# Patient Record
Sex: Female | Born: 1935 | Race: Black or African American | Hispanic: No | Smoking: Never smoker
Health system: Southern US, Community
[De-identification: ages and names within clinical notes are randomized; demographics above are authoritative.]

## PROBLEM LIST (undated history)

## (undated) DIAGNOSIS — M1990 Unspecified osteoarthritis, unspecified site: Secondary | ICD-10-CM

## (undated) DIAGNOSIS — I10 Essential (primary) hypertension: Secondary | ICD-10-CM

## (undated) HISTORY — PX: ABDOMINAL HYSTERECTOMY: SHX81

---

## 2000-05-24 ENCOUNTER — Other Ambulatory Visit: Admission: RE | Admit: 2000-05-24 | Discharge: 2000-05-24 | Payer: Self-pay | Admitting: Family Medicine

## 2000-06-21 ENCOUNTER — Encounter: Admission: RE | Admit: 2000-06-21 | Discharge: 2000-06-21 | Payer: Self-pay | Admitting: Family Medicine

## 2000-06-21 ENCOUNTER — Encounter: Payer: Self-pay | Admitting: Family Medicine

## 2001-06-23 ENCOUNTER — Encounter: Payer: Self-pay | Admitting: Family Medicine

## 2001-06-23 ENCOUNTER — Encounter: Admission: RE | Admit: 2001-06-23 | Discharge: 2001-06-23 | Payer: Self-pay | Admitting: Family Medicine

## 2002-08-17 ENCOUNTER — Encounter: Payer: Self-pay | Admitting: Family Medicine

## 2002-08-17 ENCOUNTER — Encounter: Admission: RE | Admit: 2002-08-17 | Discharge: 2002-08-17 | Payer: Self-pay | Admitting: Family Medicine

## 2003-09-20 ENCOUNTER — Encounter: Admission: RE | Admit: 2003-09-20 | Discharge: 2003-09-20 | Payer: Self-pay | Admitting: Family Medicine

## 2004-10-03 ENCOUNTER — Encounter: Admission: RE | Admit: 2004-10-03 | Discharge: 2004-10-03 | Payer: Self-pay | Admitting: Family Medicine

## 2005-10-24 ENCOUNTER — Encounter: Admission: RE | Admit: 2005-10-24 | Discharge: 2005-10-24 | Payer: Self-pay | Admitting: Family Medicine

## 2006-03-26 ENCOUNTER — Other Ambulatory Visit: Admission: RE | Admit: 2006-03-26 | Discharge: 2006-03-26 | Payer: Self-pay | Admitting: Family Medicine

## 2006-11-06 ENCOUNTER — Encounter: Admission: RE | Admit: 2006-11-06 | Discharge: 2006-11-06 | Payer: Self-pay | Admitting: Family Medicine

## 2007-11-20 ENCOUNTER — Encounter: Admission: RE | Admit: 2007-11-20 | Discharge: 2007-11-20 | Payer: Self-pay | Admitting: Family Medicine

## 2008-11-24 ENCOUNTER — Encounter: Admission: RE | Admit: 2008-11-24 | Discharge: 2008-11-24 | Payer: Self-pay | Admitting: Family Medicine

## 2009-12-07 ENCOUNTER — Encounter: Admission: RE | Admit: 2009-12-07 | Discharge: 2009-12-07 | Payer: Self-pay | Admitting: Family Medicine

## 2010-12-05 ENCOUNTER — Other Ambulatory Visit: Payer: Self-pay | Admitting: Family Medicine

## 2010-12-05 DIAGNOSIS — Z1231 Encounter for screening mammogram for malignant neoplasm of breast: Secondary | ICD-10-CM

## 2010-12-11 ENCOUNTER — Ambulatory Visit
Admission: RE | Admit: 2010-12-11 | Discharge: 2010-12-11 | Disposition: A | Discharge status: Home / Self Care | Payer: BC Managed Care – PPO | Source: Ambulatory Visit | Attending: Family Medicine | Admitting: Family Medicine

## 2010-12-11 DIAGNOSIS — Z1231 Encounter for screening mammogram for malignant neoplasm of breast: Secondary | ICD-10-CM

## 2011-01-25 ENCOUNTER — Encounter: Payer: Self-pay | Admitting: Podiatry

## 2011-01-25 DIAGNOSIS — E119 Type 2 diabetes mellitus without complications: Secondary | ICD-10-CM | POA: Insufficient documentation

## 2011-08-06 DIAGNOSIS — N181 Chronic kidney disease, stage 1: Secondary | ICD-10-CM | POA: Diagnosis not present

## 2011-08-06 DIAGNOSIS — I10 Essential (primary) hypertension: Secondary | ICD-10-CM | POA: Diagnosis not present

## 2011-08-06 DIAGNOSIS — E785 Hyperlipidemia, unspecified: Secondary | ICD-10-CM | POA: Diagnosis not present

## 2011-08-06 DIAGNOSIS — E1129 Type 2 diabetes mellitus with other diabetic kidney complication: Secondary | ICD-10-CM | POA: Diagnosis not present

## 2011-08-07 ENCOUNTER — Emergency Department (HOSPITAL_COMMUNITY): Payer: No Typology Code available for payment source

## 2011-08-07 ENCOUNTER — Emergency Department (HOSPITAL_COMMUNITY)
Admission: EM | Admit: 2011-08-07 | Discharge: 2011-08-07 | Disposition: A | Discharge status: Home / Self Care | Payer: No Typology Code available for payment source | Attending: Emergency Medicine | Admitting: Emergency Medicine

## 2011-08-07 DIAGNOSIS — R109 Unspecified abdominal pain: Secondary | ICD-10-CM | POA: Insufficient documentation

## 2011-08-07 DIAGNOSIS — I10 Essential (primary) hypertension: Secondary | ICD-10-CM | POA: Insufficient documentation

## 2011-08-07 DIAGNOSIS — M545 Low back pain, unspecified: Secondary | ICD-10-CM | POA: Diagnosis not present

## 2011-08-07 DIAGNOSIS — R079 Chest pain, unspecified: Secondary | ICD-10-CM | POA: Insufficient documentation

## 2011-08-07 DIAGNOSIS — E119 Type 2 diabetes mellitus without complications: Secondary | ICD-10-CM | POA: Insufficient documentation

## 2011-08-07 DIAGNOSIS — S335XXA Sprain of ligaments of lumbar spine, initial encounter: Secondary | ICD-10-CM | POA: Insufficient documentation

## 2011-08-07 DIAGNOSIS — S39012A Strain of muscle, fascia and tendon of lower back, initial encounter: Secondary | ICD-10-CM

## 2011-08-07 HISTORY — DX: Essential (primary) hypertension: I10

## 2011-08-07 LAB — POCT I-STAT, CHEM 8
Calcium, Ion: 1.24 mmol/L (ref 1.12–1.32)
Glucose, Bld: 166 mg/dL — ABNORMAL HIGH (ref 70–99)
HCT: 44 % (ref 36.0–46.0)
TCO2: 29 mmol/L (ref 0–100)

## 2011-08-07 LAB — GLUCOSE, CAPILLARY: Glucose-Capillary: 124 mg/dL — ABNORMAL HIGH (ref 70–99)

## 2011-08-07 LAB — CBC
MCH: 29.1 pg (ref 26.0–34.0)
MCHC: 33.3 g/dL (ref 30.0–36.0)
MCV: 87.6 fL (ref 78.0–100.0)
Platelets: 209 10*3/uL (ref 150–400)
RDW: 13.6 % (ref 11.5–15.5)

## 2011-08-07 LAB — DIFFERENTIAL
Basophils Relative: 1 % (ref 0–1)
Eosinophils Absolute: 0.3 10*3/uL (ref 0.0–0.7)
Eosinophils Relative: 4 % (ref 0–5)
Neutrophils Relative %: 42 % — ABNORMAL LOW (ref 43–77)

## 2011-08-07 LAB — PROTIME-INR
INR: 0.98 (ref 0.00–1.49)
Prothrombin Time: 13.2 seconds (ref 11.6–15.2)

## 2011-08-07 MED ORDER — IOHEXOL 300 MG/ML  SOLN
100.0000 mL | Freq: Once | INTRAMUSCULAR | Status: AC | PRN
  Administered 2011-08-07: 100 mL via INTRAVENOUS
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
## 2011-08-07 NOTE — ED Notes (Signed)
Patient transported to RADIOLOGY

## 2011-08-07 NOTE — ED Notes (Signed)
See trauma narrator 

## 2011-08-07 NOTE — ED Notes (Signed)
Pt returned from radiology.
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
## 2011-08-07 NOTE — Progress Notes (Signed)
Trauma page: chaplain spoke briefly with patient who stated that she is coping okay. Chaplain offered to contact any friends or family on her behalf, but patient said she had already done so. Follow up as needed.

## 2011-08-07 NOTE — ED Notes (Signed)
Unable per pt none local.
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
## 2011-08-07 NOTE — ED Notes (Signed)
Patient ambulated to restroom nad noted, returned from ct/xray. Vs as documented. Sitting on side of bed, nad noted,

## 2011-08-16 DIAGNOSIS — B351 Tinea unguium: Secondary | ICD-10-CM | POA: Diagnosis not present

## 2011-11-07 DIAGNOSIS — A088 Other specified intestinal infections: Secondary | ICD-10-CM | POA: Diagnosis not present

## 2011-11-07 DIAGNOSIS — Z1331 Encounter for screening for depression: Secondary | ICD-10-CM | POA: Diagnosis not present

## 2011-11-09 ENCOUNTER — Other Ambulatory Visit: Payer: Self-pay | Admitting: Family Medicine

## 2011-11-09 DIAGNOSIS — Z1231 Encounter for screening mammogram for malignant neoplasm of breast: Secondary | ICD-10-CM

## 2011-11-15 DIAGNOSIS — B351 Tinea unguium: Secondary | ICD-10-CM | POA: Diagnosis not present

## 2012-01-04 ENCOUNTER — Ambulatory Visit
Admission: RE | Admit: 2012-01-04 | Discharge: 2012-01-04 | Disposition: A | Discharge status: Home / Self Care | Payer: BC Managed Care – PPO | Source: Ambulatory Visit | Attending: Family Medicine | Admitting: Family Medicine

## 2012-01-04 DIAGNOSIS — Z1231 Encounter for screening mammogram for malignant neoplasm of breast: Secondary | ICD-10-CM

## 2012-02-07 DIAGNOSIS — M79609 Pain in unspecified limb: Secondary | ICD-10-CM | POA: Diagnosis not present

## 2012-02-07 DIAGNOSIS — B351 Tinea unguium: Secondary | ICD-10-CM | POA: Diagnosis not present

## 2012-02-08 DIAGNOSIS — I129 Hypertensive chronic kidney disease with stage 1 through stage 4 chronic kidney disease, or unspecified chronic kidney disease: Secondary | ICD-10-CM | POA: Diagnosis not present

## 2012-02-08 DIAGNOSIS — N951 Menopausal and female climacteric states: Secondary | ICD-10-CM | POA: Diagnosis not present

## 2012-02-08 DIAGNOSIS — E1129 Type 2 diabetes mellitus with other diabetic kidney complication: Secondary | ICD-10-CM | POA: Diagnosis not present

## 2012-02-08 DIAGNOSIS — N181 Chronic kidney disease, stage 1: Secondary | ICD-10-CM | POA: Diagnosis not present

## 2012-02-08 DIAGNOSIS — E785 Hyperlipidemia, unspecified: Secondary | ICD-10-CM | POA: Diagnosis not present

## 2012-02-08 DIAGNOSIS — F411 Generalized anxiety disorder: Secondary | ICD-10-CM | POA: Diagnosis not present

## 2012-02-18 DIAGNOSIS — Z780 Asymptomatic menopausal state: Secondary | ICD-10-CM | POA: Diagnosis not present

## 2012-03-21 DIAGNOSIS — I10 Essential (primary) hypertension: Secondary | ICD-10-CM | POA: Diagnosis not present

## 2012-03-21 DIAGNOSIS — F411 Generalized anxiety disorder: Secondary | ICD-10-CM | POA: Diagnosis not present

## 2012-03-21 DIAGNOSIS — E785 Hyperlipidemia, unspecified: Secondary | ICD-10-CM | POA: Diagnosis not present

## 2012-05-08 DIAGNOSIS — B351 Tinea unguium: Secondary | ICD-10-CM | POA: Diagnosis not present

## 2012-06-12 DIAGNOSIS — E119 Type 2 diabetes mellitus without complications: Secondary | ICD-10-CM | POA: Diagnosis not present

## 2012-08-28 DIAGNOSIS — B351 Tinea unguium: Secondary | ICD-10-CM | POA: Diagnosis not present

## 2012-09-26 DIAGNOSIS — E785 Hyperlipidemia, unspecified: Secondary | ICD-10-CM | POA: Diagnosis not present

## 2012-09-26 DIAGNOSIS — N181 Chronic kidney disease, stage 1: Secondary | ICD-10-CM | POA: Diagnosis not present

## 2012-09-26 DIAGNOSIS — Z23 Encounter for immunization: Secondary | ICD-10-CM | POA: Diagnosis not present

## 2012-09-26 DIAGNOSIS — I129 Hypertensive chronic kidney disease with stage 1 through stage 4 chronic kidney disease, or unspecified chronic kidney disease: Secondary | ICD-10-CM | POA: Diagnosis not present

## 2012-09-26 DIAGNOSIS — F411 Generalized anxiety disorder: Secondary | ICD-10-CM | POA: Diagnosis not present

## 2012-09-26 DIAGNOSIS — E1129 Type 2 diabetes mellitus with other diabetic kidney complication: Secondary | ICD-10-CM | POA: Diagnosis not present

## 2012-11-24 DIAGNOSIS — B351 Tinea unguium: Secondary | ICD-10-CM | POA: Diagnosis not present

## 2012-11-24 DIAGNOSIS — M79609 Pain in unspecified limb: Secondary | ICD-10-CM | POA: Diagnosis not present

## 2012-12-24 DIAGNOSIS — E119 Type 2 diabetes mellitus without complications: Secondary | ICD-10-CM | POA: Diagnosis not present

## 2012-12-30 ENCOUNTER — Other Ambulatory Visit: Payer: Self-pay

## 2012-12-30 DIAGNOSIS — Z1231 Encounter for screening mammogram for malignant neoplasm of breast: Secondary | ICD-10-CM

## 2013-02-02 ENCOUNTER — Ambulatory Visit
Admission: RE | Admit: 2013-02-02 | Discharge: 2013-02-02 | Disposition: A | Discharge status: Home / Self Care | Payer: BC Managed Care – PPO | Source: Ambulatory Visit

## 2013-02-02 DIAGNOSIS — Z1231 Encounter for screening mammogram for malignant neoplasm of breast: Secondary | ICD-10-CM

## 2013-02-23 DIAGNOSIS — B351 Tinea unguium: Secondary | ICD-10-CM | POA: Diagnosis not present

## 2013-02-23 DIAGNOSIS — M79609 Pain in unspecified limb: Secondary | ICD-10-CM | POA: Diagnosis not present

## 2013-03-20 DIAGNOSIS — E785 Hyperlipidemia, unspecified: Secondary | ICD-10-CM | POA: Diagnosis not present

## 2013-03-20 DIAGNOSIS — N181 Chronic kidney disease, stage 1: Secondary | ICD-10-CM | POA: Diagnosis not present

## 2013-03-20 DIAGNOSIS — E1129 Type 2 diabetes mellitus with other diabetic kidney complication: Secondary | ICD-10-CM | POA: Diagnosis not present

## 2013-03-20 DIAGNOSIS — F329 Major depressive disorder, single episode, unspecified: Secondary | ICD-10-CM | POA: Diagnosis not present

## 2013-03-20 DIAGNOSIS — I129 Hypertensive chronic kidney disease with stage 1 through stage 4 chronic kidney disease, or unspecified chronic kidney disease: Secondary | ICD-10-CM | POA: Diagnosis not present

## 2013-03-20 DIAGNOSIS — Z23 Encounter for immunization: Secondary | ICD-10-CM | POA: Diagnosis not present

## 2013-05-18 ENCOUNTER — Encounter: Payer: Self-pay | Admitting: Podiatry

## 2013-05-18 ENCOUNTER — Ambulatory Visit (INDEPENDENT_AMBULATORY_CARE_PROVIDER_SITE_OTHER): Payer: Medicare Other | Admitting: Podiatry

## 2013-05-18 VITALS — BP 159/79 | HR 70 | Resp 16

## 2013-05-18 DIAGNOSIS — B351 Tinea unguium: Secondary | ICD-10-CM | POA: Diagnosis not present

## 2013-05-18 DIAGNOSIS — M79609 Pain in unspecified limb: Secondary | ICD-10-CM

## 2013-05-18 NOTE — Progress Notes (Signed)
Subjective:     Patient ID: Kirsten Kelly, female   DOB: 03-21-1928, 79 y.o.   MRN: [id number]  HPI my nails are thick and painful and I cannot cut them   Review of Systems     Objective:   Physical Exam     Assessment:      thick mycotic nail infections with pain 1-5 bilateral Plan:     Debridement nailbeds 1-5 bilateral no iatrogenic bleeding noted

## 2013-07-29 DIAGNOSIS — E119 Type 2 diabetes mellitus without complications: Secondary | ICD-10-CM | POA: Diagnosis not present

## 2013-07-29 DIAGNOSIS — H2510 Age-related nuclear cataract, unspecified eye: Secondary | ICD-10-CM | POA: Diagnosis not present

## 2013-08-12 ENCOUNTER — Ambulatory Visit: Payer: Medicare Other | Admitting: Podiatry

## 2013-08-27 ENCOUNTER — Ambulatory Visit: Payer: Medicare Other | Admitting: Podiatry

## 2013-08-31 ENCOUNTER — Telehealth: Payer: Self-pay | Admitting: *Deleted

## 2013-08-31 NOTE — Telephone Encounter (Signed)
Spoke to pt to confirm appt time

## 2013-08-31 NOTE — Telephone Encounter (Signed)
Pt asked the time of her appt 09/03/2013.

## 2013-09-03 ENCOUNTER — Ambulatory Visit (INDEPENDENT_AMBULATORY_CARE_PROVIDER_SITE_OTHER): Payer: Medicare Other | Admitting: Podiatry

## 2013-09-03 ENCOUNTER — Encounter: Payer: Self-pay | Admitting: Podiatry

## 2013-09-03 VITALS — BP 193/93 | HR 79 | Resp 16

## 2013-09-03 DIAGNOSIS — M79609 Pain in unspecified limb: Secondary | ICD-10-CM | POA: Diagnosis not present

## 2013-09-03 DIAGNOSIS — B351 Tinea unguium: Secondary | ICD-10-CM

## 2013-09-03 NOTE — Patient Instructions (Signed)
Diabetes and Foot Care Diabetes may cause you to have problems because of poor blood supply (circulation) to your feet and legs. This may cause the skin on your feet to become thinner, break easier, and heal more slowly. Your skin may become dry, and the skin may peel and crack. You may also have nerve damage in your legs and feet causing decreased feeling in them. You may not notice minor injuries to your feet that could lead to infections or more serious problems. Taking care of your feet is one of the most important things you can do for yourself.  HOME CARE INSTRUCTIONS  Wear shoes at all times, even in the house. Do not go barefoot. Bare feet are easily injured.  Check your feet daily for blisters, cuts, and redness. If you cannot see the bottom of your feet, use a mirror or ask someone for help.  Wash your feet with warm water (do not use hot water) and mild soap. Then pat your feet and the areas between your toes until they are completely dry. Do not soak your feet as this can dry your skin.  Apply a moisturizing lotion or petroleum jelly (that does not contain alcohol and is unscented) to the skin on your feet and to dry, brittle toenails. Do not apply lotion between your toes.  Trim your toenails straight across. Do not dig under them or around the cuticle. File the edges of your nails with an emery board or nail file.  Do not cut corns or calluses or try to remove them with medicine.  Wear clean socks or stockings every day. Make sure they are not too tight. Do not wear knee-high stockings since they may decrease blood flow to your legs.  Wear shoes that fit properly and have enough cushioning. To break in new shoes, wear them for just a few hours a day. This prevents you from injuring your feet. Always look in your shoes before you put them on to be sure there are no objects inside.  Do not cross your legs. This may decrease the blood flow to your feet.  If you find a minor scrape,  cut, or break in the skin on your feet, keep it and the skin around it clean and dry. These areas may be cleansed with mild soap and water. Do not cleanse the area with peroxide, alcohol, or iodine.  When you remove an adhesive bandage, be sure not to damage the skin around it.  If you have a wound, look at it several times a day to make sure it is healing.  Do not use heating pads or hot water bottles. They may burn your skin. If you have lost feeling in your feet or legs, you may not know it is happening until it is too late.  Make sure your health care provider performs a complete foot exam at least annually or more often if you have foot problems. Report any cuts, sores, or bruises to your health care provider immediately. SEEK MEDICAL CARE IF:   You have an injury that is not healing.  You have cuts or breaks in the skin.  You have an ingrown nail.  You notice redness on your legs or feet.  You feel burning or tingling in your legs or feet.  You have pain or cramps in your legs and feet.  Your legs or feet are numb.  Your feet always feel cold. SEEK IMMEDIATE MEDICAL CARE IF:   There is increasing redness,   swelling, or pain in or around a wound.  There is a red line that goes up your leg.  Pus is coming from a wound.  You develop a fever or as directed by your health care provider.  You notice a bad smell coming from an ulcer or wound. Document Released: 07/13/2000 Document Revised: 03/18/2013 Document Reviewed: 12/23/2012 Exit[employer] Patient Information 2014 [employer].  

## 2013-09-03 NOTE — Progress Notes (Signed)
Subjective:     Patient ID: Kirsten Kelly, female   DOB: 10-26-1927, 80 y.o.   MRN: [id number]  HPI patient presents with thick painful nailbeds 1-5 both feet that she can not cut herself   Review of Systems     Objective:   Physical Exam Neurovascular status unchanged with thick painful nail bed 1-5 both feet    Assessment:     Mycotic nail infection with pain 1-5 both feet    Plan:     Debridement painful nail bed 1-5 both feet with no iatrogenic bleeding noted

## 2013-11-23 DIAGNOSIS — N181 Chronic kidney disease, stage 1: Secondary | ICD-10-CM | POA: Diagnosis not present

## 2013-11-23 DIAGNOSIS — F329 Major depressive disorder, single episode, unspecified: Secondary | ICD-10-CM | POA: Diagnosis not present

## 2013-11-23 DIAGNOSIS — E785 Hyperlipidemia, unspecified: Secondary | ICD-10-CM | POA: Diagnosis not present

## 2013-11-23 DIAGNOSIS — E1129 Type 2 diabetes mellitus with other diabetic kidney complication: Secondary | ICD-10-CM | POA: Diagnosis not present

## 2013-11-23 DIAGNOSIS — I129 Hypertensive chronic kidney disease with stage 1 through stage 4 chronic kidney disease, or unspecified chronic kidney disease: Secondary | ICD-10-CM | POA: Diagnosis not present

## 2013-11-23 DIAGNOSIS — R946 Abnormal results of thyroid function studies: Secondary | ICD-10-CM | POA: Diagnosis not present

## 2013-11-23 DIAGNOSIS — Z23 Encounter for immunization: Secondary | ICD-10-CM | POA: Diagnosis not present

## 2013-12-03 ENCOUNTER — Ambulatory Visit (INDEPENDENT_AMBULATORY_CARE_PROVIDER_SITE_OTHER): Payer: Medicare Other | Admitting: Podiatry

## 2013-12-03 ENCOUNTER — Encounter: Payer: Self-pay | Admitting: Podiatry

## 2013-12-03 VITALS — BP 154/78 | HR 60 | Resp 17 | Ht 66.0 in | Wt 165.0 lb

## 2013-12-03 DIAGNOSIS — M79609 Pain in unspecified limb: Secondary | ICD-10-CM | POA: Diagnosis not present

## 2013-12-03 DIAGNOSIS — B351 Tinea unguium: Secondary | ICD-10-CM | POA: Diagnosis not present

## 2013-12-03 NOTE — Progress Notes (Signed)
Subjective:     Patient ID: Kirsten Kelly, female   DOB: Nov 13, 1927, 80 y.o.   MRN: [id number]  HPI patient is found to have thick nailbeds 1-5 both feet that she cannot cut herself and they are pain full   Review of Systems     Objective:   Physical Exam Neurovascular status is intact and patient is well oriented x3 with thick incurvated nailbeds that are brittle and painful 1-5 both feet    Assessment:     Mycotic nail infection with pain 1-5 both feet    Plan:     Debris painful nailbeds 1-5 both feet with no iatrogenic bleeding noted

## 2014-02-01 ENCOUNTER — Other Ambulatory Visit: Payer: Self-pay

## 2014-02-01 DIAGNOSIS — Z1231 Encounter for screening mammogram for malignant neoplasm of breast: Secondary | ICD-10-CM

## 2014-02-11 DIAGNOSIS — E119 Type 2 diabetes mellitus without complications: Secondary | ICD-10-CM | POA: Diagnosis not present

## 2014-02-11 DIAGNOSIS — H2510 Age-related nuclear cataract, unspecified eye: Secondary | ICD-10-CM | POA: Diagnosis not present

## 2014-02-17 ENCOUNTER — Ambulatory Visit: Payer: BC Managed Care – PPO

## 2014-02-18 ENCOUNTER — Encounter (INDEPENDENT_AMBULATORY_CARE_PROVIDER_SITE_OTHER): Payer: Self-pay

## 2014-02-18 ENCOUNTER — Ambulatory Visit
Admission: RE | Admit: 2014-02-18 | Discharge: 2014-02-18 | Disposition: A | Discharge status: Home / Self Care | Payer: BC Managed Care – PPO | Source: Ambulatory Visit

## 2014-02-18 DIAGNOSIS — Z1231 Encounter for screening mammogram for malignant neoplasm of breast: Secondary | ICD-10-CM

## 2014-02-23 DIAGNOSIS — R946 Abnormal results of thyroid function studies: Secondary | ICD-10-CM | POA: Diagnosis not present

## 2014-03-04 ENCOUNTER — Ambulatory Visit (INDEPENDENT_AMBULATORY_CARE_PROVIDER_SITE_OTHER): Payer: Medicare Other | Admitting: Podiatry

## 2014-03-04 DIAGNOSIS — M79609 Pain in unspecified limb: Secondary | ICD-10-CM | POA: Diagnosis not present

## 2014-03-04 DIAGNOSIS — B351 Tinea unguium: Secondary | ICD-10-CM | POA: Diagnosis not present

## 2014-03-04 DIAGNOSIS — M79673 Pain in unspecified foot: Secondary | ICD-10-CM

## 2014-03-04 NOTE — Progress Notes (Signed)
Subjective:     Patient ID: Kirsten Kelly, female   DOB: Jul 30, 1928, 80 y.o.   MRN: [id number]  HPI patient presents with painful nails 1-5 of both feet that she cannot cut herself   Review of Systems     Objective:   Physical Exam Neurovascular status intact with thick yellow brittle nailbeds 1-5 both feet    Assessment:     Mycotic nail infection is with pain 1-5 both feet    Plan:     Debris painful nailbeds 1-5 both feet with no iatrogenic bleeding noted

## 2014-03-04 NOTE — Progress Notes (Signed)
   Subjective:    Patient ID: Kirsten Kelly, female    DOB: Dec 25, 1927, 80 y.o.   MRN: [id number]  HPI  Pt presents for nail debridement  Review of Systems     Objective:   Physical Exam        Assessment & Plan:

## 2014-05-25 DIAGNOSIS — E784 Other hyperlipidemia: Secondary | ICD-10-CM | POA: Diagnosis not present

## 2014-05-25 DIAGNOSIS — E876 Hypokalemia: Secondary | ICD-10-CM | POA: Diagnosis not present

## 2014-05-25 DIAGNOSIS — E1121 Type 2 diabetes mellitus with diabetic nephropathy: Secondary | ICD-10-CM | POA: Diagnosis not present

## 2014-05-25 DIAGNOSIS — I129 Hypertensive chronic kidney disease with stage 1 through stage 4 chronic kidney disease, or unspecified chronic kidney disease: Secondary | ICD-10-CM | POA: Diagnosis not present

## 2014-05-25 DIAGNOSIS — N181 Chronic kidney disease, stage 1: Secondary | ICD-10-CM | POA: Diagnosis not present

## 2014-05-25 DIAGNOSIS — F325 Major depressive disorder, single episode, in full remission: Secondary | ICD-10-CM | POA: Diagnosis not present

## 2014-05-25 DIAGNOSIS — R296 Repeated falls: Secondary | ICD-10-CM | POA: Diagnosis not present

## 2014-06-07 ENCOUNTER — Ambulatory Visit (INDEPENDENT_AMBULATORY_CARE_PROVIDER_SITE_OTHER): Payer: Medicare Other | Admitting: Podiatry

## 2014-06-07 DIAGNOSIS — M79673 Pain in unspecified foot: Secondary | ICD-10-CM

## 2014-06-07 DIAGNOSIS — B351 Tinea unguium: Secondary | ICD-10-CM

## 2014-06-08 ENCOUNTER — Ambulatory Visit: Payer: Medicare Other | Attending: Family Medicine

## 2014-06-08 NOTE — Progress Notes (Signed)
Subjective:     Patient ID: Kirsten Kelly, female   DOB: 12/09/1927, 80 y.o.   MRN: [id number]  HPIpatient presents with nail disease 1-5 both feet that are painful and she cannot cut   Review of Systems     Objective:   Physical Exam Neurovascular status unchanged with thick yellow brittle nailbeds 1-5 both feet    Assessment:     Mycotic nail infection with pain 1-5 both feet    Plan:     Debride painful nailbeds 1-5 both feet with no iatrogenic bleeding noted

## 2014-07-12 ENCOUNTER — Ambulatory Visit: Payer: Medicare Other | Attending: Family Medicine | Admitting: Physical Therapy

## 2014-07-12 ENCOUNTER — Encounter: Payer: Self-pay | Admitting: Physical Therapy

## 2014-07-12 DIAGNOSIS — R531 Weakness: Secondary | ICD-10-CM | POA: Diagnosis not present

## 2014-07-12 DIAGNOSIS — R6889 Other general symptoms and signs: Secondary | ICD-10-CM | POA: Diagnosis not present

## 2014-07-12 DIAGNOSIS — Z9181 History of falling: Secondary | ICD-10-CM | POA: Diagnosis not present

## 2014-07-12 DIAGNOSIS — R269 Unspecified abnormalities of gait and mobility: Secondary | ICD-10-CM | POA: Insufficient documentation

## 2014-07-12 NOTE — Therapy (Signed)
[facility] [address] [place], [place], [address] Phone: [phone]   Fax:  [phone]  Physical Therapy Evaluation  Patient Details  Name: Kirsten Kelly MRN: [id number] Date of Birth: 06/03/1928  Encounter Date: 07/12/2014      PT End of Session - 07/12/14 1219    Visit Number 1   Number of Visits 17   Date for PT Re-Evaluation 09/11/14   PT Start Time 1120   PT Stop Time 1200   PT Time Calculation (min) 40 min   Equipment Utilized During Treatment Gait belt   Activity Tolerance Patient tolerated treatment well   Behavior During Therapy [facility] for tasks assessed/performed      Past Medical History  Diagnosis Date  . Hypertension   . Diabetes mellitus     Past Surgical History  Procedure Laterality Date  . Abdominal hysterectomy      There were no vitals taken for this visit.  Visit Diagnosis:  Abnormality of gait  Weakness generalized  Activity intolerance      Subjective Assessment - 07/12/14 1137    Symptoms This 80yo female presents to PT for evaluation due to recurrent falls. No major injuries. She went to doctor 06/09/14 & was referred for PT evaluation.   Patient Stated Goals To stop falls & be mobile in community   Currently in Pain? No/denies          [facility] PT Assessment - 07/12/14 1100    Assessment   Medical Diagnosis Falls / Gait abnormality   Onset Date 06/09/14   Precautions   Precautions Fall   Restrictions   Weight Bearing Restrictions No   Balance Screen   Has the patient fallen in the past 6 months Yes   How many times? 5  stumbles, feet can't keep up   Has the patient had a decrease in activity level because of a fear of falling?  No   Is the patient reluctant to leave their home because of a fear of falling?  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   Observation/Other Assessments   Focus on Therapeutic Outcomes (FOTO)  56  Functional Status   Fear Avoidance Belief Questionnaire (FABQ)  26   Posture/Postural Control   Posture/Postural Control Postural limitations   Postural Limitations Rounded Shoulders;Forward head   Strength   Right Hip Flexion 4/5   Right Hip Extension 3-/5   Right Hip ABduction 3+/5   Left Hip Flexion 4/5   Left Hip Extension 3/5   Left Hip ABduction 3+/5   Right Knee Flexion 4/5   Right Knee Extension 4/5   Left Knee Flexion 4/5   Left Knee Extension 4/5   Right Ankle Dorsiflexion 4/5   Right Ankle Plantar Flexion 2/5   Left Ankle Dorsiflexion 4/5   Left Ankle Plantar Flexion 2/5   Transfers   Sit to Stand 4: Min guard  uses back of legs against chair to stabilize   Stand to Sit 6: Modified independent (Device/Increase time);With upper extremity assist  Ambulation/Gait   Ambulation/Gait Yes   Ambulation/Gait Assistance 5: Supervision   Ambulation/Gait Assistance Details weakness in gastroc noted   Ambulation Distance (Feet) 200 Feet   Assistive device None   Gait Pattern Step-through pattern;Decreased stride length;Poor foot clearance - left;Poor foot clearance - right   Gait velocity 3.60 ft/sec   Stairs Yes   Stairs Assistance 5: Supervision   Stair Management Technique Two rails;Alternating pattern   Number of Stairs 4   Berg Balance Test   Sit to Stand Needs minimal aid to stand or to stabilize   Standing Unsupported Able to stand safely 2 minutes   Sitting with Back Unsupported but Feet Supported on Floor or Stool Able to sit safely and securely 2  minutes   Stand to Sit Sits safely with minimal use of hands   Transfers Able to transfer safely, minor use of hands   Standing Unsupported with Eyes Closed Able to stand 10 seconds with supervision   Standing Ubsupported with Feet Together Able to place feet together independently and stand 1 minute safely   From Standing, Reach Forward with Outstretched Arm Can reach confidently >25 cm (10")   From Standing Position, Pick up Object from Floor Able to pick up shoe safely and easily   From Standing Position, Turn to Look Behind Over each Shoulder Looks behind from both sides and weight shifts well   Turn 360 Degrees Able to turn 360 degrees safely in 4 seconds or less   Standing Unsupported, Alternately Place Feet on Step/Stool Able to stand independently and safely and complete 8 steps in 20 seconds   Standing Unsupported, One Foot in Front Able to plae foot ahead of the other independently and hold 30 seconds   Standing on One Leg Tries to lift leg/unable to hold 3 seconds but remains standing independently   Total Score 48   Dynamic Gait Index   Level Surface Mild Impairment   Change in Gait Speed Moderate Impairment   Gait with Horizontal Head Turns Mild Impairment   Gait with Vertical Head Turns Mild Impairment   Gait and Pivot Turn Mild Impairment   Step Over Obstacle Mild Impairment   Step Around Obstacles Mild Impairment   Steps Mild Impairment   Total Score 15   Timed Up and Go Test   Normal TUG (seconds) 9.12            PT Education - 07/12/14 1219    Education provided Yes   Education Details need for exercises   Person(s) Educated Patient   Methods Explanation   Comprehension Verbalized understanding              Plan - 07/12/14 1223    Clinical Impression Statement This 80yo has had recurrent falls with only minor skin issues. She has weakness in hip, knee & ankle muscles espcially gastroc. She tests at high  risk of falls with Dynamic Gait Index 15/24  (<19/24) and mild fall risk with Berg Balance 48/56. She would benefit from skilled PT.   Pt will benefit from skilled therapeutic intervention in order to improve on the following deficits Abnormal gait;Decreased activity tolerance;Decreased balance;Decreased mobility;Decreased strength   Rehab Potential Good   PT Frequency 2x / week   PT Duration 8 weeks   PT Treatment/Interventions ADLs/Self Care Home Management;Gait training;Stair training;Functional mobility training;Therapeutic activities;Therapeutic exercise;Balance training;Neuromuscular re-education;Patient/family education   PT Next Visit Plan HEP   PT Home Exercise Plan Strength & balance   Consulted and Agree with Plan  of Care Patient          G-Codes - 07/12/14 1231    Functional Assessment Tool Used Berg Balance 48/56, Dynamic Gait Index 15/24   Functional Limitation Mobility: Walking and moving around   Mobility: Walking and Moving Around Current Status [phone]([id number]) At least 20 percent but less than 40 percent impaired, limited or restricted   Mobility: Walking and Moving Around Goal Status [phone]([id number]) At least 1 percent but less than 20 percent impaired, limited or restricted         Problem List Patient Active Problem List   Diagnosis Date Noted  . Diabetes mellitus 01/25/2011    Zandria Woldt 07/12/2014, 12:32 PM    Vladimir Fasterobin Corinthian Mizrahi, PT, DPT PT Specializing in Prosthetics & Orthotics 07/12/2014 12:33 PM Phone:  [phone]  Fax:  [phone] [facility] [address] [place], [place] [address]

## 2014-08-03 ENCOUNTER — Encounter: Payer: Self-pay | Admitting: Physical Therapy

## 2014-08-03 ENCOUNTER — Ambulatory Visit: Payer: Medicare Other | Attending: Family Medicine | Admitting: Physical Therapy

## 2014-08-03 DIAGNOSIS — Z9181 History of falling: Secondary | ICD-10-CM | POA: Diagnosis not present

## 2014-08-03 DIAGNOSIS — R6889 Other general symptoms and signs: Secondary | ICD-10-CM | POA: Insufficient documentation

## 2014-08-03 DIAGNOSIS — R531 Weakness: Secondary | ICD-10-CM | POA: Diagnosis not present

## 2014-08-03 DIAGNOSIS — R269 Unspecified abnormalities of gait and mobility: Secondary | ICD-10-CM | POA: Diagnosis not present

## 2014-08-03 NOTE — Patient Instructions (Signed)
HIP: Abduction - Standing   At counter: Raise/kick leg out to side and then back in slowly. Alternate legs. 10 reps per leg, 1-2 times a day.  Copyright  VHI. All rights reserved.  Standing Hip Extension   At counter: Bring/kick leg back with knee straight. Repeat with other leg. Repeat __10__ times. Do _1-2___ sessions per day.  http://gt2.exer.us/396   Copyright  VHI. All rights reserved.  Toe / Heel Raise   At counter: Gently rock back on heels and raise toes. Then rock forward on toes and raise heels. Repeat sequence _10___ times per session. Do ___1-2_ sessions per week.  Copyright  VHI. All rights reserved.  Functional Quadriceps: Sit to Stand   Sit on edge of chair, feet flat on floor. Stand upright, extending knees fully. Repeat __10__ times per set. Do _1___ sets per session. Do _1-2___ sessions per day.  http://orth.exer.us/735   Copyright  VHI. All rights reserved.  "I love a Licensed conveyancerarade" Lift   At counter: High knee marches forward and then backwards. 3 laps each way. 1-2 times a day.  http://gt2.exer.us/345  Copyright  VHI. All rights reserved.  Walking on Toes   At counter: Walk on toes forward and then backwards while continuing on a straight path. 3 laps each way. Do __1-2__ sessions per day.  Copyright  VHI. All rights reserved.  Walking on Heels   At counter: Walk on heels forward and then backwards while continuing on a straight path. 3 laps each way. Do _1-2__ sessions per day.  Copyright  VHI. All rights reserved.  Tandem Walking   At counter: Walk a straight line forward and then backwards. 3 laps each way. 1-2 times a day.   Copyright  VHI. All rights reserved.

## 2014-08-03 NOTE — Therapy (Signed)
[facility] [address] [place], [place], [address] Phone: [phone]   Fax:  [phone]  Physical Therapy Treatment  Patient Details  Name: Kirsten Kelly MRN: [id number] Date of Birth: 10-22-1927  Encounter Date: 08/03/2014      PT End of Session - 08/03/14 1127    Visit Number 2   Number of Visits 17   Date for PT Re-Evaluation 09/11/14   PT Start Time 1100   PT Stop Time 1140   PT Time Calculation (min) 40 min   Equipment Utilized During Treatment Gait belt   Activity Tolerance Patient tolerated treatment well   Behavior During Therapy [facility] for tasks assessed/performed      Past Medical History  Diagnosis Date  . Hypertension   . Diabetes mellitus     Past Surgical History  Procedure Laterality Date  . Abdominal hysterectomy      There were no vitals taken for this visit.  Visit Diagnosis:  Abnormality of gait  Weakness generalized  Activity intolerance      Subjective Assessment - 08/03/14 1103    Symptoms No new complaints. Reports no new falls and no pain today.   Currently in Pain? No/denies           [facility] Adult PT Treatment/Exercise - 08/03/14 1323    Dynamic Standing Balance   Dynamic Standing - Balance Support No upper extremity supported;During functional activity   Dynamic Standing - Level of Assistance 4: Min assist;5: Stand by assistance   Dynamic Standing - Personal assistant board;Head turns;Head nods;Eyes open   Dynamic Standing - Comments both ways on board: with eyes open- static hold, rocks, hold with head turns/nods with cues on posture throughout.   High Level Balance   High Level Balance Activities Tandem walking;Marching forwards;Marching backwards  toe walk, heel walk    High Level Balance Comments at counter, forward/backwards x 3 laps each with occasional UE support needed on counter.                          Knee/Hip Exercises: Aerobic   Stationary  Bike Scifit x 4 extremeties level 2.0 x 10 minutes with goal >/= 50 RPM for strengthening and activity tolerance.                            Knee/Hip Exercises: Standing   Heel Raises 1 set;10 reps  heel/toe raises   Heel Raises Limitations light UE assist on counter top   Hip ADduction AROM;Strengthening;Both;1 set;10 reps  alternating legs   Hip ADduction Limitations light UE counter support   Other Standing Knee Exercises alternating hip extension x 10 reps each leg with light UE support on counter top.   Other Standing Knee Exercises sit/stands x 10 reps without UE support.            PT Education - 08/03/14 1330    Education provided Yes   Education Details HEP   Person(s) Educated Patient   Methods Explanation;Demonstration;Handout   Comprehension Verbalized understanding;Returned demonstration            Plan - 08/03/14 1127    Pt will benefit from skilled therapeutic intervention in order to improve on the following deficits Abnormal gait;Decreased activity tolerance;Decreased balance;Decreased mobility;Decreased strength   Rehab Potential Good   PT Frequency 2x / week   PT Duration 8 weeks   PT Treatment/Interventions ADLs/Self Care  Home Management;Gait training;Stair training;Functional mobility training;Therapeutic activities;Therapeutic exercise;Balance training;Neuromuscular re-education;Patient/family education   PT Next Visit Plan HEP   Consulted and Agree with Plan of Care Patient      Problem List Patient Active Problem List   Diagnosis Date Noted  . Diabetes mellitus 01/25/2011    Sallyanne KusterBury, Kathy 08/03/2014, 2:26 PM  Sallyanne KusterKathy Bury, PTA, [facility] [address], [address] [place], [place] [id number] [phone] 08/03/2014, 2:26 PM

## 2014-08-05 ENCOUNTER — Ambulatory Visit: Payer: Medicare Other | Admitting: Physical Therapy

## 2014-08-05 ENCOUNTER — Encounter: Payer: Self-pay | Admitting: Physical Therapy

## 2014-08-05 DIAGNOSIS — Z9181 History of falling: Secondary | ICD-10-CM | POA: Diagnosis not present

## 2014-08-05 DIAGNOSIS — R269 Unspecified abnormalities of gait and mobility: Secondary | ICD-10-CM

## 2014-08-05 DIAGNOSIS — R531 Weakness: Secondary | ICD-10-CM | POA: Diagnosis not present

## 2014-08-05 DIAGNOSIS — R6889 Other general symptoms and signs: Secondary | ICD-10-CM

## 2014-08-05 NOTE — Therapy (Signed)
[facility] [address] [place], [place], [address] Phone: [phone]   Fax:  [phone]  Physical Therapy Treatment  Patient Details  Name: Kirsten Kelly MRN: [id number] Date of Birth: 12/09/1927 Referring Provider:  Cala BradfordWhite, Cynthia S, MD  Encounter Date: 08/05/2014      PT End of Session - 08/05/14 1234    Visit Number 3   Number of Visits 17   Date for PT Re-Evaluation 09/11/14   PT Start Time 1018   PT Stop Time 1057   PT Time Calculation (min) 39 min   Equipment Utilized During Treatment Gait belt   Activity Tolerance Patient tolerated treatment well   Behavior During Therapy [facility] for tasks assessed/performed      Past Medical History  Diagnosis Date  . Hypertension   . Diabetes mellitus     Past Surgical History  Procedure Laterality Date  . Abdominal hysterectomy      There were no vitals taken for this visit.  Visit Diagnosis:  Abnormality of gait  Weakness generalized  Activity intolerance      Subjective Assessment - 08/05/14 1021    Symptoms Reports some back soreness from twisting it at work the other day, "almost gone" now. No falls. Was able to do HEP at home without issues.   Currently in Pain? No/denies          [facility] Adult [facility] Treatment/Exercise - 08/05/14 1023    Dynamic Standing Balance   Dynamic Standing - Balance Support No upper extremity supported;During functional activity   Dynamic Standing - Level of Assistance 4: Min assist   Dynamic Standing - Balance Activities Alternating  foot traps;Compliant surfaces   Dynamic Standing - Comments with tall cones: alternating forward toe taps, cross toe taps, forward double toe taps, cross double toe taps and flip over/up x 10 each/bil legs. on blue mat: bil toe taps to 5 cones with side stepping x 4 laps. min assist to min guard assist with all these activities.                       High Level Balance   High Level Balance  Activities Tandem walking;Marching forwards;Marching backwards  heel walk, toe walk   High Level Balance Comments at counter on blue/red mats: 3 laps each/each way with occasional UE support on counter and min guard assist to min assist for balance. All performed forward/backwards on mats.                       Knee/Hip Exercises: Aerobic   Stationary Bike Scifit x 4 extremeties level 2.0 x 10 minutes with goal >/= 50 RPM for strengthening and activity tolerance.                                     PT Short Term Goals - 08/05/14 1608    PT SHORT TERM GOAL #1   Title Sharlene MottsBerg Balance >52/56 (Target Date: 08/11/14)   Status New   PT SHORT TERM GOAL #2   Title ambulates 300' without device with cues only on deviations (Target Date: 08/11/14)   Status New   PT SHORT TERM GOAL #3   Title negotiates ramp & curb without device with supervision. (Target Date 08/11/14)   Status New           PT Long Term Goals -  08/05/14 1610    PT LONG TERM GOAL #1   Title Berg Balance >/= 54/56 (Target Date 09/11/14)   Status New   PT LONG TERM GOAL #2   Title demonstrates / verbalizes progressive fitness program / HEP correctly. (Target Date 09/11/14)   Status New   PT LONG TERM GOAL #3   Title Dynamic Gait Index >/= 19/24. (Target Date 09/11/14)   Status New   PT LONG TERM GOAL #4   Title ambulates 300' without device independently (Target Date: 09/11/14)   Status New   PT LONG TERM GOAL #5   Title negotiate ramp, curb & stairs (1 rail) independently. (Target Date 2/131/6)   Status New   Additional Long Term Goals   Additional Long Term Goals Yes   PT LONG TERM GOAL #6   Title lifts & carry items simulating work activities safely. (Target Date 09/11/14)   Status New   PT LONG TERM GOAL #7   Title improve functional status score via FOTO by >10 points (Target Date 09/11/14)   Status New           Plan - 08/05/14 1236    Clinical Impression Statement Pt making steady progress with balance  toward her goals. Reports compliance with HEP and no issues.   Pt will benefit from skilled therapeutic intervention in order to improve on the following deficits Abnormal gait;Decreased activity tolerance;Decreased balance;Decreased mobility;Decreased strength   Rehab Potential Good   PT Frequency 2x / week   PT Duration 8 weeks   PT Treatment/Interventions ADLs/Self Care Home Management;Gait training;Stair training;Functional mobility training;Therapeutic activities;Therapeutic exercise;Balance training;Neuromuscular re-education;Patient/family education   PT Next Visit Plan Continue with strengthening and balance activities   Consulted and Agree with Plan of Care Patient      Problem List Patient Active Problem List   Diagnosis Date Noted  . Diabetes mellitus 01/25/2011    Sallyanne Kuster 08/06/2014, 10:26 AM  Sallyanne Kuster, PTA, [facility] [address] [place], [place] [address] [phone] 08/06/2014, 10:26 AM

## 2014-08-10 ENCOUNTER — Encounter: Payer: Self-pay | Admitting: Physical Therapy

## 2014-08-10 ENCOUNTER — Ambulatory Visit: Payer: Medicare Other | Admitting: Physical Therapy

## 2014-08-10 DIAGNOSIS — R6889 Other general symptoms and signs: Secondary | ICD-10-CM | POA: Diagnosis not present

## 2014-08-10 DIAGNOSIS — Z9181 History of falling: Secondary | ICD-10-CM | POA: Diagnosis not present

## 2014-08-10 DIAGNOSIS — R269 Unspecified abnormalities of gait and mobility: Secondary | ICD-10-CM

## 2014-08-10 DIAGNOSIS — R531 Weakness: Secondary | ICD-10-CM | POA: Diagnosis not present

## 2014-08-10 NOTE — Therapy (Signed)
[facility] [address] [place], [place], [address] Phone: [phone]   Fax:  [phone]  Physical Therapy Treatment  Patient Details  Name: Kirsten Kelly MRN: [id number] Date of Birth: [birth year] Referring Provider:  Vidal Schwalbe, MD  Encounter Date: 08/10/2014      PT End of Session - 08/10/14 1204    Visit Number 4   Number of Visits 17   Date for PT Re-Evaluation 09/11/14   PT Start Time 1104   PT Stop Time 1148   PT Time Calculation (min) 44 min   Equipment Utilized During Treatment Gait belt      Past Medical History  Diagnosis Date  . Hypertension   . Diabetes mellitus     Past Surgical History  Procedure Laterality Date  . Abdominal hysterectomy      There were no vitals taken for this visit.  Visit Diagnosis:  Abnormality of gait      Subjective Assessment - 08/10/14 1158    Symptoms Pt. reports doing well - has not had any falls since starting therapy; cont. to feel that R leg is weaker than L leg   Patient Stated Goals To stop falls & be mobile in community   Currently in Pain? No/denies                    [facility] Adult PT Treatment/Exercise - 08/10/14 1116    Ambulation/Gait   Ambulation/Gait Yes   Ambulation Distance (Feet) 360 Feet   Assistive device None   Gait Pattern Within Functional Limits   Ramp 6: Modified independent (Device)   Curb 6: Modified independent (Device/increase time)   Dynamic Standing Balance   Dynamic Standing - Balance Support No upper extremity supported   Dynamic Standing - Level of Assistance 4: Min assist   Dynamic Standing - Balance Activities Step ups   Dynamic Standing - Comments Pt. performed balance exercises to improve single limb stance including cone taps with RLE/LLE and tipping cones over and up 2-3 reps each with each leg;  3 cones placed in 1/2 circle - tapping all 3 with each foot with CGA trying to not let foot touch floor  before touching all 3 cones;   Berg Balance Test   Sit to Stand Able to stand without using hands and stabilize independently   Standing Unsupported Able to stand safely 2 minutes   Sitting with Back Unsupported but Feet Supported on Floor or Stool Able to sit safely and securely 2 minutes   Stand to Sit Sits safely with minimal use of hands   Transfers Able to transfer safely, minor use of hands   Standing Unsupported with Eyes Closed Able to stand 10 seconds safely   Standing Ubsupported with Feet Together Able to place feet together independently and stand 1 minute safely   From Standing, Reach Forward with Outstretched Arm Can reach confidently >25 cm (10")   From Standing Position, Pick up Object from Floor Able to pick up shoe safely and easily   From Standing Position, Turn to Look Behind Over each Shoulder Looks behind from both sides and weight shifts well   Turn 360 Degrees Able to turn 360 degrees safely in 4 seconds or less   Standing Unsupported, Alternately Place Feet on Step/Stool Able to stand independently and safely and complete 8 steps in 20 seconds   Standing Unsupported, One Foot in Front Able to place foot tandem independently and hold 30 seconds  Standing on One Leg Tries to lift leg/unable to hold 3 seconds but remains standing independently   Total Score 53     Neuro Re-ed; step ups x 10 reps each leg; lifting R leg over/back of 2"x4" board with 3# weight on leg for balance  And strengthening - 10 reps with CGA Marching 50' forward and backward with CGA; tandem gait 30' x 1 and walking backward 30' x 1 with CGA   Gait; curb and ramp negotiation without device with SBA - no LOB         Step negotiation with 2 rails using a step over step sequence with S        PT Education - 08/10/14 1203    Education Details instructed pt. to concentrate on single limb stance exercise at home   Person(s) Educated Patient   Methods Explanation;Demonstration   Comprehension  Verbalized understanding          PT Short Term Goals - 08/10/14 1206    PT SHORT TERM GOAL #1   Title Merrilee Jansky Balance >52/56 (Target Date: 08/11/14)   Baseline met 08-10-14 score 53/56   Status Achieved   PT SHORT TERM GOAL #2   Title ambulates 300' without device with cues only on deviations (Target Date: 08/11/14)   Status Achieved   PT SHORT TERM GOAL #3   Title negotiates ramp & curb without device with supervision. (Target Date 08/11/14)   Status Achieved           PT Long Term Goals - 08/05/14 1610    PT LONG TERM GOAL #1   Title Berg Balance >/= 54/56 (Target Date 09/11/14)   Status New   PT LONG TERM GOAL #2   Title demonstrates / verbalizes progressive fitness program / HEP correctly. (Target Date 09/11/14)   Status New   PT LONG TERM GOAL #3   Title Dynamic Gait Index >/= 19/24. (Target Date 09/11/14)   Status New   PT LONG TERM GOAL #4   Title ambulates 300' without device independently (Target Date: 09/11/14)   Status New   PT LONG TERM GOAL #5   Title negotiate ramp, curb & stairs (1 rail) independently. (Target Date 2/131/6)   Status New   Additional Long Term Goals   Additional Long Term Goals Yes   PT LONG TERM GOAL #6   Title lifts & carry items simulating work activities safely. (Target Date 09/11/14)   Status New   PT LONG TERM GOAL #7   Title improve functional status score via FOTO by >10 points (Target Date 09/11/14)   Status New               Plan - 08/10/14 1204    Clinical Impression Statement ;may be ready for D/C earlier than anticipated date   Pt will benefit from skilled therapeutic intervention in order to improve on the following deficits Abnormal gait;Decreased activity tolerance;Decreased balance;Decreased mobility;Decreased strength   Rehab Potential Good   PT Frequency 2x / week   PT Duration 8 weeks   PT Treatment/Interventions ADLs/Self Care Home Management;Gait training;Stair training;Functional mobility training;Therapeutic  activities;Therapeutic exercise;Balance training;Neuromuscular re-education;Patient/family education   PT Next Visit Plan Continue with strengthening and balance activities   PT Home Exercise Plan Strength & balance   Consulted and Agree with Plan of Care Patient        Problem List Patient Active Problem List   Diagnosis Date Noted  . Diabetes mellitus 01/25/2011    Jhayla Podgorski, [place],  PT 08/10/2014, 12:08 PM  [facility] [address] [place], [place], [address] Phone: [phone]   Fax:  [phone]

## 2014-08-12 ENCOUNTER — Ambulatory Visit: Payer: Medicare Other | Admitting: Physical Therapy

## 2014-08-12 ENCOUNTER — Encounter: Payer: Self-pay | Admitting: Physical Therapy

## 2014-08-12 DIAGNOSIS — Z9181 History of falling: Secondary | ICD-10-CM | POA: Diagnosis not present

## 2014-08-12 DIAGNOSIS — R6889 Other general symptoms and signs: Secondary | ICD-10-CM | POA: Diagnosis not present

## 2014-08-12 DIAGNOSIS — R269 Unspecified abnormalities of gait and mobility: Secondary | ICD-10-CM

## 2014-08-12 DIAGNOSIS — R531 Weakness: Secondary | ICD-10-CM | POA: Diagnosis not present

## 2014-08-13 NOTE — Therapy (Signed)
[facility] [address] [place], [place], [address] Phone: [phone]   Fax:  [phone]  Physical Therapy Treatment  Patient Details  Name: STACEE EARP MRN: [id number] Date of Birth: 05/10/1928 Referring Provider:  Vidal Schwalbe, MD  Encounter Date: 08/12/2014      PT End of Session - 08/12/14 1023    Visit Number 5   Number of Visits 17   Date for PT Re-Evaluation 09/11/14   PT Start Time 1020   PT Stop Time 1100   PT Time Calculation (min) 40 min   Equipment Utilized During Treatment Gait belt   Activity Tolerance Patient tolerated treatment well   Behavior During Therapy [facility] for tasks assessed/performed      Past Medical History  Diagnosis Date  . Hypertension   . Diabetes mellitus     Past Surgical History  Procedure Laterality Date  . Abdominal hysterectomy      There were no vitals taken for this visit.  Visit Diagnosis:  Abnormality of gait  Weakness generalized  Activity intolerance      Subjective Assessment - 08/12/14 1023    Symptoms No new complaints. Reports no falls or pain today. HEP going well.   Currently in Pain? No/denies      Treatment: Neuro Re-ed: - red mats at counter: high knee marches, toe walk, heel walk, tandem walk. All forward/backwards x 3 laps each/each way with min guard to min assist. - cones in a line at edge of red mats: bil toe taps to each cone with side stepping left/right x 2 laps each way, bil double toe taps to each cone with side stepping x 2 laps each way, flip over/up with bil feet to each cone x 2 laps each way. All with up to min assist for balance.  There-ex: Scifit x 4 extremities level 2.5 x 8 minutes with goal rpm >/= 65 for strengthening and activity tolerance.          PT Short Term Goals - 08/10/14 1206    PT SHORT TERM GOAL #1   Title Merrilee Jansky Balance >52/56 (Target Date: 08/11/14)   Baseline met 08-10-14 score 53/56   Status  Achieved   PT SHORT TERM GOAL #2   Title ambulates 300' without device with cues only on deviations (Target Date: 08/11/14)   Status Achieved   PT SHORT TERM GOAL #3   Title negotiates ramp & curb without device with supervision. (Target Date 08/11/14)   Status Achieved           PT Long Term Goals - 08/05/14 1610    PT LONG TERM GOAL #1   Title Berg Balance >/= 54/56 (Target Date 09/11/14)   Status New   PT LONG TERM GOAL #2   Title demonstrates / verbalizes progressive fitness program / HEP correctly. (Target Date 09/11/14)   Status New   PT LONG TERM GOAL #3   Title Dynamic Gait Index >/= 19/24. (Target Date 09/11/14)   Status New   PT LONG TERM GOAL #4   Title ambulates 300' without device independently (Target Date: 09/11/14)   Status New   PT LONG TERM GOAL #5   Title negotiate ramp, curb & stairs (1 rail) independently. (Target Date 2/131/6)   Status New   Additional Long Term Goals   Additional Long Term Goals Yes   PT LONG TERM GOAL #6   Title lifts & carry items simulating work activities safely. (Target Date 09/11/14)  Status New   PT LONG TERM GOAL #7   Title improve functional status score via FOTO by >10 points (Target Date 09/11/14)   Status New           Plan - 08/12/14 1024    Clinical Impression Statement Pt continues to make good progress toward her goals.    Pt will benefit from skilled therapeutic intervention in order to improve on the following deficits Abnormal gait;Decreased activity tolerance;Decreased balance;Decreased mobility;Decreased strength   Rehab Potential Good   PT Frequency 2x / week   PT Duration 8 weeks   PT Treatment/Interventions ADLs/Self Care Home Management;Gait training;Stair training;Functional mobility training;Therapeutic activities;Therapeutic exercise;Balance training;Neuromuscular re-education;Patient/family education   PT Next Visit Plan Continue with strengthening and balance activities   PT Home Exercise Plan Strength &  balance   Consulted and Agree with Plan of Care Patient        Problem List Patient Active Problem List   Diagnosis Date Noted  . Diabetes mellitus 01/25/2011    [name] 08/13/2014, 1:04 PM  [name], PTA, [facility] [address] [place], [place] [address] [phone] 08/13/2014, 1:06 PM

## 2014-08-17 ENCOUNTER — Ambulatory Visit: Payer: Medicare Other | Admitting: Physical Therapy

## 2014-08-17 ENCOUNTER — Encounter: Payer: Self-pay | Admitting: Physical Therapy

## 2014-08-17 DIAGNOSIS — R269 Unspecified abnormalities of gait and mobility: Secondary | ICD-10-CM

## 2014-08-17 DIAGNOSIS — R6889 Other general symptoms and signs: Secondary | ICD-10-CM

## 2014-08-17 DIAGNOSIS — R531 Weakness: Secondary | ICD-10-CM

## 2014-08-17 DIAGNOSIS — Z9181 History of falling: Secondary | ICD-10-CM | POA: Diagnosis not present

## 2014-08-17 NOTE — Therapy (Signed)
[facility] [address] [place], [place], [address] Phone: [phone]   Fax:  [phone]  Physical Therapy Treatment  Patient Details  Name: Kirsten Kelly MRN: [id number] Date of Birth: 1927-08-06 Referring Provider:  Vidal Schwalbe, MD  Encounter Date: 08/17/2014      PT End of Session - 08/17/14 1100    Visit Number 6   Number of Visits 17   Date for PT Re-Evaluation 09/11/14   PT Start Time 1100   PT Stop Time 1145   PT Time Calculation (min) 45 min   Equipment Utilized During Treatment Gait belt   Activity Tolerance Patient tolerated treatment well   Behavior During Therapy [facility] for tasks assessed/performed      Past Medical History  Diagnosis Date  . Hypertension   . Diabetes mellitus     Past Surgical History  Procedure Laterality Date  . Abdominal hysterectomy      There were no vitals taken for this visit.  Visit Diagnosis:  Abnormality of gait  Weakness generalized  Activity intolerance      Subjective Assessment - 08/17/14 1108    Symptoms She worked this weekend and almost fell when carrying ~10# box of veggies out of freezer over threshold.   Currently in Pain? No/denies                    [facility] Adult PT Treatment/Exercise - 08/17/14 1100    Ambulation/Gait   Ambulation/Gait Yes   Dynamic Standing Balance   Dynamic Standing - Balance Support No upper extremity supported  In parallel bars with occassional touch   Dynamic Standing - Level of Assistance 5: Stand by assistance   Dynamic Standing - Balance Activities Compliant surfaces;Foam balance beam;Alternating  foot traps;Eyes open;Eyes closed;Head turns;Head nods   Knee/Hip Exercises: Aerobic   Stationary Bike NuStep Level 5 wtih 4 extremities 5 minutes   Knee/Hip Exercises: Standing   Heel Raises 10 reps  Rocking heel raise to toe raise       Neuromuscular Re-Education: PT demo with instruction in proper  lifting technique. Patient able to lift and carry 15# box with supervision & cues.         PT Education - 08/17/14 1217    Education provided Yes   Education Details Added corner vestibular exercises (see pt instructions),    Person(s) Educated Patient   Methods Explanation;Demonstration;Handout   Comprehension Verbalized understanding;Returned demonstration          PT Short Term Goals - 08/10/14 1206    PT SHORT TERM GOAL #1   Title Merrilee Jansky Balance >52/56 (Target Date: 08/11/14)   Baseline met 08-10-14 score 53/56   Status Achieved   PT SHORT TERM GOAL #2   Title ambulates 300' without device with cues only on deviations (Target Date: 08/11/14)   Status Achieved   PT SHORT TERM GOAL #3   Title negotiates ramp & curb without device with supervision. (Target Date 08/11/14)   Status Achieved           PT Long Term Goals - 08/05/14 1610    PT LONG TERM GOAL #1   Title Berg Balance >/= 54/56 (Target Date 09/11/14)   Status New   PT LONG TERM GOAL #2   Title demonstrates / verbalizes progressive fitness program / HEP correctly. (Target Date 09/11/14)   Status New   PT LONG TERM GOAL #3   Title Dynamic Gait Index >/= 19/24. (Target Date 09/11/14)  Status New   PT LONG TERM GOAL #4   Title ambulates 300' without device independently (Target Date: 09/11/14)   Status New   PT LONG TERM GOAL #5   Title negotiate ramp, curb & stairs (1 rail) independently. (Target Date 2/131/6)   Status New   Additional Long Term Goals   Additional Long Term Goals Yes   PT LONG TERM GOAL #6   Title lifts & carry items simulating work activities safely. (Target Date 09/11/14)   Status New   PT LONG TERM GOAL #7   Title improve functional status score via FOTO by >10 points (Target Date 09/11/14)   Status New               Plan - 08/17/14 1100    Clinical Impression Statement Patient improved ability to lift with better mechanics and balance after instruction & practice. She was  challenged by vestibular exercises added to HEP.   Pt will benefit from skilled therapeutic intervention in order to improve on the following deficits Abnormal gait;Decreased activity tolerance;Decreased balance;Decreased mobility;Decreased strength   Rehab Potential Good   PT Frequency 2x / week   PT Duration 8 weeks   PT Treatment/Interventions ADLs/Self Care Home Management;Gait training;Stair training;Functional mobility training;Therapeutic activities;Therapeutic exercise;Balance training;Neuromuscular re-education;Patient/family education   PT Next Visit Plan Add to HEP to address other components of well-rounded fitness program. Working on lifting /carrying -work simulated tasks. Then begin assessment for discharge.   PT Home Exercise Plan Added flexibility exercises and walking program to HEP   Consulted and Agree with Plan of Care Patient        Problem List Patient Active Problem List   Diagnosis Date Noted  . Diabetes mellitus 01/25/2011    Charlott Calvario PT, DPT 08/17/2014, 12:24 PM  [facility] [address] [place], [place], [address] Phone: [phone]   Fax:  [phone]

## 2014-08-17 NOTE — Patient Instructions (Signed)
Feet Partial Heel-Toe, Head Motion - Eyes Open   Stand in corner with chair in front of you. With eyes open, right foot partially in front of the other, move head slowly: up and down, side to side and diagonals Repeat __10 __ times each way per session. Do __1__ sessions per day.  Copyright  VHI. All rights reserved.  Feet Together, Head Motion - Eyes Closed    Stand in corner with chair in front of you. With eyes closed and feet together, move head slowly, up and down, side to side and diagonals. Repeat __10__ times each direction per session. Do _1___ sessions per day.  Copyright  VHI. All rights reserved.  Feet Together (Compliant Surface) Head Motion - Eyes Open   Stand in corner with chair in front of you. With eyes open, standing on compliant surface: _____pillow or foam___, feet together, move head slowly: up and down, side to side and diagonals.  Repeat __10__ times each direction per session. Do ___1_ sessions per day.  Copyright  VHI. All rights reserved.  Feet Apart (Compliant Surface) Head Motion - Eyes Closed   Stand in corner with chair in front of you. Stand on compliant surface: ___pillow or foam_____ with feet shoulder width apart. Close eyes and move head slowly, up and down, side to side and diagonals. Repeat _10___ times each direction per session. Do __1__ sessions per day.  Copyright  VHI. All rights reserved.  Marching In-Place   Stand near counter or chair backs. Standing straight, alternate bringing knees toward trunk with hands touching your knees. Do _10-15 reps.__ Do __1_ times per day.  Copyright  VHI. All rights reserved.  Toe / Heel Raise   Stand near counter or chair back. Gently rock back on heels and raise toes. Then rock forward on toes and raise heels. Repeat sequence __10-15__ times per session. Do ___1_ sessions per day.  Copyright  VHI. All rights reserved.

## 2014-08-19 ENCOUNTER — Ambulatory Visit: Payer: Medicare Other | Admitting: Physical Therapy

## 2014-08-19 ENCOUNTER — Encounter: Payer: Self-pay | Admitting: Physical Therapy

## 2014-08-19 DIAGNOSIS — Z9181 History of falling: Secondary | ICD-10-CM | POA: Diagnosis not present

## 2014-08-19 DIAGNOSIS — R531 Weakness: Secondary | ICD-10-CM | POA: Diagnosis not present

## 2014-08-19 DIAGNOSIS — R6889 Other general symptoms and signs: Secondary | ICD-10-CM | POA: Diagnosis not present

## 2014-08-19 DIAGNOSIS — R269 Unspecified abnormalities of gait and mobility: Secondary | ICD-10-CM

## 2014-08-19 NOTE — Patient Instructions (Signed)
Calf Stretch   Stand with hands supported on wall, elbows slightly bent, front knee bent, back knee straight, feet parallel and both heels on floor. Lean into wall by pushing hips forward until a stretch is felt in calf muscle. Hold _15___ seconds. Repeat with leg positions switched. 3 times each foot forward. 1-2 times a day.  Copyright  VHI. All rights reserved.  Chair Sitting   Sit at edge of seat, spine straight, one leg extended. Put a hand on each thigh and bend forward from the hip, keeping spine straight. Allow hand on extended leg to reach toward toes. Support upper body with other arm. Hold 20___ seconds. Repeat _3_ times each leg. Do __1-2_ sessions per day.  Copyright  VHI. All rights reserved.  Isolated Spine Extender   Inhale slowly. Exhale while sitting or standing as tall as possible. Hold 10-15 sec's. Repeat _3-5___ times. Do _1-2___ sessions per day.  http://gt2.exer.us/858   Copyright  VHI. All rights reserved.

## 2014-08-19 NOTE — Therapy (Signed)
[facility] [address] [place], [place], [address] Phone: [phone]   Fax:  [phone]  Physical Therapy Treatment  Patient Details  Name: Kirsten Kelly MRN: [id number] Date of Birth: [birth year] Referring Provider:  Vidal Schwalbe, MD  Encounter Date: 08/19/2014      PT End of Session - 08/19/14 1107    Visit Number 7   Number of Visits 17   Date for PT Re-Evaluation 09/11/14   PT Start Time 1103   PT Stop Time 1146   PT Time Calculation (min) 43 min   Equipment Utilized During Treatment Gait belt   Activity Tolerance Patient tolerated treatment well   Behavior During Therapy [facility] for tasks assessed/performed      Past Medical History  Diagnosis Date  . Hypertension   . Diabetes mellitus     Past Surgical History  Procedure Laterality Date  . Abdominal hysterectomy      There were no vitals taken for this visit.  Visit Diagnosis:  Abnormality of gait  Weakness generalized  Activity intolerance      Subjective Assessment - 08/19/14 1106    Symptoms No new complaints. No falls to report. No new pain, has chronic back pain that has not changed. Not hurting currently.   Currently in Pain? No/denies     Treatment:   Neuro Re-ed: - corner on two pillows: narrow base of support with eyes closed- steady hold 15 sec's x 3 reps, head movements up/down, left/right and diagonals both ways x 10 each way with min assist.  - obstacle course consisting of boster, red mat, two bolsters, blue mat- 4 lap with stepping over bolsters and walking across mats while carrying 10# crate with min guard assist. Cues on stance for improved body mechanics with picking up crate from floor.   - obstacle course for balance: cones, red mat with hurdles on it, stepping stones, blue mat with hurdle on it- 4 laps with min assist at times for balance and cues on weight shifting with stepping over hurdles for improved single  leg stance balance.  Exercise: - Scifit x 4 extremities level 4.0 x 10 minutes with goal RPM >/=55 for strengthening and activity tolerance. Pt shown how to set up and adjust to fit her for carry over to community fitness gym.  - Seated hamstring stretch 20 sec x 3 each leg, standing calf stretch at wall 15 sec's x 3 each leg and seated upper trunk posture/trunk extension stretch 10 sec's x 3.          PT Education - 08/19/14 1345    Education provided Yes   Education Details added stretches to [employer]) Educated Patient   Methods Explanation;Demonstration;Handout   Comprehension Verbalized understanding;Returned demonstration          PT Short Term Goals - 08/10/14 1206    PT SHORT TERM GOAL #1   Title Merrilee Jansky Balance >52/56 (Target Date: 08/11/14)   Baseline met 08-10-14 score 53/56   Status Achieved   PT SHORT TERM GOAL #2   Title ambulates 300' without device with cues only on deviations (Target Date: 08/11/14)   Status Achieved   PT SHORT TERM GOAL #3   Title negotiates ramp & curb without device with supervision. (Target Date 08/11/14)   Status Achieved           PT Long Term Goals - 08/05/14 1610    PT LONG TERM GOAL #1   Title Merrilee Jansky  Balance >/= 54/56 (Target Date 09/11/14)   Status New   PT LONG TERM GOAL #2   Title demonstrates / verbalizes progressive fitness program / HEP correctly. (Target Date 09/11/14)   Status New   PT LONG TERM GOAL #3   Title Dynamic Gait Index >/= 19/24. (Target Date 09/11/14)   Status New   PT LONG TERM GOAL #4   Title ambulates 300' without device independently (Target Date: 09/11/14)   Status New   PT LONG TERM GOAL #5   Title negotiate ramp, curb & stairs (1 rail) independently. (Target Date 2/131/6)   Status New   Additional Long Term Goals   Additional Long Term Goals Yes   PT LONG TERM GOAL #6   Title lifts & carry items simulating work activities safely. (Target Date 09/11/14)   Status New   PT LONG TERM GOAL #7    Title improve functional status score via FOTO by >10 points (Target Date 09/11/14)   Status New           Plan - 08/19/14 1107    Clinical Impression Statement Pt demo'd good technique with lifting and carrying objects today with minimal reminder cues on body mechanics with lifting from floor. Issued stretches for HEP. Progressing well and will begin to assess LTG's next week for possible discharge.   Pt will benefit from skilled therapeutic intervention in order to improve on the following deficits Abnormal gait;Decreased activity tolerance;Decreased balance;Decreased mobility;Decreased strength   Rehab Potential Good   PT Frequency 2x / week   PT Duration 8 weeks   PT Treatment/Interventions ADLs/Self Care Home Management;Gait training;Stair training;Functional mobility training;Therapeutic activities;Therapeutic exercise;Balance training;Neuromuscular re-education;Patient/family education   PT Next Visit Plan begin assessment for discharge. discuss community fitness options.   PT Home Exercise Plan Added flexibility exercises and walking program to HEP   Consulted and Agree with Plan of Care Patient        Problem List Patient Active Problem List   Diagnosis Date Noted  . Diabetes mellitus 01/25/2011    [name] 08/19/2014, 1:47 PM  [name], PTA, [facility] [address] [place], [place] [address] [phone] 08/19/2014, 1:47 PM

## 2014-08-24 ENCOUNTER — Ambulatory Visit: Payer: Medicare Other | Admitting: Physical Therapy

## 2014-08-26 ENCOUNTER — Ambulatory Visit: Payer: Medicare Other | Admitting: Physical Therapy

## 2014-08-31 ENCOUNTER — Ambulatory Visit: Payer: Medicare Other | Attending: Family Medicine | Admitting: Physical Therapy

## 2014-08-31 ENCOUNTER — Encounter: Payer: Self-pay | Admitting: Physical Therapy

## 2014-08-31 DIAGNOSIS — Z9181 History of falling: Secondary | ICD-10-CM | POA: Insufficient documentation

## 2014-08-31 DIAGNOSIS — R531 Weakness: Secondary | ICD-10-CM | POA: Diagnosis not present

## 2014-08-31 DIAGNOSIS — R269 Unspecified abnormalities of gait and mobility: Secondary | ICD-10-CM | POA: Diagnosis not present

## 2014-08-31 DIAGNOSIS — R6889 Other general symptoms and signs: Secondary | ICD-10-CM | POA: Diagnosis not present

## 2014-09-02 ENCOUNTER — Encounter: Payer: Self-pay | Admitting: Physical Therapy

## 2014-09-02 ENCOUNTER — Ambulatory Visit: Payer: Medicare Other | Admitting: Physical Therapy

## 2014-09-02 DIAGNOSIS — R6889 Other general symptoms and signs: Secondary | ICD-10-CM | POA: Diagnosis not present

## 2014-09-02 DIAGNOSIS — R269 Unspecified abnormalities of gait and mobility: Secondary | ICD-10-CM

## 2014-09-02 DIAGNOSIS — Z9181 History of falling: Secondary | ICD-10-CM | POA: Diagnosis not present

## 2014-09-02 DIAGNOSIS — R531 Weakness: Secondary | ICD-10-CM

## 2014-09-02 NOTE — Therapy (Signed)
[facility] [address] [place], [place], [address] Phone: [phone]   Fax:  [phone]  Physical Therapy Treatment  Patient Details  Name: Kirsten Kelly MRN: [id number] Date of Birth: [birth year] Referring Provider:  Vidal Schwalbe, MD  Encounter Date: 08/31/2014    Past Medical History  Diagnosis Date  . Hypertension   . Diabetes mellitus     Past Surgical History  Procedure Laterality Date  . Abdominal hysterectomy      There were no vitals taken for this visit.  Visit Diagnosis:  Abnormality of gait  Weakness generalized  Activity intolerance     08/31/14 1106  Ambulation/Gait  Ambulation/Gait Yes  Ambulation/Gait Assistance 7: Independent  Ambulation Distance (Feet) 430 Feet  Assistive device None  Gait Pattern WFL  Ambulation Surface Level;Indoor  Gait velocity 8.46 sec's= 3.89 ft/sec without AD  Ramp 7: Independent  Curb 7: Independent  Berg Balance Test  Sit to Stand 4  Standing Unsupported 4  Sitting with Back Unsupported but Feet Supported on Floor or Stool 4  Stand to Sit 4  Transfers 4  Standing Unsupported with Eyes Closed 4  Standing Ubsupported with Feet Together 4  From Standing, Reach Forward with Outstretched Arm 3 (8 inches)  From Standing Position, Pick up Object from Floor 4  From Standing Position, Turn to Look Behind Over each Shoulder 4  Turn 360 Degrees 4  Standing Unsupported, Alternately Place Feet on Step/Stool 4  Standing Unsupported, One Foot in Front 4  Standing on One Leg 3 (left stance only)  Total Score 54  Dynamic Gait Index  Level Surface 3  Change in Gait Speed 3  Gait with Horizontal Head Turns 3  Gait with Vertical Head Turns 3  Gait and Pivot Turn 3  Step Over Obstacle 2  Step Around Obstacles 3  Steps 2  Total Score 22   Self Care: Discussed community fitness options with patient. She has friends who go regularly to the [facility] and has  [employer] secondary. Pt given information on the silver sneakers program to check into for continued fitness in the community.         PT Short Term Goals - 08/10/14 1206    PT SHORT TERM GOAL #1   Title Merrilee Jansky Balance >52/56 (Target Date: 08/11/14)   Baseline met 08-10-14 score 53/56   Status Achieved   PT SHORT TERM GOAL #2   Title ambulates 300' without device with cues only on deviations (Target Date: 08/11/14)   Status Achieved   PT SHORT TERM GOAL #3   Title negotiates ramp & curb without device with supervision. (Target Date 08/11/14)   Status Achieved           PT Long Term Goals - 09/02/14 0831    PT LONG TERM GOAL #1   Title Berg Balance >/= 54/56 (Target Date 09/11/14)   Baseline met 54/56   Status Achieved   PT LONG TERM GOAL #2   Title demonstrates / verbalizes progressive fitness program / HEP correctly. (Target Date 09/11/14)   Status Achieved   PT LONG TERM GOAL #3   Title Dynamic Gait Index >/= 19/24. (Target Date 09/11/14)   Baseline met 22/24   Status Achieved   PT LONG TERM GOAL #4   Title ambulates 300' without device independently (Target Date: 09/11/14)   Status Achieved   PT LONG TERM GOAL #5   Title negotiate ramp, curb & stairs (1 rail) independently. (Target Date  2/131/6)   Status Achieved   PT LONG TERM GOAL #6   Title lifts & carry items simulating work activities safely. (Target Date 09/11/14)   Status On-going   PT LONG TERM GOAL #7   Title improve functional status score via FOTO by >10 points (Target Date 09/11/14)   Baseline intial score 56, discharge score 92   Status Achieved       Problem List Patient Active Problem List   Diagnosis Date Noted  . Diabetes mellitus 01/25/2011    [name] 09/02/2014, 8:32 AM [name], PTA, [facility] [address], [place], [place] [address] [phone] 09/02/2014, 8:32 AM

## 2014-09-02 NOTE — Therapy (Signed)
[facility] [address] [place], [place], [address] Phone: [phone]   Fax:  [phone]  Physical Therapy Treatment  Patient Details  Name: Kirsten Kelly MRN: [id number] Date of Birth: 17-Apr-1928 Referring Provider:  Vidal Schwalbe, MD  Encounter Date: 09/02/2014      PT End of Session - 09/02/14 1102    Visit Number 9   Number of Visits 17   Date for PT Re-Evaluation 09/11/14   PT Start Time 1101   PT Stop Time 1112   PT Time Calculation (min) 11 min   Equipment Utilized During Treatment Gait belt   Activity Tolerance Patient tolerated treatment well   Behavior During Therapy [facility] for tasks assessed/performed      Past Medical History  Diagnosis Date  . Hypertension   . Diabetes mellitus     Past Surgical History  Procedure Laterality Date  . Abdominal hysterectomy      There were no vitals taken for this visit.  Visit Diagnosis:  Abnormality of gait  Weakness generalized  Activity intolerance      Subjective Assessment - 09/02/14 1102    Symptoms No new complaints. No falls or pain to report.   Currently in Pain? No/denies     Treatment: Ther. Activity - lifting 10 pound crate from floor, carried 115 feet and set back down to ground with good body mechanics through out. - waist level stand pivot transfer of 10 pound crated with good body mechanics. - lifting 10 pound crated from floor, carring 50 feet while stepping over 3 obstacles of varied height without issues with supervision.  Self Care: (less than 1 minute spent on this topic) - pt has not checked into [employer] as yet, plans to do so this week. - still challenged by current HEP and performing 4-5 x a week.        PT Short Term Goals - 08/10/14 1206    PT SHORT TERM GOAL #1   Title Merrilee Jansky Balance >52/56 (Target Date: 08/11/14)   Baseline met 08-10-14 score 53/56   Status Achieved   PT SHORT TERM GOAL #2   Title ambulates 300'  without device with cues only on deviations (Target Date: 08/11/14)   Status Achieved   PT SHORT TERM GOAL #3   Title negotiates ramp & curb without device with supervision. (Target Date 08/11/14)   Status Achieved           PT Long Term Goals - 09/02/14 1148    PT LONG TERM GOAL #1   Title Berg Balance >/= 54/56 (Target Date 09/11/14)   Baseline met 54/56   Status Achieved   PT LONG TERM GOAL #2   Title demonstrates / verbalizes progressive fitness program / HEP correctly. (Target Date 09/11/14)   Status Achieved   PT LONG TERM GOAL #3   Title Dynamic Gait Index >/= 19/24. (Target Date 09/11/14)   Baseline met 22/24   Status Achieved   PT LONG TERM GOAL #4   Title ambulates 300' without device independently (Target Date: 09/11/14)   Status Achieved   PT LONG TERM GOAL #5   Title negotiate ramp, curb & stairs (1 rail) independently. (Target Date 2/131/6)   Status Achieved   PT LONG TERM GOAL #6   Title lifts & carry items simulating work activities safely. (Target Date 09/11/14)   Status Achieved   PT LONG TERM GOAL #7   Title improve functional status score via FOTO by >10 points (  Target Date 09/11/14)   Baseline intial score 56, discharge score 92   Status Achieved           Plan - 09/02/14 1103    Clinical Impression Statement Last LTG met today.    Pt will benefit from skilled therapeutic intervention in order to improve on the following deficits Abnormal gait;Decreased activity tolerance;Decreased balance;Decreased mobility;Decreased strength   Rehab Potential Good   PT Frequency 2x / week   PT Duration 8 weeks   PT Treatment/Interventions ADLs/Self Care Home Management;Gait training;Stair training;Functional mobility training;Therapeutic activities;Therapeutic exercise;Balance training;Neuromuscular re-education;Patient/family education   PT Next Visit Plan discharge today with goals met   PT Home Exercise Plan Added flexibility exercises and walking program to HEP    Consulted and Agree with Plan of Care Patient        Problem List Patient Active Problem List   Diagnosis Date Noted  . Diabetes mellitus 01/25/2011    Kirsten Kelly 09/02/2014, 11:49 AM  Kirsten Kelly, PTA, [facility] [address] [place], [place] [address] [phone] 09/02/2014, 11:49 AM

## 2014-09-07 ENCOUNTER — Encounter: Payer: Medicare Other | Admitting: Physical Therapy

## 2014-09-09 ENCOUNTER — Encounter: Payer: Medicare Other | Admitting: Physical Therapy

## 2014-09-13 ENCOUNTER — Other Ambulatory Visit: Payer: Medicare Other

## 2014-09-14 ENCOUNTER — Encounter: Payer: Self-pay | Admitting: Physical Therapy

## 2014-09-14 ENCOUNTER — Encounter: Payer: Medicare Other | Admitting: Physical Therapy

## 2014-09-14 NOTE — Therapy (Signed)
[facility] [address] [place], [place], [address] Phone: [phone]   Fax:  [phone]  Patient Details  Name: Kirsten Kelly MRN: [id number] Date of Birth: 12/09/1927 Referring Provider: Cynthia White, MD Encounter Date: 09/14/2014 PHYSICAL THERAPY DISCHARGE SUMMARY  Visits from Start of Care: 9  Current functional level related to goals / functional outcomes:     PT Long Term Goals - 09/02/14 1148    PT LONG TERM GOAL #1   Title Berg Balance >/= 54/56 (Target Date 09/11/14)   Baseline met 54/56   Status Achieved   PT LONG TERM GOAL #2   Title demonstrates / verbalizes progressive fitness program / HEP correctly. (Target Date 09/11/14)   Status Achieved   PT LONG TERM GOAL #3   Title Dynamic Gait Index >/= 19/24. (Target Date 09/11/14)   Baseline met 22/24   Status Achieved   PT LONG TERM GOAL #4   Title ambulates 300' without device independently (Target Date: 09/11/14)   Status Achieved   PT LONG TERM GOAL #5   Title negotiate ramp, curb & stairs (1 rail) independently. (Target Date 2/131/6)   Status Achieved   PT LONG TERM GOAL #6   Title lifts & carry items simulating work activities safely. (Target Date 09/11/14)   Status Achieved   PT LONG TERM GOAL #7   Title improve functional status score via FOTO by >10 points (Target Date 09/11/14)   Baseline intial score 56, discharge score 92   Status Achieved       Remaining deficits: none   Education / Equipment: Fitness plan Plan: Patient agrees to discharge.  Patient goals were met. Patient is being discharged due to meeting the stated rehab goals.  ?????       , PT, DPT 09/14/2014, 10:54 AM  [facility] [address] [place], [place], [address] Phone: [phone]   Fax:  [phone] 

## 2014-09-16 ENCOUNTER — Encounter: Payer: Medicare Other | Admitting: Physical Therapy

## 2014-09-21 ENCOUNTER — Encounter: Payer: Medicare Other | Admitting: Physical Therapy

## 2014-09-23 ENCOUNTER — Encounter: Payer: Medicare Other | Admitting: Physical Therapy

## 2014-09-24 ENCOUNTER — Ambulatory Visit (INDEPENDENT_AMBULATORY_CARE_PROVIDER_SITE_OTHER): Payer: Medicare Other | Admitting: Podiatrist

## 2014-09-24 ENCOUNTER — Encounter: Payer: Self-pay | Admitting: Podiatrist

## 2014-09-24 DIAGNOSIS — M79609 Pain in unspecified limb: Principal | ICD-10-CM

## 2014-09-24 DIAGNOSIS — B351 Tinea unguium: Secondary | ICD-10-CM | POA: Diagnosis not present

## 2014-09-24 DIAGNOSIS — R52 Pain, unspecified: Secondary | ICD-10-CM | POA: Diagnosis not present

## 2014-09-24 NOTE — Progress Notes (Signed)
HPI:  Patient presents today for follow up of foot and nail care. Denies any new complaints today.  Objective:  Patients chart is reviewed.  Vascular status reveals pedal pulses noted at 1 out of 4 dp and pt bilateral .  Neurological sensation is intact to Semmes Weinstein monofilament bilateral.  Patients nails are thickened, discolored, distrophic, friable and brittle with yellow-brown discoloration. Patient subjectively relates they are painful with shoes and with ambulation of bilateral feet.  Assessment:  Symptomatic onychomycosis  Plan:  Discussed treatment options and alternatives.  The symptomatic toenails were debrided through manual an mechanical means without complication.  Return appointment recommended at routine intervals of 3 months      

## 2014-09-28 ENCOUNTER — Encounter: Payer: Medicare Other | Admitting: Physical Therapy

## 2014-09-30 ENCOUNTER — Encounter: Payer: Medicare Other | Admitting: Physical Therapy

## 2014-10-22 ENCOUNTER — Encounter: Payer: Medicare Other | Admitting: Physical Therapy

## 2014-11-24 DIAGNOSIS — E784 Other hyperlipidemia: Secondary | ICD-10-CM | POA: Diagnosis not present

## 2014-11-24 DIAGNOSIS — F325 Major depressive disorder, single episode, in full remission: Secondary | ICD-10-CM | POA: Diagnosis not present

## 2014-11-24 DIAGNOSIS — Z0000 Encounter for general adult medical examination without abnormal findings: Secondary | ICD-10-CM | POA: Diagnosis not present

## 2014-11-24 DIAGNOSIS — N181 Chronic kidney disease, stage 1: Secondary | ICD-10-CM | POA: Diagnosis not present

## 2014-11-24 DIAGNOSIS — I129 Hypertensive chronic kidney disease with stage 1 through stage 4 chronic kidney disease, or unspecified chronic kidney disease: Secondary | ICD-10-CM | POA: Diagnosis not present

## 2014-11-24 DIAGNOSIS — E1121 Type 2 diabetes mellitus with diabetic nephropathy: Secondary | ICD-10-CM | POA: Diagnosis not present

## 2015-01-06 DIAGNOSIS — H524 Presbyopia: Secondary | ICD-10-CM | POA: Diagnosis not present

## 2015-01-06 DIAGNOSIS — H2513 Age-related nuclear cataract, bilateral: Secondary | ICD-10-CM | POA: Diagnosis not present

## 2015-01-06 DIAGNOSIS — E119 Type 2 diabetes mellitus without complications: Secondary | ICD-10-CM | POA: Diagnosis not present

## 2015-01-10 ENCOUNTER — Ambulatory Visit: Payer: Medicare Other

## 2015-01-13 ENCOUNTER — Ambulatory Visit: Payer: Medicare Other

## 2015-01-20 ENCOUNTER — Ambulatory Visit (INDEPENDENT_AMBULATORY_CARE_PROVIDER_SITE_OTHER): Payer: Medicare Other | Admitting: Podiatry

## 2015-01-20 VITALS — BP 147/70 | HR 62 | Resp 12

## 2015-01-20 DIAGNOSIS — M79606 Pain in leg, unspecified: Secondary | ICD-10-CM | POA: Diagnosis not present

## 2015-01-20 DIAGNOSIS — B351 Tinea unguium: Secondary | ICD-10-CM

## 2015-01-20 NOTE — Progress Notes (Signed)
   Subjective:    Patient ID: Kirsten Kelly, female    DOB: 15-Sep-1927, 81 y.o.   MRN: [id number]  HPI Patient needs bilateral debridement.   Review of Systems     Objective:   Physical Exam        Assessment & Plan:

## 2015-01-21 NOTE — Progress Notes (Signed)
Subjective:     Patient ID: Kirsten Kelly, female   DOB: 05/31/1928, 81 y.o.   MRN: [id number]  HPI patient presents with thick yellow brittle nailbeds 1-5 both feet that are painful when pressed   Review of Systems     Objective:   Physical Exam Neurovascular status unchanged with thick yellow brittle nailbeds 1-5 both feet that are painful when palpated 1-5    Assessment:     Chronic mycotic nail infection 1-5 both feet    Plan:     Debride painful nailbeds 1-5 both feet with no iatrogenic bleeding noted

## 2015-02-22 ENCOUNTER — Other Ambulatory Visit: Payer: Self-pay

## 2015-02-22 DIAGNOSIS — Z1231 Encounter for screening mammogram for malignant neoplasm of breast: Secondary | ICD-10-CM

## 2015-03-31 ENCOUNTER — Ambulatory Visit
Admission: RE | Admit: 2015-03-31 | Discharge: 2015-03-31 | Disposition: A | Discharge status: Home / Self Care | Payer: Medicare Other | Source: Ambulatory Visit

## 2015-03-31 DIAGNOSIS — Z1231 Encounter for screening mammogram for malignant neoplasm of breast: Secondary | ICD-10-CM

## 2015-04-25 ENCOUNTER — Encounter: Payer: Self-pay | Admitting: Podiatry

## 2015-04-25 ENCOUNTER — Ambulatory Visit (INDEPENDENT_AMBULATORY_CARE_PROVIDER_SITE_OTHER): Payer: Medicare Other | Admitting: Podiatry

## 2015-04-25 VITALS — BP 149/73 | HR 61 | Resp 16

## 2015-04-25 DIAGNOSIS — L84 Corns and callosities: Secondary | ICD-10-CM

## 2015-04-25 DIAGNOSIS — B351 Tinea unguium: Secondary | ICD-10-CM

## 2015-04-25 DIAGNOSIS — M79606 Pain in leg, unspecified: Secondary | ICD-10-CM

## 2015-04-25 DIAGNOSIS — E119 Type 2 diabetes mellitus without complications: Secondary | ICD-10-CM

## 2015-04-25 NOTE — Progress Notes (Signed)
Subjective:     Patient ID: Kirsten Kelly, female   DOB: August 30, 1927, 81 y.o.   MRN: [id number]  HPI patient presents with lesions under the fifth metatarsals of both feet and nail disease 1-5 both feet that are painful. Long-term diabetes is noted   Review of Systems     Objective:   Physical Exam Neurovascular status is diminished but unchanged from previous visit with diminished sharp Dole vibratory and diminished DTR reflexes. Patient's noted to have thick yellow brittle nailbeds 1-5 both feet that are painful and lesions underneath the fifth metatarsals of both feet that are painful when pressed with lucent-type core worse    Assessment:     At risk diabetes with mycotic nail infection 1-5 both feet and lesions underneath the fifth metatarsals    Plan:     Debrided nailbeds 1-5 both feet and lesions under the fifth metatarsal both feet with no iatrogenic bleeding noted

## 2015-04-28 ENCOUNTER — Ambulatory Visit: Payer: Medicare Other | Admitting: Podiatry

## 2015-05-26 DIAGNOSIS — E1121 Type 2 diabetes mellitus with diabetic nephropathy: Secondary | ICD-10-CM | POA: Diagnosis not present

## 2015-05-26 DIAGNOSIS — E784 Other hyperlipidemia: Secondary | ICD-10-CM | POA: Diagnosis not present

## 2015-05-26 DIAGNOSIS — N181 Chronic kidney disease, stage 1: Secondary | ICD-10-CM | POA: Diagnosis not present

## 2015-05-26 DIAGNOSIS — F325 Major depressive disorder, single episode, in full remission: Secondary | ICD-10-CM | POA: Diagnosis not present

## 2015-08-02 ENCOUNTER — Ambulatory Visit (INDEPENDENT_AMBULATORY_CARE_PROVIDER_SITE_OTHER): Payer: Medicare Other | Admitting: Podiatry

## 2015-08-02 ENCOUNTER — Encounter: Payer: Self-pay | Admitting: Podiatry

## 2015-08-02 DIAGNOSIS — M79673 Pain in unspecified foot: Secondary | ICD-10-CM

## 2015-08-02 DIAGNOSIS — B351 Tinea unguium: Secondary | ICD-10-CM

## 2015-08-02 DIAGNOSIS — E119 Type 2 diabetes mellitus without complications: Secondary | ICD-10-CM | POA: Diagnosis not present

## 2015-08-02 DIAGNOSIS — M79609 Pain in unspecified limb: Secondary | ICD-10-CM

## 2015-08-02 NOTE — Progress Notes (Signed)
Patient ID: Kirsten Kelly, female   DOB: 12/09/1927, 82 y.o.   MRN: [id number] Complaint:  Visit Type: Patient returns to my office for continued preventative foot care services. Complaint: Patient states" my nails have grown long and thick and become painful to walk and wear shoes" Patient has been diagnosed with DM with no foot complications. The patient presents for preventative foot care services. No changes to ROS  Podiatric Exam: Vascular: dorsalis pedis and posterior tibial pulses are palpable bilateral. Capillary return is immediate. Temperature gradient is WNL. Skin turgor WNL  Sensorium: Normal Semmes Weinstein monofilament test. Normal tactile sensation bilaterally. Nail Exam: Pt has thick disfigured discolored nails with subungual debris noted bilateral entire nail hallux through fifth toenails Ulcer Exam: There is no evidence of ulcer or pre-ulcerative changes or infection. Orthopedic Exam: Muscle tone and strength are WNL. No limitations in general ROM. No crepitus or effusions noted. Foot type and digits show no abnormalities. Bony prominences are unremarkable. Skin: No Porokeratosis. No infection or ulcers  Diagnosis:  Onychomycosis, , Pain in right toe, pain in left toes  Treatment & Plan Procedures and Treatment: Consent by patient was obtained for treatment procedures. The patient understood the discussion of treatment and procedures well. All questions were answered thoroughly reviewed. Debridement of mycotic and hypertrophic toenails, 1 through 5 bilateral and clearing of subungual debris. No ulceration, no infection noted.  Return Visit-Office Procedure: Patient instructed to return to the office for a follow up visit 3 months for continued evaluation and treatment.    Helane GuntherGregory Geno Sydnor DPM

## 2015-08-12 DIAGNOSIS — H25813 Combined forms of age-related cataract, bilateral: Secondary | ICD-10-CM | POA: Diagnosis not present

## 2015-08-12 DIAGNOSIS — H524 Presbyopia: Secondary | ICD-10-CM | POA: Diagnosis not present

## 2015-09-05 DIAGNOSIS — H2512 Age-related nuclear cataract, left eye: Secondary | ICD-10-CM | POA: Diagnosis not present

## 2015-09-12 DIAGNOSIS — H25812 Combined forms of age-related cataract, left eye: Secondary | ICD-10-CM | POA: Diagnosis not present

## 2015-09-12 DIAGNOSIS — H2512 Age-related nuclear cataract, left eye: Secondary | ICD-10-CM | POA: Diagnosis not present

## 2015-09-20 DIAGNOSIS — H2511 Age-related nuclear cataract, right eye: Secondary | ICD-10-CM | POA: Diagnosis not present

## 2015-10-03 DIAGNOSIS — H25811 Combined forms of age-related cataract, right eye: Secondary | ICD-10-CM | POA: Diagnosis not present

## 2015-10-03 DIAGNOSIS — H2511 Age-related nuclear cataract, right eye: Secondary | ICD-10-CM | POA: Diagnosis not present

## 2015-11-01 ENCOUNTER — Encounter: Payer: Self-pay | Admitting: Podiatry

## 2015-11-01 ENCOUNTER — Ambulatory Visit (INDEPENDENT_AMBULATORY_CARE_PROVIDER_SITE_OTHER): Payer: Medicare Other | Admitting: Podiatry

## 2015-11-01 DIAGNOSIS — B351 Tinea unguium: Secondary | ICD-10-CM | POA: Diagnosis not present

## 2015-11-01 DIAGNOSIS — E119 Type 2 diabetes mellitus without complications: Secondary | ICD-10-CM | POA: Diagnosis not present

## 2015-11-01 DIAGNOSIS — M79609 Pain in unspecified limb: Principal | ICD-10-CM

## 2015-11-01 DIAGNOSIS — M79673 Pain in unspecified foot: Secondary | ICD-10-CM | POA: Diagnosis not present

## 2015-11-01 NOTE — Progress Notes (Signed)
Patient ID: Kirsten Kelly, female   DOB: 12/09/1927, 82 y.o.   MRN: [id number] Complaint:  Visit Type: Patient returns to my office for continued preventative foot care services. Complaint: Patient states" my nails have grown long and thick and become painful to walk and wear shoes" Patient has been diagnosed with DM with no foot complications. The patient presents for preventative foot care services. No changes to ROS  Podiatric Exam: Vascular: dorsalis pedis and posterior tibial pulses are palpable bilateral. Capillary return is immediate. Temperature gradient is WNL. Skin turgor WNL  Sensorium: Normal Semmes Weinstein monofilament test. Normal tactile sensation bilaterally. Nail Exam: Pt has thick disfigured discolored nails with subungual debris noted bilateral entire nail hallux through fifth toenails Ulcer Exam: There is no evidence of ulcer or pre-ulcerative changes or infection. Orthopedic Exam: Muscle tone and strength are WNL. No limitations in general ROM. No crepitus or effusions noted. Foot type and digits show no abnormalities. Bony prominences are unremarkable. Skin: No Porokeratosis. No infection or ulcers  Diagnosis:  Onychomycosis, , Pain in right toe, pain in left toes  Treatment & Plan Procedures and Treatment: Consent by patient was obtained for treatment procedures. The patient understood the discussion of treatment and procedures well. All questions were answered thoroughly reviewed. Debridement of mycotic and hypertrophic toenails, 1 through 5 bilateral and clearing of subungual debris. No ulceration, no infection noted.  Return Visit-Office Procedure: Patient instructed to return to the office for a follow up visit 3 months for continued evaluation and treatment.    Kirsten Kelly DPM

## 2015-11-25 DIAGNOSIS — F321 Major depressive disorder, single episode, moderate: Secondary | ICD-10-CM | POA: Diagnosis not present

## 2015-11-25 DIAGNOSIS — Z7984 Long term (current) use of oral hypoglycemic drugs: Secondary | ICD-10-CM | POA: Diagnosis not present

## 2015-11-25 DIAGNOSIS — E784 Other hyperlipidemia: Secondary | ICD-10-CM | POA: Diagnosis not present

## 2015-11-25 DIAGNOSIS — L209 Atopic dermatitis, unspecified: Secondary | ICD-10-CM | POA: Diagnosis not present

## 2015-11-25 DIAGNOSIS — E119 Type 2 diabetes mellitus without complications: Secondary | ICD-10-CM | POA: Diagnosis not present

## 2015-11-25 DIAGNOSIS — Z0000 Encounter for general adult medical examination without abnormal findings: Secondary | ICD-10-CM | POA: Diagnosis not present

## 2015-11-25 DIAGNOSIS — I10 Essential (primary) hypertension: Secondary | ICD-10-CM | POA: Diagnosis not present

## 2015-12-29 DIAGNOSIS — F325 Major depressive disorder, single episode, in full remission: Secondary | ICD-10-CM | POA: Diagnosis not present

## 2015-12-29 DIAGNOSIS — R600 Localized edema: Secondary | ICD-10-CM | POA: Diagnosis not present

## 2015-12-29 DIAGNOSIS — R609 Edema, unspecified: Secondary | ICD-10-CM | POA: Diagnosis not present

## 2015-12-29 DIAGNOSIS — I10 Essential (primary) hypertension: Secondary | ICD-10-CM | POA: Diagnosis not present

## 2016-02-02 DIAGNOSIS — R609 Edema, unspecified: Secondary | ICD-10-CM | POA: Diagnosis not present

## 2016-02-07 ENCOUNTER — Encounter: Payer: Self-pay | Admitting: Podiatry

## 2016-02-07 ENCOUNTER — Ambulatory Visit (INDEPENDENT_AMBULATORY_CARE_PROVIDER_SITE_OTHER): Payer: Medicare Other | Admitting: Podiatry

## 2016-02-07 DIAGNOSIS — B351 Tinea unguium: Secondary | ICD-10-CM

## 2016-02-07 DIAGNOSIS — M79609 Pain in unspecified limb: Principal | ICD-10-CM

## 2016-02-07 DIAGNOSIS — E119 Type 2 diabetes mellitus without complications: Secondary | ICD-10-CM

## 2016-02-07 DIAGNOSIS — M79676 Pain in unspecified toe(s): Secondary | ICD-10-CM | POA: Diagnosis not present

## 2016-02-07 NOTE — Progress Notes (Signed)
Patient ID: Kirsten Kelly, female   DOB: 12/09/1927, 82 y.o.   MRN: [id number] Complaint:  Visit Type: Patient returns to my office for continued preventative foot care services. Complaint: Patient states" my nails have grown long and thick and become painful to walk and wear shoes" Patient has been diagnosed with DM with no foot complications. The patient presents for preventative foot care services. No changes to ROS  Podiatric Exam: Vascular: dorsalis pedis and posterior tibial pulses are palpable bilateral. Capillary return is immediate. Temperature gradient is WNL. Skin turgor WNL  Sensorium: Normal Semmes Weinstein monofilament test. Normal tactile sensation bilaterally. Nail Exam: Pt has thick disfigured discolored nails with subungual debris noted bilateral entire nail hallux through fifth toenails Ulcer Exam: There is no evidence of ulcer or pre-ulcerative changes or infection. Orthopedic Exam: Muscle tone and strength are WNL. No limitations in general ROM. No crepitus or effusions noted. Foot type and digits show no abnormalities. HAV  B/L Skin: No Porokeratosis. No infection or ulcers  Diagnosis:  Onychomycosis, , Pain in right toe, pain in left toes  Treatment & Plan Procedures and Treatment: Consent by patient was obtained for treatment procedures. The patient understood the discussion of treatment and procedures well. All questions were answered thoroughly reviewed. Debridement of mycotic and hypertrophic toenails, 1 through 5 bilateral and clearing of subungual debris. No ulceration, no infection noted.  Return Visit-Office Procedure: Patient instructed to return to the office for a follow up visit 3 months for continued evaluation and treatment.    Helane GuntherGregory Kristoph Sattler DPM

## 2016-03-08 ENCOUNTER — Other Ambulatory Visit: Payer: Self-pay | Admitting: Family Medicine

## 2016-03-08 DIAGNOSIS — Z1231 Encounter for screening mammogram for malignant neoplasm of breast: Secondary | ICD-10-CM

## 2016-03-22 DIAGNOSIS — H52223 Regular astigmatism, bilateral: Secondary | ICD-10-CM | POA: Diagnosis not present

## 2016-03-22 DIAGNOSIS — E119 Type 2 diabetes mellitus without complications: Secondary | ICD-10-CM | POA: Diagnosis not present

## 2016-03-22 DIAGNOSIS — H5213 Myopia, bilateral: Secondary | ICD-10-CM | POA: Diagnosis not present

## 2016-03-22 DIAGNOSIS — H26493 Other secondary cataract, bilateral: Secondary | ICD-10-CM | POA: Diagnosis not present

## 2016-04-12 ENCOUNTER — Ambulatory Visit: Payer: Medicare Other

## 2016-04-27 ENCOUNTER — Ambulatory Visit
Admission: RE | Admit: 2016-04-27 | Discharge: 2016-04-27 | Disposition: A | Discharge status: Home / Self Care | Payer: Medicare Other | Source: Ambulatory Visit | Attending: Family Medicine | Admitting: Family Medicine

## 2016-04-27 DIAGNOSIS — Z1231 Encounter for screening mammogram for malignant neoplasm of breast: Secondary | ICD-10-CM

## 2016-05-15 ENCOUNTER — Encounter: Payer: Self-pay | Admitting: Podiatry

## 2016-05-15 ENCOUNTER — Ambulatory Visit (INDEPENDENT_AMBULATORY_CARE_PROVIDER_SITE_OTHER): Payer: Medicare Other | Admitting: Podiatry

## 2016-05-15 VITALS — BP 149/71 | HR 63 | Resp 14

## 2016-05-15 DIAGNOSIS — E119 Type 2 diabetes mellitus without complications: Secondary | ICD-10-CM | POA: Diagnosis not present

## 2016-05-15 DIAGNOSIS — M79676 Pain in unspecified toe(s): Secondary | ICD-10-CM

## 2016-05-15 DIAGNOSIS — B351 Tinea unguium: Secondary | ICD-10-CM

## 2016-05-15 DIAGNOSIS — M79609 Pain in unspecified limb: Principal | ICD-10-CM

## 2016-05-15 NOTE — Progress Notes (Signed)
Patient ID: Kirsten Kelly, female   DOB: 12/09/1927, 82 y.o.   MRN: [id number] Complaint:  Visit Type: Patient returns to my office for continued preventative foot care services. Complaint: Patient states" my nails have grown long and thick and become painful to walk and wear shoes" Patient has been diagnosed with DM with no foot complications. The patient presents for preventative foot care services. No changes to ROS  Podiatric Exam: Vascular: dorsalis pedis and posterior tibial pulses are palpable bilateral. Capillary return is immediate. Temperature gradient is WNL. Skin turgor WNL  Sensorium: Normal Semmes Weinstein monofilament test. Normal tactile sensation bilaterally. Nail Exam: Pt has thick disfigured discolored nails with subungual debris noted bilateral entire nail hallux through fifth toenails Ulcer Exam: There is no evidence of ulcer or pre-ulcerative changes or infection. Orthopedic Exam: Muscle tone and strength are WNL. No limitations in general ROM. No crepitus or effusions noted. Foot type and digits show no abnormalities. HAV  B/L Skin: No Porokeratosis. No infection or ulcers  Diagnosis:  Onychomycosis, , Pain in right toe, pain in left toes  Treatment & Plan Procedures and Treatment: Consent by patient was obtained for treatment procedures. The patient understood the discussion of treatment and procedures well. All questions were answered thoroughly reviewed. Debridement of mycotic and hypertrophic toenails, 1 through 5 bilateral and clearing of subungual debris. No ulceration, no infection noted.  Return Visit-Office Procedure: Patient instructed to return to the office for a follow up visit 3 months for continued evaluation and treatment.    Severus Brodzinski DPM 

## 2016-06-12 DIAGNOSIS — E119 Type 2 diabetes mellitus without complications: Secondary | ICD-10-CM | POA: Diagnosis not present

## 2016-06-12 DIAGNOSIS — F325 Major depressive disorder, single episode, in full remission: Secondary | ICD-10-CM | POA: Diagnosis not present

## 2016-06-12 DIAGNOSIS — E784 Other hyperlipidemia: Secondary | ICD-10-CM | POA: Diagnosis not present

## 2016-06-12 DIAGNOSIS — I10 Essential (primary) hypertension: Secondary | ICD-10-CM | POA: Diagnosis not present

## 2016-08-14 ENCOUNTER — Encounter: Payer: Self-pay | Admitting: Podiatry

## 2016-08-14 ENCOUNTER — Ambulatory Visit (INDEPENDENT_AMBULATORY_CARE_PROVIDER_SITE_OTHER): Payer: Medicare Other | Admitting: Podiatry

## 2016-08-14 DIAGNOSIS — M2010 Hallux valgus (acquired), unspecified foot: Secondary | ICD-10-CM

## 2016-08-14 DIAGNOSIS — M79609 Pain in unspecified limb: Secondary | ICD-10-CM | POA: Diagnosis not present

## 2016-08-14 DIAGNOSIS — B351 Tinea unguium: Secondary | ICD-10-CM

## 2016-08-14 DIAGNOSIS — E119 Type 2 diabetes mellitus without complications: Secondary | ICD-10-CM

## 2016-08-14 NOTE — Progress Notes (Signed)
Patient ID: Kirsten Kelly, female   DOB: 12/09/1927, 83 y.o.   MRN: [id number] Complaint:  Visit Type: Patient returns to my office for continued preventative foot care services. Complaint: Patient states" my nails have grown long and thick and become painful to walk and wear shoes" Patient has been diagnosed with DM with no foot complications. The patient presents for preventative foot care services. No changes to ROS  Podiatric Exam: Vascular: dorsalis pedis and posterior tibial pulses are palpable bilateral. Capillary return is immediate. Temperature gradient is WNL. Skin turgor WNL  Sensorium: Normal Semmes Weinstein monofilament test. Normal tactile sensation bilaterally. Nail Exam: Pt has thick disfigured discolored nails with subungual debris noted bilateral entire nail hallux through fifth toenails Ulcer Exam: There is no evidence of ulcer or pre-ulcerative changes or infection. Orthopedic Exam: Muscle tone and strength are WNL. No limitations in general ROM. No crepitus or effusions noted. Foot type and digits show no abnormalities. HAV  B/L Skin: No Porokeratosis. No infection or ulcers  Diagnosis:  Onychomycosis, , Pain in right toe, pain in left toes  Treatment & Plan Procedures and Treatment: Consent by patient was obtained for treatment procedures. The patient understood the discussion of treatment and procedures well. All questions were answered thoroughly reviewed. Debridement of mycotic and hypertrophic toenails, 1 through 5 bilateral and clearing of subungual debris. No ulceration, no infection noted. Initiate diabetic shoe paperwork for DPN and HAV Return Visit-Office Procedure: Patient instructed to return to the office for a follow up visit 3 months for continued evaluation and treatment.    Helane GuntherGregory Felecia Stanfill DPM

## 2016-09-19 ENCOUNTER — Encounter (HOSPITAL_COMMUNITY): Payer: Self-pay | Admitting: *Deleted

## 2016-09-19 ENCOUNTER — Emergency Department (HOSPITAL_COMMUNITY): Payer: Medicare Other

## 2016-09-19 ENCOUNTER — Emergency Department (HOSPITAL_COMMUNITY)
Admission: EM | Admit: 2016-09-19 | Discharge: 2016-09-19 | Disposition: A | Discharge status: Home / Self Care | Payer: Medicare Other | Attending: Emergency Medicine | Admitting: Emergency Medicine

## 2016-09-19 DIAGNOSIS — E119 Type 2 diabetes mellitus without complications: Secondary | ICD-10-CM | POA: Insufficient documentation

## 2016-09-19 DIAGNOSIS — S42211A Unspecified displaced fracture of surgical neck of right humerus, initial encounter for closed fracture: Secondary | ICD-10-CM | POA: Diagnosis not present

## 2016-09-19 DIAGNOSIS — Z7982 Long term (current) use of aspirin: Secondary | ICD-10-CM | POA: Diagnosis not present

## 2016-09-19 DIAGNOSIS — M79601 Pain in right arm: Secondary | ICD-10-CM | POA: Diagnosis not present

## 2016-09-19 DIAGNOSIS — W06XXXA Fall from bed, initial encounter: Secondary | ICD-10-CM | POA: Diagnosis not present

## 2016-09-19 DIAGNOSIS — Y999 Unspecified external cause status: Secondary | ICD-10-CM | POA: Diagnosis not present

## 2016-09-19 DIAGNOSIS — I10 Essential (primary) hypertension: Secondary | ICD-10-CM | POA: Insufficient documentation

## 2016-09-19 DIAGNOSIS — S42291A Other displaced fracture of upper end of right humerus, initial encounter for closed fracture: Secondary | ICD-10-CM | POA: Diagnosis not present

## 2016-09-19 DIAGNOSIS — Y929 Unspecified place or not applicable: Secondary | ICD-10-CM | POA: Diagnosis not present

## 2016-09-19 DIAGNOSIS — Y939 Activity, unspecified: Secondary | ICD-10-CM | POA: Diagnosis not present

## 2016-09-19 DIAGNOSIS — T148XXA Other injury of unspecified body region, initial encounter: Secondary | ICD-10-CM | POA: Diagnosis not present

## 2016-09-19 DIAGNOSIS — S4991XA Unspecified injury of right shoulder and upper arm, initial encounter: Secondary | ICD-10-CM | POA: Diagnosis present

## 2016-09-19 MED ORDER — HYDROCODONE-ACETAMINOPHEN 5-325 MG PO TABS
1.0000 | ORAL_TABLET | Freq: Once | ORAL | Status: AC
Start: 2016-09-19 — End: 2016-09-19
  Administered 2016-09-19: 1 via ORAL
  Filled 2016-09-19: qty 1

## 2016-09-19 MED ORDER — HYDROCODONE-ACETAMINOPHEN 5-325 MG PO TABS: 1.0000 | ORAL_TABLET | Freq: Four times a day (QID) | ORAL | 0 refills | Status: DC | PRN

## 2016-09-19 NOTE — ED Triage Notes (Signed)
Per EMS, pt rolled out of bed this morning and fell on the floor. Pt was unable to get off floor. Pt complains of right upper arm pain, above elbow to shoulder. Pain is worse with movement.

## 2016-09-19 NOTE — Discharge Instructions (Signed)
Follow up with the orthopedist, Dr. Ophelia CharterYates, on Tuesday, 09/25/16. Call today for the appointment. Keep the shoulder sling/immobilizer on at all times. Sleep in a recliner. If you develop numbness or weakness in the right arm or new/worsening pain, return to the ER.

## 2016-09-19 NOTE — ED Notes (Signed)
Patient initialed signature pad but did not register.

## 2016-09-19 NOTE — ED Provider Notes (Signed)
[facility]-EMERGENCY DEPT Provider Note   CSN: [id number] Arrival date & time: 09/19/16  1418     History   Chief Complaint Chief Complaint  Patient presents with  . Fall  . Arm Pain    HPI Kirsten Kelly is a 83 y.o. female.  HPI  83 year old female presents with proximal right arm pain after a fall this morning. She was rolling over in bed and did not rouse how close she was today to the bed and fell to the ground, landing on her right shoulder. Has 9/10 pain. Did not hit her head, no headache. No other injuries. Placed in a sling/swath by EMS and brought here. Never had chest pain or dizziness. No prior orthopedic problems.  Past Medical History:  Diagnosis Date  . Diabetes mellitus   . Hypertension     Patient Active Problem List   Diagnosis Date Noted  . Diabetes mellitus 01/25/2011    Past Surgical History:  Procedure Laterality Date  . ABDOMINAL HYSTERECTOMY      OB History    No data available       Home Medications    Prior to Admission medications   Medication Sig Start Date End Date Taking? Authorizing Provider  aspirin 81 MG tablet Take 81 mg by mouth daily.    Historical Provider, MD  carvedilol (COREG) 6.25 MG tablet Take 6.25 mg by mouth 2 (two) times daily with a meal.    Historical Provider, MD  enalapril (VASOTEC) 20 MG tablet Take 20 mg by mouth daily.      Historical Provider, MD  HYDROcodone-acetaminophen (NORCO) 5-325 MG tablet Take 1 tablet by mouth every 6 (six) hours as needed for severe pain. 09/19/16   Pricilla LovelessScott Octavis Sheeler, MD  Linagliptin-Metformin HCl (JENTADUETO) 2.11-998 MG TABS Take by mouth.    Historical Provider, MD  olmesartan-hydrochlorothiazide (BENICAR HCT) 40-25 MG per tablet Take 1 tablet by mouth daily.      Historical Provider, MD  sertraline (ZOLOFT) 50 MG tablet Take 50 mg by mouth daily.    Historical Provider, MD    Family History No family history on file.  Social History Social History  Substance Use Topics  . Smoking  status: Never Smoker  . Smokeless tobacco: Never Used  . Alcohol use Yes     Comment: socially     Allergies   Patient has no known allergies.   Review of Systems Review of Systems  Musculoskeletal: Positive for arthralgias.  Neurological: Negative for syncope, weakness, numbness and headaches.  All other systems reviewed and are negative.    Physical Exam Updated Vital Signs BP 183/70 (BP Location: Left Arm)   Pulse 70   Temp 98.4 F (36.9 C) (Oral)   Resp 17   SpO2 96%   Physical Exam  Constitutional: She is oriented to person, place, and time. She appears well-developed and well-nourished.  HENT:  Head: Normocephalic and atraumatic.  Right Ear: External ear normal.  Left Ear: External ear normal.  Nose: Nose normal.  Eyes: Right eye exhibits no discharge. Left eye exhibits no discharge.  Cardiovascular: Normal rate and regular rhythm.   Pulses:      Radial pulses are 2+ on the right side.  Pulmonary/Chest: Effort normal.  Abdominal: She exhibits no distension.  Musculoskeletal:       Right shoulder: She exhibits decreased range of motion and tenderness. She exhibits no deformity, no laceration, normal pulse and normal strength.  Normal radial/ulnar/median nerve testing in distal RUE. Right shoulder/proximal humerus  tender but no open wounds or deformity.   Neurological: She is alert and oriented to person, place, and time.  Skin: Skin is warm and dry.  Nursing note and vitals reviewed.    ED Treatments / Results  Labs (all labs ordered are listed, but only abnormal results are displayed) Labs Reviewed - No data to display  EKG  EKG Interpretation None       Radiology Dg Shoulder Right  Result Date: 09/19/2016 CLINICAL DATA:  Pt states she feel out of her bed this morning onto right shoulder/upper arm - pain concentrated proximal right humerus EXAM: RIGHT SHOULDER - 2+ VIEW COMPARISON:  None. FINDINGS: Two views of the right shoulder submitted. There  is displaced subcapital fracture of the right proximal humerus. At least 1.6 cm separation of bony fragment. No shoulder dislocation. IMPRESSION: Displaced subcapital fracture of proximal right humerus. Electronically Signed   By: Natasha Mead M.D.   On: 09/19/2016 14:56   Dg Humerus Right  Result Date: 09/19/2016 CLINICAL DATA:  83 year old female with a history of fall and shoulder pain EXAM: RIGHT HUMERUS - 2+ VIEW COMPARISON:  None. FINDINGS: Acute comminuted fracture at the surgical neck of the right humerus with full shaft fracture displacement medially. Degenerative changes of the acromioclavicular joint. IMPRESSION: Acute comminuted fracture at the surgical neck of the right humerus with full shaft with displacement medially. Electronically Signed   By: Gilmer Mor D.O.   On: 09/19/2016 14:56    Procedures Procedures (including critical care time)  Medications Ordered in ED Medications  HYDROcodone-acetaminophen (NORCO/VICODIN) 5-325 MG per tablet 1 tablet (1 tablet Oral Given 09/19/16 1529)     Initial Impression / Assessment and Plan / ED Course  I have reviewed the triage vital signs and the nursing notes.  Pertinent labs & imaging results that were available during my care of the patient were reviewed by me and considered in my medical decision making (see chart for details).     Patient is NV intact. Pain controlled. No other injuries, is a closed fx. D/w Dr. Ophelia Charter, who offered patient admission and surgery tomorrow/2 days or see in clinic next week for surgery next week. Patient wants to go home and do outpatient f/u and surgery. This seems reasonable and no other injuries. D/c with sling immobilizer, pain control (discussed potential adverse effects), and return precautions.  Final Clinical Impressions(s) / ED Diagnoses   Final diagnoses:  Other closed displaced fracture of proximal end of right humerus, initial encounter    New Prescriptions New Prescriptions    HYDROCODONE-ACETAMINOPHEN (NORCO) 5-325 MG TABLET    Take 1 tablet by mouth every 6 (six) hours as needed for severe pain.     Pricilla Loveless, MD 09/19/16 [phone]

## 2016-09-21 ENCOUNTER — Emergency Department (HOSPITAL_COMMUNITY): Payer: Medicare Other

## 2016-09-21 ENCOUNTER — Emergency Department (HOSPITAL_COMMUNITY)
Admission: EM | Admit: 2016-09-21 | Discharge: 2016-09-21 | Disposition: A | Discharge status: Home / Self Care | Payer: Medicare Other | Attending: Emergency Medicine | Admitting: Emergency Medicine

## 2016-09-21 DIAGNOSIS — Y999 Unspecified external cause status: Secondary | ICD-10-CM | POA: Insufficient documentation

## 2016-09-21 DIAGNOSIS — R930 Abnormal findings on diagnostic imaging of skull and head, not elsewhere classified: Secondary | ICD-10-CM | POA: Insufficient documentation

## 2016-09-21 DIAGNOSIS — M545 Low back pain: Secondary | ICD-10-CM | POA: Diagnosis not present

## 2016-09-21 DIAGNOSIS — Y939 Activity, unspecified: Secondary | ICD-10-CM | POA: Insufficient documentation

## 2016-09-21 DIAGNOSIS — E119 Type 2 diabetes mellitus without complications: Secondary | ICD-10-CM | POA: Insufficient documentation

## 2016-09-21 DIAGNOSIS — W19XXXA Unspecified fall, initial encounter: Secondary | ICD-10-CM

## 2016-09-21 DIAGNOSIS — S299XXA Unspecified injury of thorax, initial encounter: Secondary | ICD-10-CM | POA: Diagnosis not present

## 2016-09-21 DIAGNOSIS — Z7982 Long term (current) use of aspirin: Secondary | ICD-10-CM | POA: Diagnosis not present

## 2016-09-21 DIAGNOSIS — Y929 Unspecified place or not applicable: Secondary | ICD-10-CM | POA: Diagnosis not present

## 2016-09-21 DIAGNOSIS — R51 Headache: Secondary | ICD-10-CM | POA: Diagnosis not present

## 2016-09-21 DIAGNOSIS — E860 Dehydration: Secondary | ICD-10-CM | POA: Diagnosis not present

## 2016-09-21 DIAGNOSIS — W06XXXA Fall from bed, initial encounter: Secondary | ICD-10-CM | POA: Diagnosis not present

## 2016-09-21 DIAGNOSIS — M5489 Other dorsalgia: Secondary | ICD-10-CM | POA: Diagnosis not present

## 2016-09-21 DIAGNOSIS — M549 Dorsalgia, unspecified: Secondary | ICD-10-CM | POA: Insufficient documentation

## 2016-09-21 DIAGNOSIS — Z79899 Other long term (current) drug therapy: Secondary | ICD-10-CM | POA: Diagnosis not present

## 2016-09-21 DIAGNOSIS — I10 Essential (primary) hypertension: Secondary | ICD-10-CM | POA: Diagnosis not present

## 2016-09-21 DIAGNOSIS — M546 Pain in thoracic spine: Secondary | ICD-10-CM | POA: Diagnosis not present

## 2016-09-21 DIAGNOSIS — T148XXA Other injury of unspecified body region, initial encounter: Secondary | ICD-10-CM | POA: Diagnosis not present

## 2016-09-21 DIAGNOSIS — S42211A Unspecified displaced fracture of surgical neck of right humerus, initial encounter for closed fracture: Secondary | ICD-10-CM | POA: Diagnosis not present

## 2016-09-21 DIAGNOSIS — M542 Cervicalgia: Secondary | ICD-10-CM | POA: Diagnosis not present

## 2016-09-21 LAB — URINALYSIS, ROUTINE W REFLEX MICROSCOPIC
BILIRUBIN URINE: NEGATIVE
Bacteria, UA: NONE SEEN
Glucose, UA: NEGATIVE mg/dL
HGB URINE DIPSTICK: NEGATIVE
Ketones, ur: 5 mg/dL — AB
LEUKOCYTES UA: NEGATIVE
NITRITE: NEGATIVE
PH: 6 (ref 5.0–8.0)
Protein, ur: 100 mg/dL — AB
Specific Gravity, Urine: 1.02 (ref 1.005–1.030)
Squamous Epithelial / LPF: NONE SEEN

## 2016-09-21 LAB — HEPATIC FUNCTION PANEL
ALBUMIN: 4.3 g/dL (ref 3.5–5.0)
ALT: 17 U/L (ref 14–54)
AST: 29 U/L (ref 15–41)
Alkaline Phosphatase: 61 U/L (ref 38–126)
BILIRUBIN DIRECT: 0.3 mg/dL (ref 0.1–0.5)
Indirect Bilirubin: 1.1 mg/dL — ABNORMAL HIGH (ref 0.3–0.9)
TOTAL PROTEIN: 8.9 g/dL — AB (ref 6.5–8.1)
Total Bilirubin: 1.4 mg/dL — ABNORMAL HIGH (ref 0.3–1.2)

## 2016-09-21 LAB — BASIC METABOLIC PANEL
ANION GAP: 12 (ref 5–15)
BUN: 15 mg/dL (ref 6–20)
CALCIUM: 9.8 mg/dL (ref 8.9–10.3)
CO2: 26 mmol/L (ref 22–32)
CREATININE: 0.49 mg/dL (ref 0.44–1.00)
Chloride: 97 mmol/L — ABNORMAL LOW (ref 101–111)
GFR calc Af Amer: >60 mL/min (ref >60)
GLUCOSE: 143 mg/dL — AB (ref 65–99)
Potassium: 3.5 mmol/L (ref 3.5–5.1)
Sodium: 135 mmol/L (ref 135–145)

## 2016-09-21 LAB — CK: Total CK: 358 U/L — ABNORMAL HIGH (ref 38–234)

## 2016-09-21 LAB — CBC WITH DIFFERENTIAL/PLATELET
BASOS ABS: 0 10*3/uL (ref 0.0–0.1)
BASOS PCT: 0 %
EOS ABS: 0.1 10*3/uL (ref 0.0–0.7)
EOS PCT: 1 %
HCT: 46.4 % — ABNORMAL HIGH (ref 36.0–46.0)
Hemoglobin: 15.8 g/dL — ABNORMAL HIGH (ref 12.0–15.0)
Lymphocytes Relative: 14 %
Lymphs Abs: 1.5 10*3/uL (ref 0.7–4.0)
MCH: 28.8 pg (ref 26.0–34.0)
MCHC: 34.1 g/dL (ref 30.0–36.0)
MCV: 84.7 fL (ref 78.0–100.0)
MONO ABS: 0.8 10*3/uL (ref 0.1–1.0)
MONOS PCT: 8 %
Neutro Abs: 8.4 10*3/uL — ABNORMAL HIGH (ref 1.7–7.7)
Neutrophils Relative %: 77 %
PLATELETS: 160 10*3/uL (ref 150–400)
RBC: 5.48 MIL/uL — ABNORMAL HIGH (ref 3.87–5.11)
RDW: 13.9 % (ref 11.5–15.5)
WBC: 10.9 10*3/uL — ABNORMAL HIGH (ref 4.0–10.5)

## 2016-09-21 LAB — I-STAT TROPONIN, ED: Troponin i, poc: 0.01 ng/mL (ref 0.00–0.08)

## 2016-09-21 MED ORDER — SODIUM CHLORIDE 0.9 % IV BOLUS (SEPSIS)
1000.0000 mL | Freq: Once | INTRAVENOUS | Status: AC
  Administered 2016-09-21: 1000 mL via INTRAVENOUS

## 2016-09-21 MED ORDER — SODIUM CHLORIDE 0.9 % IV BOLUS (SEPSIS)
500.0000 mL | Freq: Once | INTRAVENOUS | Status: AC
  Administered 2016-09-21: 500 mL via INTRAVENOUS

## 2016-09-21 MED ORDER — CARVEDILOL 6.25 MG PO TABS
6.2500 mg | ORAL_TABLET | Freq: Two times a day (BID) | ORAL | Status: DC
  Administered 2016-09-21: 6.25 mg via ORAL
  Filled 2016-09-21 (×2): qty 1

## 2016-09-21 MED ORDER — HYDROCHLOROTHIAZIDE 12.5 MG PO CAPS
25.0000 mg | ORAL_CAPSULE | Freq: Once | ORAL | Status: AC
  Administered 2016-09-21: 25 mg via ORAL
  Filled 2016-09-21: qty 2

## 2016-09-21 NOTE — ED Notes (Signed)
Received bedside report from Courtney RN 

## 2016-09-21 NOTE — Discharge Instructions (Signed)
Make sure you are drinking plenty of fluid. See Dr Ophelia CharterYates in the office on Tuesday for appoinment. Follow up with pcp. Return to the Ed if your symptoms worsen.

## 2016-09-21 NOTE — Progress Notes (Signed)
Faxed day shift ED provider notes to [facility], evening ED provider notes not available prior to the end of my shift.  Raymond GurneyL. J. Fenna Semel RN, BSN, CM

## 2016-09-21 NOTE — ED Provider Notes (Signed)
[facility] Please see prior note for complete history and physical. Awaiting lab results and CAT scan.Labs show mild dehydration.CK was mildly elevated. Urine without signs of infection. Hepatic function was normal. No leukocytosis.Troponin was negative.EKG without any acute changes from prior tracing. CT head and cervical spine showed no acute changes. Notes disc protrusion with spinal stenosis. No acute changes.Patient does have proximal humerus fracture due to fall 2 days ago. Seen in the ED at that time. Was offered admission with surgery patient declined wanting to follow-up in outpatient setting. Has appointment with Dr. Ophelia CharterYates next week for surgery. Patient with 2+ radial pulses. Sensation intact. Patient able to abduct and adduct the right shoulder. Axillary nerve is intact. Patient has minimal pain. Currently in a sling.Vital signs have been stable in the ED. She was given fluid hydration. Spoke with Dr. Gwenlyn PerkingMadera for possible hospital admission given patient's age and lives by herself at home. He felt that patient did not meet any inpatient criteria. Felt that she would not benefit from observation. Discussed patient with case managerWho discussed options with patient. Patient like to be discharged home. Feel that patient is safe for discharge home. She is able to ambulate in ED with normal gaitBy herself. She is overall tolerate by mouth fluids and food. Caregiver is at bedside will take patient home and stay with her tonight. Did [facility] Roda ShuttersXU [facility] concerning humeral fracture. States that this is something that can be seen in the outpatient setting. Has appointment with Dr. Ophelia CharterYates next week.Patient was given hydration in the ED. Encouraged by mouth hydration at home. Caregiver is requesting prescription for bedside commode. Patient does have a cane at home to help ambulate. Discussed with case Production designer, theatre/television/filmmanager. Will have patient evaluated in the a.m. For  home health and nursing aide. Face-to-face orders placed. Discussed disposition with Dr. Effie ShyWentz who is agreeable with the above plan. Pt is hemodynamically stable, in NAD, & able to ambulate in the ED. Pain has been managed & has no complaints prior to dc. Pt is comfortable with above plan and is stable for discharge at this time. All questions were answered prior to disposition. Strict return precautions for f/u to the ED were discussed.    Rise MuKenneth T Jackee Glasner, PA-C 09/22/16 [phone]    Mancel BaleElliott Wentz, MD 09/23/16 1340

## 2016-09-21 NOTE — Care Management (Signed)
[facility]

## 2016-09-21 NOTE — ED Triage Notes (Signed)
Per EMS, pt is complaining of 10/10 pain in lower back that started after falling out of the bed around 0800 this morning. Per EMS after moving around pt is reporting pain in the right arm which she recently broke. Pt denies LOC or syncope. Pt reports that she just slipped out of the bed.

## 2016-09-21 NOTE — ED Notes (Signed)
Bed: [id number] Expected date:  Expected time:  Means of arrival:  Comments: 83 yo f fall, low back pain

## 2016-09-21 NOTE — Care Management (Signed)
ED [facility] further evaluations. CM will follow up.

## 2016-09-21 NOTE — ED Provider Notes (Signed)
[facility]-EMERGENCY DEPT Provider Note   CSN: [id number] Arrival date & time: 09/21/16  1319     History   Chief Complaint No chief complaint on file.   HPI Kirsten Kelly is a 83 y.o. female.  HPI  Pt with hx DM, HTN, with recent fracture of the proximal right humerus p/w back pain that began earlier today.  States she did not fall but she laid down on the bed and had too much pain to get up.  Has chronic back pain in the same place. Has chronic urinary incontinence.  Patient's friend states pt wasn't answering her phone so she went to the house and found her on the ground unable to get up.  Denies fevers, abdominal pain, weakness or numbness of the lower extremities.   Pt was seen earlier this week for proximal humerus fracture, pt reports no change in this.    Past Medical History:  Diagnosis Date  . Diabetes mellitus   . Hypertension     Patient Active Problem List   Diagnosis Date Noted  . Diabetes mellitus 01/25/2011    Past Surgical History:  Procedure Laterality Date  . ABDOMINAL HYSTERECTOMY      OB History    No data available       Home Medications    Prior to Admission medications   Medication Sig Start Date End Date Taking? Authorizing Provider  aspirin 81 MG tablet Take 81 mg by mouth daily.   Yes Historical Provider, MD  carvedilol (COREG) 6.25 MG tablet Take 6.25 mg by mouth 2 (two) times daily with a meal.   Yes Historical Provider, MD  cetirizine (ZYRTEC) 10 MG tablet Take 10 mg by mouth daily.   Yes Historical Provider, MD  enalapril (VASOTEC) 20 MG tablet Take 20 mg by mouth daily.     Yes Historical Provider, MD  HYDROcodone-acetaminophen (NORCO) 5-325 MG tablet Take 1 tablet by mouth every 6 (six) hours as needed for severe pain. 09/19/16  Yes Pricilla Loveless, MD  Linagliptin-Metformin HCl (JENTADUETO) 2.11-998 MG TABS Take 1 tablet by mouth daily.    Yes Historical Provider, MD  olmesartan-hydrochlorothiazide (BENICAR HCT) 40-25 MG per tablet Take  1 tablet by mouth daily.     Yes Historical Provider, MD  sertraline (ZOLOFT) 50 MG tablet Take 50 mg by mouth daily.   Yes Historical Provider, MD    Family History No family history on file.  Social History Social History  Substance Use Topics  . Smoking status: Never Smoker  . Smokeless tobacco: Never Used  . Alcohol use Yes     Comment: socially     Allergies   Patient has no known allergies.   Review of Systems Review of Systems  All other systems reviewed and are negative.    Physical Exam Updated Vital Signs BP 194/90 (BP Location: Left Arm) Comment: pt states that she hasn't taken her BP medication today.  Pulse 64   Temp 98.3 F (36.8 C) (Oral)   Resp 18   SpO2 97%   Physical Exam  Constitutional: She appears well-developed and well-nourished. No distress.  HENT:  Head: Normocephalic and atraumatic.  Mouth/Throat: Mucous membranes are dry.  Neck: Neck supple.  Cardiovascular: Normal rate and regular rhythm.   Pulmonary/Chest: Effort normal and breath sounds normal. No respiratory distress. She has no wheezes. She has no rales.  Abdominal: Soft. She exhibits no distension. There is no tenderness. There is no rebound and no guarding.  Genitourinary:  Genitourinary Comments:  Pt smells strongly of malodorous urine.    Neurological: She is alert.  Skin: She is not diaphoretic.  Erythema of the lower back and buttocks without break in skin, nontender to palpation.   Nursing note and vitals reviewed.    ED Treatments / Results  Labs (all labs ordered are listed, but only abnormal results are displayed) Labs Reviewed  BASIC METABOLIC PANEL - Abnormal; Notable for the following:       Result Value   Chloride 97 (*)    Glucose, Bld 143 (*)    All other components within normal limits  CBC WITH DIFFERENTIAL/PLATELET - Abnormal; Notable for the following:    WBC 10.9 (*)    RBC 5.48 (*)    Hemoglobin 15.8 (*)    HCT 46.4 (*)    Neutro Abs 8.4 (*)     All other components within normal limits  URINALYSIS, ROUTINE W REFLEX MICROSCOPIC - Abnormal; Notable for the following:    Ketones, ur 5 (*)    Protein, ur 100 (*)    All other components within normal limits  CK - Abnormal; Notable for the following:    Total CK 358 (*)    All other components within normal limits  HEPATIC FUNCTION PANEL - Abnormal; Notable for the following:    Total Protein 8.9 (*)    Total Bilirubin 1.4 (*)    Indirect Bilirubin 1.1 (*)    All other components within normal limits  URINE CULTURE  I-STAT TROPOININ, ED    EKG  EKG Interpretation  Date/Time:  Friday September 21 2016 14:59:21 EST Ventricular Rate:  55 PR Interval:    QRS Duration: 95 QT Interval:  475 QTC Calculation: 455 R Axis:   -10 Text Interpretation:  Sinus rhythm Short PR interval Abnormal R-wave progression, early transition LVH with secondary repolarization abnormality No significant change since last tracing Confirmed by YAO  MD, DAVID ([id number]) on 09/21/2016 3:56:58 PM       Radiology No results found.  Procedures Procedures (including critical care time)  Medications Ordered in ED Medications  carvedilol (COREG) tablet 6.25 mg (6.25 mg Oral Given 09/21/16 1517)  sodium chloride 0.9 % bolus 500 mL (not administered)  sodium chloride 0.9 % bolus 1,000 mL (1,000 mLs Intravenous New Bag/Given 09/21/16 1543)  hydrochlorothiazide (MICROZIDE) capsule 25 mg (25 mg Oral Given 09/21/16 1516)     Initial Impression / Assessment and Plan / ED Course  I have reviewed the triage vital signs and the nursing notes.  Pertinent labs & imaging results that were available during my care of the patient were reviewed by me and considered in my medical decision making (see chart for details).  Clinical Course as of Sep 22 1631  Fri Sep 21, 2016  1442 Friend notes patient was not answering phone, she went to her house and found her on the ground.  Patient told her she had only been there since  the morning but she suspects it was longer.    [EW]    Clinical Course User Index [EW] Trixie DredgeEmily Djeneba Barsch, PA-C   Afebrile nontoxic patient who was found on the ground unable to get up by her friend.  By the smell and the look of her skin, I suspect patient has been stuck on the ground for some time, lying in her own urine.  UA does not appear infected.   IVF given.  Pt also seen and examined by Dr Silverio LayYao.  Labs and CT/xrays pending at change of  shift.  Pt signed out to Azucena Kuba, PA-C, at change of shift pending the rest of workup and disposition.    Final Clinical Impressions(s) / ED Diagnoses   Final diagnoses:  Fall    New Prescriptions New Prescriptions   No medications on file     Trixie Dredge, PA-C 09/21/16 1634    Charlynne Pander, MD 09/22/16 [phone]

## 2016-09-21 NOTE — ED Notes (Signed)
Pt ambulated in room, tolerated well, independent and steady gait.  Pt also tolerated fluids well.  MD aware.

## 2016-09-21 NOTE — Progress Notes (Signed)
Notified by ED secretary that patient needed follow up on services, into room pt is alert and oriented right arm in sling, 2 caregivers in room.  Explained to patient and caregivers that patient did not meet medical necessity for an admission as Observation, Hospitalist nor Orthopedics had any other medical services to provide patient.  Patient receiving IV fluids in the ED for mild dehydration per ED provider.  Caregiver stated she was willing to take patient home tonight to her home ( caregiver's home)  if they could just get discharged, patient also in agreement to going home to caregiver's house, however patient would be returning her own home on tomorrow.  Outside of room caregiver states patient would not stay with either of them for more than a night  because she wanted to be at home with her dog.  Spoke to AllstateProvider Leaphart, informing him of patient and caregiver's current thoughts and the plan to go home with home health.  Provided caregiver's with [facility] contact information and explained they would need to call tomorrow when triage nurses are in the office to find out if patient meets criteria for home health.  Face to Face done, HH orders for RN for safety evaluation, Nurses Assistant, Physical Therapy.  Also requested a script for Bedside commode.  No other  CM needs at this time.  Raymond GurneyL. J. Brittnay Pigman RN, BSN, CM

## 2016-09-22 LAB — URINE CULTURE: Culture: NO GROWTH

## 2016-09-25 ENCOUNTER — Encounter (INDEPENDENT_AMBULATORY_CARE_PROVIDER_SITE_OTHER): Payer: Self-pay | Admitting: Orthopaedic Surgery

## 2016-09-25 ENCOUNTER — Ambulatory Visit (INDEPENDENT_AMBULATORY_CARE_PROVIDER_SITE_OTHER): Payer: Self-pay

## 2016-09-25 ENCOUNTER — Ambulatory Visit (INDEPENDENT_AMBULATORY_CARE_PROVIDER_SITE_OTHER): Payer: Medicare Other | Admitting: Orthopaedic Surgery

## 2016-09-25 VITALS — BP 128/71 | HR 59 | Ht 66.0 in | Wt 176.0 lb

## 2016-09-25 DIAGNOSIS — S42291A Other displaced fracture of upper end of right humerus, initial encounter for closed fracture: Secondary | ICD-10-CM

## 2016-09-25 MED ORDER — TRAMADOL HCL 50 MG PO TABS: 50.0000 mg | ORAL_TABLET | Freq: Four times a day (QID) | ORAL | 0 refills | Status: DC | PRN

## 2016-09-25 MED ORDER — HYDROCODONE-ACETAMINOPHEN 5-325 MG PO TABS: 1.0000 | ORAL_TABLET | Freq: Four times a day (QID) | ORAL | 0 refills | Status: DC | PRN

## 2016-09-25 NOTE — Progress Notes (Signed)
   Office Visit Note   Patient: Kirsten Kelly           Date of Birth: 1927-12-26           MRN: [id number] Visit Date: 09/25/2016              Requested by: Laurann Montanaynthia White, MD [id number] [place], [place] [address] PCP: Cala BradfordWHITE,CYNTHIA S, MD   Assessment & Plan: Visit Diagnoses:  1. Other closed displaced fracture of proximal end of right humerus, initial encounter     Plan: Shoulder x-ray show that the fracture has further displacement. Patient also lives alone. Recommend ORIF procedure and then discharge to a skilled facility.  Surgical procedure discussed. All questions answered. Patient agrees with treatment plan.  Follow-Up Instructions: No Follow-up on file.   Orders:  Orders Placed This Encounter  Procedures  . XR Humerus Right   No orders of the defined types were placed in this encounter.     Procedures: No procedures performed   Clinical Data: No additional findings.   Subjective: Chief Complaint  Patient presents with  . Right Shoulder - Pain, Fracture    Patient fell out of bed 09/19/2016 and sustained right proximal humerus fracture. [facility] and has x-rays on [place]. She is not in a sling today, but states that she has been in one. She removed it today to get dressed and it was too painful to put it back on. She was given hydrocodone but it "makes her crazy". She is taking tylenol with not a lot of relief.     Review of Systems   Objective: Vital Signs: BP 128/71   Pulse (!) 59   Ht 5\' 6"  (1.676 m)   Wt 176 lb (79.8 kg)   BMI 28.41 kg/m   Physical Exam  Ortho Exam  Specialty Comments:  No specialty comments available.  Imaging: No results found.   PMFS History: Patient Active Problem List   Diagnosis Date Noted  . Diabetes mellitus 01/25/2011   Past Medical History:  Diagnosis Date  . Diabetes mellitus   . Hypertension     No family history on file.  Past Surgical History:  Procedure  Laterality Date  . ABDOMINAL HYSTERECTOMY     Social History   Occupational History  . Not on file.   Social History Main Topics  . Smoking status: Never Smoker  . Smokeless tobacco: Never Used  . Alcohol use Yes     Comment: socially  . Drug use: No  . Sexual activity: Not on file

## 2016-09-27 ENCOUNTER — Other Ambulatory Visit (INDEPENDENT_AMBULATORY_CARE_PROVIDER_SITE_OTHER): Payer: Self-pay | Admitting: Orthopaedic Surgery

## 2016-09-27 ENCOUNTER — Encounter (HOSPITAL_COMMUNITY): Payer: Self-pay | Admitting: *Deleted

## 2016-09-27 DIAGNOSIS — S42201A Unspecified fracture of upper end of right humerus, initial encounter for closed fracture: Secondary | ICD-10-CM

## 2016-09-27 NOTE — Progress Notes (Signed)
Spoke with pt's caregiver, Geanie LoganWilla Jones (after getting permission from pt) for pre-op call. Pt has no known cardiac history. Pt is a type 2 diabetic. Last A1C was 6.1 on 06/12/16. Pt states she checks her fasting blood sugar but when asked what they usually run she said in the 30's. Ms. Yetta BarreJones said that she's not sure if that is correct. Instructed Ms Yetta BarreJones to have pt check her blood sugar in the AM when she gets up and every 2 hours until she leaves for the hospital. If blood sugar is 70 or below, treat with 1/2 cup of clear juice (apple or cranberry) and recheck blood sugar 15 minutes after drinking juice. If blood sugar continues to be 70 or below, call the Short Stay department and ask to speak to a nurse. Ms. Yetta BarreJones voiced understanding.

## 2016-09-28 ENCOUNTER — Inpatient Hospital Stay (HOSPITAL_COMMUNITY): Payer: Medicare Other | Admitting: Critical Care Medicine

## 2016-09-28 ENCOUNTER — Inpatient Hospital Stay (HOSPITAL_COMMUNITY): Payer: Medicare Other

## 2016-09-28 ENCOUNTER — Inpatient Hospital Stay (HOSPITAL_COMMUNITY)
Admission: RE | Admit: 2016-09-28 | Discharge: 2016-10-02 | DRG: 494 | Disposition: A | Discharge status: Skilled Nursing Facility | Payer: Medicare Other | Source: Ambulatory Visit | Attending: Orthopaedic Surgery | Admitting: Orthopaedic Surgery

## 2016-09-28 ENCOUNTER — Encounter (HOSPITAL_COMMUNITY)
Admission: RE | Disposition: A | Discharge status: Skilled Nursing Facility | Payer: Self-pay | Source: Ambulatory Visit | Attending: Orthopaedic Surgery

## 2016-09-28 ENCOUNTER — Encounter (HOSPITAL_COMMUNITY): Payer: Self-pay | Admitting: *Deleted

## 2016-09-28 DIAGNOSIS — Z79899 Other long term (current) drug therapy: Secondary | ICD-10-CM | POA: Diagnosis not present

## 2016-09-28 DIAGNOSIS — S42291D Other displaced fracture of upper end of right humerus, subsequent encounter for fracture with routine healing: Secondary | ICD-10-CM | POA: Diagnosis not present

## 2016-09-28 DIAGNOSIS — W06XXXA Fall from bed, initial encounter: Secondary | ICD-10-CM | POA: Diagnosis present

## 2016-09-28 DIAGNOSIS — S42309A Unspecified fracture of shaft of humerus, unspecified arm, initial encounter for closed fracture: Secondary | ICD-10-CM | POA: Diagnosis not present

## 2016-09-28 DIAGNOSIS — F32A Depression, unspecified: Secondary | ICD-10-CM

## 2016-09-28 DIAGNOSIS — I10 Essential (primary) hypertension: Secondary | ICD-10-CM | POA: Diagnosis present

## 2016-09-28 DIAGNOSIS — Z7982 Long term (current) use of aspirin: Secondary | ICD-10-CM

## 2016-09-28 DIAGNOSIS — R488 Other symbolic dysfunctions: Secondary | ICD-10-CM | POA: Diagnosis not present

## 2016-09-28 DIAGNOSIS — S42211A Unspecified displaced fracture of surgical neck of right humerus, initial encounter for closed fracture: Principal | ICD-10-CM | POA: Diagnosis present

## 2016-09-28 DIAGNOSIS — Z8659 Personal history of other mental and behavioral disorders: Secondary | ICD-10-CM

## 2016-09-28 DIAGNOSIS — G8918 Other acute postprocedural pain: Secondary | ICD-10-CM | POA: Diagnosis not present

## 2016-09-28 DIAGNOSIS — Z419 Encounter for procedure for purposes other than remedying health state, unspecified: Secondary | ICD-10-CM

## 2016-09-28 DIAGNOSIS — S42201A Unspecified fracture of upper end of right humerus, initial encounter for closed fracture: Secondary | ICD-10-CM | POA: Diagnosis not present

## 2016-09-28 DIAGNOSIS — E119 Type 2 diabetes mellitus without complications: Secondary | ICD-10-CM | POA: Diagnosis not present

## 2016-09-28 DIAGNOSIS — F329 Major depressive disorder, single episode, unspecified: Secondary | ICD-10-CM

## 2016-09-28 DIAGNOSIS — M25511 Pain in right shoulder: Secondary | ICD-10-CM | POA: Diagnosis not present

## 2016-09-28 DIAGNOSIS — R531 Weakness: Secondary | ICD-10-CM | POA: Diagnosis not present

## 2016-09-28 DIAGNOSIS — S42209A Unspecified fracture of upper end of unspecified humerus, initial encounter for closed fracture: Secondary | ICD-10-CM | POA: Insufficient documentation

## 2016-09-28 DIAGNOSIS — M1990 Unspecified osteoarthritis, unspecified site: Secondary | ICD-10-CM | POA: Diagnosis not present

## 2016-09-28 DIAGNOSIS — M6281 Muscle weakness (generalized): Secondary | ICD-10-CM | POA: Diagnosis not present

## 2016-09-28 DIAGNOSIS — Z9181 History of falling: Secondary | ICD-10-CM | POA: Diagnosis not present

## 2016-09-28 HISTORY — PX: ORIF HUMERUS FRACTURE: SHX2126

## 2016-09-28 HISTORY — DX: Unspecified osteoarthritis, unspecified site: M19.90

## 2016-09-28 LAB — COMPREHENSIVE METABOLIC PANEL
ALT: 21 U/L (ref 14–54)
ANION GAP: 9 (ref 5–15)
AST: 35 U/L (ref 15–41)
Albumin: 3.3 g/dL — ABNORMAL LOW (ref 3.5–5.0)
Alkaline Phosphatase: 56 U/L (ref 38–126)
BUN: 26 mg/dL — ABNORMAL HIGH (ref 6–20)
CHLORIDE: 101 mmol/L (ref 101–111)
CO2: 30 mmol/L (ref 22–32)
CREATININE: 0.73 mg/dL (ref 0.44–1.00)
Calcium: 9.7 mg/dL (ref 8.9–10.3)
Glucose, Bld: 158 mg/dL — ABNORMAL HIGH (ref 65–99)
Potassium: 4.7 mmol/L (ref 3.5–5.1)
Sodium: 140 mmol/L (ref 135–145)
Total Bilirubin: 2.1 mg/dL — ABNORMAL HIGH (ref 0.3–1.2)
Total Protein: 7.4 g/dL (ref 6.5–8.1)

## 2016-09-28 LAB — CBC
HCT: 39.6 % (ref 36.0–46.0)
Hemoglobin: 13 g/dL (ref 12.0–15.0)
MCH: 28.7 pg (ref 26.0–34.0)
MCHC: 32.8 g/dL (ref 30.0–36.0)
MCV: 87.4 fL (ref 78.0–100.0)
PLATELETS: 214 10*3/uL (ref 150–400)
RBC: 4.53 MIL/uL (ref 3.87–5.11)
RDW: 14.7 % (ref 11.5–15.5)
WBC: 9.6 10*3/uL (ref 4.0–10.5)

## 2016-09-28 LAB — GLUCOSE, CAPILLARY
GLUCOSE-CAPILLARY: 143 mg/dL — AB (ref 65–99)
GLUCOSE-CAPILLARY: 115 mg/dL — AB (ref 65–99)
GLUCOSE-CAPILLARY: 112 mg/dL — AB (ref 65–99)
Glucose-Capillary: 175 mg/dL — ABNORMAL HIGH (ref 65–99)

## 2016-09-28 SURGERY — OPEN REDUCTION INTERNAL FIXATION (ORIF) PROXIMAL HUMERUS FRACTURE
Anesthesia: General | Site: Shoulder | Laterality: Right

## 2016-09-28 MED ORDER — SUGAMMADEX SODIUM 200 MG/2ML IV SOLN
INTRAVENOUS | Status: DC | PRN
  Administered 2016-09-28: 200 mg via INTRAVENOUS

## 2016-09-28 MED ORDER — MIDAZOLAM HCL 2 MG/2ML IJ SOLN
INTRAMUSCULAR | Status: AC
  Filled 2016-09-28: qty 2

## 2016-09-28 MED ORDER — LIDOCAINE HCL (CARDIAC) 20 MG/ML IV SOLN
INTRAVENOUS | Status: DC | PRN
  Administered 2016-09-28: 60 mg via INTRAVENOUS

## 2016-09-28 MED ORDER — DOCUSATE SODIUM 100 MG PO CAPS
100.0000 mg | ORAL_CAPSULE | Freq: Two times a day (BID) | ORAL | Status: DC
  Administered 2016-09-29 – 2016-10-02 (×8): 100 mg via ORAL
  Filled 2016-09-28 (×8): qty 1

## 2016-09-28 MED ORDER — MIDAZOLAM HCL 2 MG/2ML IJ SOLN: 1.0000 mg | INTRAMUSCULAR | Status: DC | PRN

## 2016-09-28 MED ORDER — OLMESARTAN MEDOXOMIL-HCTZ 40-25 MG PO TABS: 1.0000 | ORAL_TABLET | Freq: Every day | ORAL | Status: DC

## 2016-09-28 MED ORDER — METOCLOPRAMIDE HCL 5 MG/ML IJ SOLN: 5.0000 mg | Freq: Three times a day (TID) | INTRAMUSCULAR | Status: DC | PRN

## 2016-09-28 MED ORDER — ENALAPRIL MALEATE 5 MG PO TABS: 20.0000 mg | ORAL_TABLET | Freq: Every day | ORAL | Status: DC

## 2016-09-28 MED ORDER — METFORMIN HCL 500 MG PO TABS
1000.0000 mg | ORAL_TABLET | Freq: Every day | ORAL | Status: DC
  Administered 2016-09-29 – 2016-10-02 (×4): 1000 mg via ORAL
  Filled 2016-09-28 (×4): qty 2

## 2016-09-28 MED ORDER — ASPIRIN EC 81 MG PO TBEC
81.0000 mg | DELAYED_RELEASE_TABLET | Freq: Every day | ORAL | Status: DC
  Administered 2016-09-28 – 2016-10-02 (×5): 81 mg via ORAL
  Filled 2016-09-28 (×5): qty 1

## 2016-09-28 MED ORDER — CARVEDILOL 6.25 MG PO TABS
6.2500 mg | ORAL_TABLET | Freq: Two times a day (BID) | ORAL | Status: DC
  Administered 2016-09-28 – 2016-10-02 (×7): 6.25 mg via ORAL
  Filled 2016-09-28 (×7): qty 1

## 2016-09-28 MED ORDER — METOCLOPRAMIDE HCL 5 MG PO TABS: 5.0000 mg | ORAL_TABLET | Freq: Three times a day (TID) | ORAL | Status: DC | PRN

## 2016-09-28 MED ORDER — CEFAZOLIN SODIUM-DEXTROSE 2-4 GM/100ML-% IV SOLN
INTRAVENOUS | Status: AC
Start: 2016-09-28 — End: 2016-09-28
  Filled 2016-09-28: qty 100

## 2016-09-28 MED ORDER — ROCURONIUM BROMIDE 100 MG/10ML IV SOLN
INTRAVENOUS | Status: DC | PRN
  Administered 2016-09-28: 50 mg via INTRAVENOUS

## 2016-09-28 MED ORDER — SERTRALINE HCL 50 MG PO TABS
50.0000 mg | ORAL_TABLET | Freq: Every day | ORAL | Status: DC
  Administered 2016-09-28 – 2016-10-02 (×5): 50 mg via ORAL
  Filled 2016-09-28 (×5): qty 1

## 2016-09-28 MED ORDER — GLYCOPYRROLATE 0.2 MG/ML IJ SOLN
INTRAMUSCULAR | Status: DC | PRN
  Administered 2016-09-28 (×2): 0.2 mg via INTRAVENOUS

## 2016-09-28 MED ORDER — ONDANSETRON HCL 4 MG/2ML IJ SOLN
INTRAMUSCULAR | Status: DC | PRN
  Administered 2016-09-28: 4 mg via INTRAVENOUS

## 2016-09-28 MED ORDER — SENNOSIDES-DOCUSATE SODIUM 8.6-50 MG PO TABS: 1.0000 | ORAL_TABLET | Freq: Every evening | ORAL | Status: DC | PRN

## 2016-09-28 MED ORDER — LORATADINE 10 MG PO TABS
10.0000 mg | ORAL_TABLET | Freq: Every day | ORAL | Status: DC
  Administered 2016-09-28 – 2016-10-02 (×5): 10 mg via ORAL
  Filled 2016-09-28 (×5): qty 1

## 2016-09-28 MED ORDER — ACETAMINOPHEN 325 MG PO TABS
650.0000 mg | ORAL_TABLET | Freq: Four times a day (QID) | ORAL | Status: DC | PRN
  Administered 2016-09-30: 650 mg via ORAL
  Filled 2016-09-28: qty 2

## 2016-09-28 MED ORDER — ACETAMINOPHEN 650 MG RE SUPP: 650.0000 mg | Freq: Four times a day (QID) | RECTAL | Status: DC | PRN

## 2016-09-28 MED ORDER — LACTATED RINGERS IV SOLN
INTRAVENOUS | Status: DC
  Administered 2016-09-28: 08:00:00 via INTRAVENOUS

## 2016-09-28 MED ORDER — TRAMADOL HCL 50 MG PO TABS
50.0000 mg | ORAL_TABLET | Freq: Four times a day (QID) | ORAL | Status: DC | PRN
  Administered 2016-09-29 – 2016-10-02 (×6): 50 mg via ORAL
  Filled 2016-09-28 (×6): qty 1

## 2016-09-28 MED ORDER — ONDANSETRON HCL 4 MG/2ML IJ SOLN: 4.0000 mg | Freq: Four times a day (QID) | INTRAMUSCULAR | Status: DC | PRN

## 2016-09-28 MED ORDER — FENTANYL CITRATE (PF) 100 MCG/2ML IJ SOLN
50.0000 ug | INTRAMUSCULAR | Status: DC | PRN
  Administered 2016-09-28: 50 ug via INTRAVENOUS

## 2016-09-28 MED ORDER — HYDROCHLOROTHIAZIDE 25 MG PO TABS
25.0000 mg | ORAL_TABLET | Freq: Every day | ORAL | Status: DC
  Administered 2016-09-29 – 2016-10-02 (×4): 25 mg via ORAL
  Filled 2016-09-28 (×4): qty 1

## 2016-09-28 MED ORDER — FENTANYL CITRATE (PF) 100 MCG/2ML IJ SOLN
INTRAMUSCULAR | Status: AC
  Administered 2016-09-28: 50 ug via INTRAVENOUS
  Filled 2016-09-28: qty 2

## 2016-09-28 MED ORDER — FENTANYL CITRATE (PF) 100 MCG/2ML IJ SOLN: 25.0000 ug | INTRAMUSCULAR | Status: DC | PRN

## 2016-09-28 MED ORDER — SODIUM CHLORIDE 0.9 % IV SOLN
INTRAVENOUS | Status: DC
  Administered 2016-09-29: 02:00:00 via INTRAVENOUS

## 2016-09-28 MED ORDER — CHLORHEXIDINE GLUCONATE 4 % EX LIQD: 60.0000 mL | Freq: Once | CUTANEOUS | Status: DC

## 2016-09-28 MED ORDER — PHENYLEPHRINE HCL 10 MG/ML IJ SOLN
INTRAVENOUS | Status: DC | PRN
  Administered 2016-09-28: 25 ug/min via INTRAVENOUS

## 2016-09-28 MED ORDER — FENTANYL CITRATE (PF) 100 MCG/2ML IJ SOLN
INTRAMUSCULAR | Status: AC
  Filled 2016-09-28: qty 2

## 2016-09-28 MED ORDER — LINAGLIPTIN-METFORMIN HCL 2.5-1000 MG PO TABS: 1.0000 | ORAL_TABLET | Freq: Every day | ORAL | Status: DC

## 2016-09-28 MED ORDER — LINAGLIPTIN 5 MG PO TABS
5.0000 mg | ORAL_TABLET | Freq: Every day | ORAL | Status: DC
  Administered 2016-09-29 – 2016-10-02 (×4): 5 mg via ORAL
  Filled 2016-09-28 (×5): qty 1

## 2016-09-28 MED ORDER — MENTHOL 3 MG MT LOZG: 1.0000 | LOZENGE | OROMUCOSAL | Status: DC | PRN

## 2016-09-28 MED ORDER — 0.9 % SODIUM CHLORIDE (POUR BTL) OPTIME
TOPICAL | Status: DC | PRN
  Administered 2016-09-28: 1000 mL

## 2016-09-28 MED ORDER — CEFAZOLIN SODIUM-DEXTROSE 2-4 GM/100ML-% IV SOLN
2.0000 g | INTRAVENOUS | Status: AC
  Administered 2016-09-28: 2 g via INTRAVENOUS

## 2016-09-28 MED ORDER — PROPOFOL 10 MG/ML IV BOLUS
INTRAVENOUS | Status: DC | PRN
  Administered 2016-09-28: 100 mg via INTRAVENOUS

## 2016-09-28 MED ORDER — BUPIVACAINE-EPINEPHRINE (PF) 0.5% -1:200000 IJ SOLN
INTRAMUSCULAR | Status: DC | PRN
  Administered 2016-09-28: 30 mL via PERINEURAL

## 2016-09-28 MED ORDER — IRBESARTAN 150 MG PO TABS
150.0000 mg | ORAL_TABLET | Freq: Every day | ORAL | Status: DC
  Administered 2016-09-29 – 2016-10-02 (×4): 150 mg via ORAL
  Filled 2016-09-28 (×4): qty 1

## 2016-09-28 MED ORDER — PHENOL 1.4 % MT LIQD: 1.0000 | OROMUCOSAL | Status: DC | PRN

## 2016-09-28 MED ORDER — ONDANSETRON HCL 4 MG PO TABS: 4.0000 mg | ORAL_TABLET | Freq: Four times a day (QID) | ORAL | Status: DC | PRN

## 2016-09-28 MED ORDER — PROPOFOL 10 MG/ML IV BOLUS
INTRAVENOUS | Status: AC
  Filled 2016-09-28: qty 20

## 2016-09-28 SURGICAL SUPPLY — 57 items
BENZOIN TINCTURE PRP APPL 2/3 (GAUZE/BANDAGES/DRESSINGS) ×3 IMPLANT
BIT DRILL 3.2 (BIT) ×4
BIT DRILL 3.2XCALB NS DISP (BIT) ×2 IMPLANT
BIT DRILL CALIBRATED 2.7 (BIT) ×2 IMPLANT
BIT DRILL CALIBRATED 2.7MM (BIT) ×1
BIT DRL 3.2XCALB NS DISP (BIT) ×2
CLOSURE WOUND 1/2 X4 (GAUZE/BANDAGES/DRESSINGS) ×1
COVER SURGICAL LIGHT HANDLE (MISCELLANEOUS) ×3 IMPLANT
DRAPE C-ARM 42X72 X-RAY (DRAPES) ×3 IMPLANT
DRAPE IMP U-DRAPE 54X76 (DRAPES) ×3 IMPLANT
DRAPE INCISE IOBAN 66X45 STRL (DRAPES) IMPLANT
DRAPE SURG 17X23 STRL (DRAPES) ×3 IMPLANT
DRAPE U-SHAPE 47X51 STRL (DRAPES) ×3 IMPLANT
DRSG EMULSION OIL 3X3 NADH (GAUZE/BANDAGES/DRESSINGS) ×3 IMPLANT
DRSG MEPILEX BORDER 4X12 (GAUZE/BANDAGES/DRESSINGS) ×3 IMPLANT
ELECT REM PT RETURN 9FT ADLT (ELECTROSURGICAL) ×3
ELECTRODE REM PT RTRN 9FT ADLT (ELECTROSURGICAL) ×1 IMPLANT
GAUZE SPONGE 4X4 12PLY STRL (GAUZE/BANDAGES/DRESSINGS) ×3 IMPLANT
GLOVE BIOGEL PI IND STRL 8 (GLOVE) ×2 IMPLANT
GLOVE BIOGEL PI INDICATOR 8 (GLOVE) ×4
GLOVE ORTHO TXT STRL SZ7.5 (GLOVE) ×6 IMPLANT
GOWN STRL REUS W/ TWL LRG LVL3 (GOWN DISPOSABLE) ×1 IMPLANT
GOWN STRL REUS W/ TWL XL LVL3 (GOWN DISPOSABLE) ×1 IMPLANT
GOWN STRL REUS W/TWL 2XL LVL3 (GOWN DISPOSABLE) ×3 IMPLANT
GOWN STRL REUS W/TWL LRG LVL3 (GOWN DISPOSABLE) ×2
GOWN STRL REUS W/TWL XL LVL3 (GOWN DISPOSABLE) ×2
K-WIRE 2X5 SS THRDED S3 (WIRE) ×6
KIT BASIN OR (CUSTOM PROCEDURE TRAY) ×3 IMPLANT
KIT ROOM TURNOVER OR (KITS) ×3 IMPLANT
KWIRE 2X5 SS THRDED S3 (WIRE) ×2 IMPLANT
MANIFOLD NEPTUNE II (INSTRUMENTS) ×3 IMPLANT
NEEDLE HYPO 25GX1X1/2 BEV (NEEDLE) IMPLANT
NS IRRIG 1000ML POUR BTL (IV SOLUTION) ×3 IMPLANT
PACK SHOULDER (CUSTOM PROCEDURE TRAY) ×3 IMPLANT
PACK UNIVERSAL I (CUSTOM PROCEDURE TRAY) ×3 IMPLANT
PAD ARMBOARD 7.5X6 YLW CONV (MISCELLANEOUS) ×6 IMPLANT
PEG LOCKING 3.2MMX46 (Peg) ×3 IMPLANT
PEG LOCKING 3.2MMX54 (Peg) ×6 IMPLANT
PEG LOCKING 3.2X34 (Screw) ×3 IMPLANT
PEG LOCKING 3.2X38 (Screw) ×6 IMPLANT
PEG LOCKING 3.2X40 (Peg) ×6 IMPLANT
PEG LOCKING 3.2X42 (Screw) ×3 IMPLANT
PLATE 3HOLE HUMERUS PROX RT (Plate) ×3 IMPLANT
SCREW CORTICAL LOW PROF 3.5X32 (Screw) ×6 IMPLANT
SCREW CORTICAL LOW PROF 3.5X34 (Screw) ×3 IMPLANT
SLEEVE MEASURING 3.2 (BIT) ×3 IMPLANT
SPONGE LAP 18X18 X RAY DECT (DISPOSABLE) ×6 IMPLANT
STRIP CLOSURE SKIN 1/2X4 (GAUZE/BANDAGES/DRESSINGS) ×2 IMPLANT
SUCTION FRAZIER HANDLE 10FR (MISCELLANEOUS) ×2
SUCTION TUBE FRAZIER 10FR DISP (MISCELLANEOUS) ×1 IMPLANT
SUT FIBERWIRE #2 38 T-5 BLUE (SUTURE)
SUT VIC AB 2-0 CT1 27 (SUTURE) ×2
SUT VIC AB 2-0 CT1 TAPERPNT 27 (SUTURE) ×1 IMPLANT
SUT VIC AB 3-0 FS2 27 (SUTURE) ×3 IMPLANT
SUTURE FIBERWR #2 38 T-5 BLUE (SUTURE) IMPLANT
SYR CONTROL 10ML LL (SYRINGE) IMPLANT
WATER STERILE IRR 1000ML POUR (IV SOLUTION) ×3 IMPLANT

## 2016-09-28 NOTE — Anesthesia Procedure Notes (Signed)
Anesthesia Regional Block: Interscalene brachial plexus block   Pre-Anesthetic Checklist: ,, timeout performed, Correct Patient, Correct Site, Correct Laterality, Correct Procedure, Correct Position, site marked, Risks and benefits discussed, pre-op evaluation,  At surgeon's request and post-op pain management  Laterality: Right  Prep: Maximum Sterile Barrier Precautions used, chloraprep       Needles:  Injection technique: Single-shot  Needle Type: Echogenic Stimulator Needle     Needle Length: 5cm  Needle Gauge: 22     Additional Needles:   Procedures: ultrasound guided, nerve stimulator,,,,,,   Nerve Stimulator or Paresthesia:  Response: Biceps response,   Additional Responses:   Narrative:  Start time: 09/28/2016 9:26 AM End time: 09/28/2016 9:36 AM Injection made incrementally with aspirations every 5 mL. Anesthesiologist: Gaynelle AduFITZGERALD, Ricarda Atayde  Additional Notes: 2% Lidocaine skin wheel.

## 2016-09-28 NOTE — Anesthesia Preprocedure Evaluation (Addendum)
Anesthesia Evaluation  Patient identified by MRN, date of birth, ID band Patient awake    Reviewed: Allergy & Precautions, H&P , NPO status , Patient's Chart, lab work & pertinent test results, reviewed documented beta blocker date and time   Airway Mallampati: II  TM Distance: >3 FB Neck ROM: Full    Dental no notable dental hx. (+) Partial Lower, Partial Upper, Dental Advisory Given   Pulmonary neg pulmonary ROS,    Pulmonary exam normal breath sounds clear to auscultation       Cardiovascular hypertension, Pt. on medications and Pt. on home beta blockers  Rhythm:Regular Rate:Normal     Neuro/Psych negative neurological ROS  negative psych ROS   GI/Hepatic negative GI ROS, Neg liver ROS,   Endo/Other  diabetes, Type 2, Oral Hypoglycemic Agents  Renal/GU negative Renal ROS  negative genitourinary   Musculoskeletal  (+) Arthritis , Osteoarthritis,    Abdominal   Peds  Hematology negative hematology ROS (+)   Anesthesia Other Findings   Reproductive/Obstetrics negative OB ROS                            Anesthesia Physical Anesthesia Plan  ASA: II  Anesthesia Plan: General   Post-op Pain Management:  Regional for Post-op pain   Induction: Intravenous  Airway Management Planned: Oral ETT  Additional Equipment:   Intra-op Plan:   Post-operative Plan: Extubation in OR  Informed Consent: I have reviewed the patients History and Physical, chart, labs and discussed the procedure including the risks, benefits and alternatives for the proposed anesthesia with the patient or authorized representative who has indicated his/her understanding and acceptance.   Dental advisory given  Plan Discussed with: CRNA  Anesthesia Plan Comments:         Anesthesia Quick Evaluation

## 2016-09-28 NOTE — H&P (Signed)
Chief Complaint:  Right shoulder pain  History:  Patient with hx of right proximal humerus fracture presents to the hospital for surgical intervention.    Past Medical History:  Diagnosis Date  . Arthritis   . Diabetes mellitus   . Hypertension     No Known Allergies  No current facility-administered medications on file prior to encounter.    Current Outpatient Prescriptions on File Prior to Encounter  Medication Sig Dispense Refill  . aspirin 81 MG tablet Take 81 mg by mouth daily.    . carvedilol (COREG) 6.25 MG tablet Take 6.25 mg by mouth 2 (two) times daily with a meal.    . cetirizine (ZYRTEC) 10 MG tablet Take 10 mg by mouth daily.    . enalapril (VASOTEC) 20 MG tablet Take 20 mg by mouth daily.      Marland Kitchen HYDROcodone-acetaminophen (NORCO) 5-325 MG tablet Take 1 tablet by mouth every 6 (six) hours as needed for severe pain. 15 tablet 0  . HYDROcodone-acetaminophen (NORCO/VICODIN) 5-325 MG tablet Take 1 tablet by mouth every 6 (six) hours as needed for moderate pain. 30 tablet 0  . Linagliptin-Metformin HCl (JENTADUETO) 2.11-998 MG TABS Take 1 tablet by mouth daily.     Marland Kitchen olmesartan-hydrochlorothiazide (BENICAR HCT) 40-25 MG per tablet Take 1 tablet by mouth daily.      . sertraline (ZOLOFT) 50 MG tablet Take 50 mg by mouth daily.    . traMADol (ULTRAM) 50 MG tablet Take 1 tablet (50 mg total) by mouth every 6 (six) hours as needed. 50 tablet 0    ROS No current complaints of fever chills cardiopulmonary GI GU or neurologic issues  Physical Exam: There were no vitals filed for this visit. Pleasant elderly female in no acute distress. Head is normocephalic atraumatic. Extraocular movements intact. No respiratory distress. Cervical spine unremarkable. No abdomen distention. Right shoulder she has obvious limited range of motion due to fracture. Right shoulder is tender. She is neurovascularly intact. Skin warm and dry. Image: Dg Chest 2 View  Result Date: 09/21/2016 CLINICAL  DATA:  Status post fall out of bed, with concern for chest injury. Initial encounter. EXAM: CHEST  2 VIEW COMPARISON:  Chest radiograph from 08/07/2011 FINDINGS: The lungs are well-aerated and clear. There is no evidence of focal opacification, pleural effusion or pneumothorax. The heart is mildly enlarged. A displaced fracture of the right humeral neck is again noted, with 1/2 shaft width medial displacement of the distal humerus. Calcified granulomata are noted at the left lung apex. IMPRESSION: 1. Displaced fracture of the right humeral neck, with approximately 1/2 shaft width medial displacement of the distal humerus. 2. Mild cardiomegaly. Lungs remain grossly clear. No displaced rib fracture seen. Electronically Signed   By: Roanna Raider M.D.   On: 09/21/2016 16:44   Dg Thoracic Spine 2 View  Result Date: 09/21/2016 CLINICAL DATA:  Thoracic and lumbar pain. Larey Seat out of bed yesterday. EXAM: THORACIC SPINE 2 VIEWS COMPARISON:  Chest radiograph 08/07/2011 and lumbar spine 09/21/2016. Cervical spine CT 09/21/2016 FINDINGS: Multilevel degenerative endplate changes with bridging osteophytes throughout the thoracic spine. No evidence for vertebral body height loss or compression fracture. Aortic arch is heavily calcified. Bone detail at the cervicothoracic junction is limited. Endplate changes in the visualized cervical spine. Cervical spine better visualized on the recent CT. Again noted is a calcified granuloma near the left lung apex. IMPRESSION: Multilevel degenerative changes in thoracic spine. No evidence for an acute fracture. Aortic atherosclerosis. Electronically Signed   By:  Richarda OverlieAdam  Henn M.D.   On: 09/21/2016 16:36   Dg Lumbar Spine Complete  Result Date: 09/21/2016 CLINICAL DATA:  Status post fall out of bed, with lower back pain. Initial encounter. EXAM: LUMBAR SPINE - COMPLETE 4+ VIEW COMPARISON:  CT of the abdomen and pelvis performed 08/07/2011 FINDINGS: There is no evidence of fracture or  subluxation. There is slight chronic loss of height at the superior endplate of L2, stable from 2013. Vertebral bodies demonstrate normal alignment. Intervertebral disc spaces are preserved. The visualized neural foramina are grossly unremarkable in appearance. Scattered anterior and lateral osteophytes are noted along the lumbar spine. The visualized bowel gas pattern is unremarkable in appearance; air and stool are noted within the colon. The sacroiliac joints are within normal limits. IMPRESSION: No evidence of acute fracture or subluxation along the lumbar spine. Electronically Signed   By: Roanna RaiderJeffery  Chang M.D.   On: 09/21/2016 16:38   Dg Shoulder Right  Result Date: 09/19/2016 CLINICAL DATA:  Pt states she feel out of her bed this morning onto right shoulder/upper arm - pain concentrated proximal right humerus EXAM: RIGHT SHOULDER - 2+ VIEW COMPARISON:  None. FINDINGS: Two views of the right shoulder submitted. There is displaced subcapital fracture of the right proximal humerus. At least 1.6 cm separation of bony fragment. No shoulder dislocation. IMPRESSION: Displaced subcapital fracture of proximal right humerus. Electronically Signed   By: Natasha MeadLiviu  Pop M.D.   On: 09/19/2016 14:56   Ct Head Wo Contrast  Result Date: 09/21/2016 CLINICAL DATA:  Pain after falling out of bed.  Initial encounter. EXAM: CT HEAD WITHOUT CONTRAST CT CERVICAL SPINE WITHOUT CONTRAST TECHNIQUE: Multidetector CT imaging of the head and cervical spine was performed following the standard protocol without intravenous contrast. Multiplanar CT image reconstructions of the cervical spine were also generated. COMPARISON:  None. FINDINGS: CT HEAD FINDINGS Brain: There is no evidence of acute cortical infarct, intracranial hemorrhage, mass, midline shift, or extra-axial fluid collection. The ventricles and sulci are normal. Patchy cerebral white matter hypodensities are nonspecific but compatible with moderate chronic small vessel  ischemic disease. Vascular: Calcified atherosclerosis at the skullbase. No hyperdense vessel. Skull: No fracture or focal osseous lesion. Sinuses/Orbits: Visualized paranasal sinuses are clear. There is complete right mastoid air cell opacification. The right middle ear cavity is also nearly completely opacified. Bilateral cataract extraction. Other: None. CT CERVICAL SPINE FINDINGS Alignment: Cervical spine straightening.  No subluxation. Skull base and vertebrae: No evidence of acute fracture or destructive osseous process. Soft tissues and spinal canal: No prevertebral fluid or swelling. No visible canal hematoma. Disc levels: Diffuse anterior vertebral spurring from C3-T2. Mild multilevel disc space narrowing. Moderately large partially calcified central disc extrusion at C4-5 resulting in severe spinal stenosis. Smaller disc protrusion at C3-4. Posterior longitudinal ligament thickening in the upper cervical spine. Upper chest: Mild centrilobular emphysema in the lung apices. Left greater than right pleuroparenchymal scarring with calcification. Other: None. IMPRESSION: 1. No evidence of acute intracranial abnormality. 2. Moderate chronic small vessel ischemic disease. 3. Right otomastoiditis. 4. No evidence of acute fracture or subluxation in the cervical spine. 5. C4-5 disc extrusion with severe spinal stenosis. Electronically Signed   By: Sebastian AcheAllen  Grady M.D.   On: 09/21/2016 16:47   Ct Cervical Spine Wo Contrast  Result Date: 09/21/2016 CLINICAL DATA:  Pain after falling out of bed.  Initial encounter. EXAM: CT HEAD WITHOUT CONTRAST CT CERVICAL SPINE WITHOUT CONTRAST TECHNIQUE: Multidetector CT imaging of the head and cervical spine was performed following  the standard protocol without intravenous contrast. Multiplanar CT image reconstructions of the cervical spine were also generated. COMPARISON:  None. FINDINGS: CT HEAD FINDINGS Brain: There is no evidence of acute cortical infarct, intracranial  hemorrhage, mass, midline shift, or extra-axial fluid collection. The ventricles and sulci are normal. Patchy cerebral white matter hypodensities are nonspecific but compatible with moderate chronic small vessel ischemic disease. Vascular: Calcified atherosclerosis at the skullbase. No hyperdense vessel. Skull: No fracture or focal osseous lesion. Sinuses/Orbits: Visualized paranasal sinuses are clear. There is complete right mastoid air cell opacification. The right middle ear cavity is also nearly completely opacified. Bilateral cataract extraction. Other: None. CT CERVICAL SPINE FINDINGS Alignment: Cervical spine straightening.  No subluxation. Skull base and vertebrae: No evidence of acute fracture or destructive osseous process. Soft tissues and spinal canal: No prevertebral fluid or swelling. No visible canal hematoma. Disc levels: Diffuse anterior vertebral spurring from C3-T2. Mild multilevel disc space narrowing. Moderately large partially calcified central disc extrusion at C4-5 resulting in severe spinal stenosis. Smaller disc protrusion at C3-4. Posterior longitudinal ligament thickening in the upper cervical spine. Upper chest: Mild centrilobular emphysema in the lung apices. Left greater than right pleuroparenchymal scarring with calcification. Other: None. IMPRESSION: 1. No evidence of acute intracranial abnormality. 2. Moderate chronic small vessel ischemic disease. 3. Right otomastoiditis. 4. No evidence of acute fracture or subluxation in the cervical spine. 5. C4-5 disc extrusion with severe spinal stenosis. Electronically Signed   By: Sebastian Ache M.D.   On: 09/21/2016 16:47   Dg Humerus Right  Result Date: 09/21/2016 CLINICAL DATA:  Follow-up right humeral fracture. Subsequent encounter. EXAM: RIGHT HUMERUS - 2+ VIEW COMPARISON:  None. FINDINGS: The patient's right humeral neck fracture is again noted, with approximately 1/2 shaft width medial displacement. Mild degenerative change is noted  at the right acromioclavicular joint. No new fractures are seen. The elbow joint is incompletely assessed, but appears grossly unremarkable. IMPRESSION: Right humeral neck fracture again noted, with approximately 1/2 shaft width medial displacement. Electronically Signed   By: Roanna Raider M.D.   On: 09/21/2016 16:48   Dg Humerus Right  Result Date: 09/19/2016 CLINICAL DATA:  83 year old female with a history of fall and shoulder pain EXAM: RIGHT HUMERUS - 2+ VIEW COMPARISON:  None. FINDINGS: Acute comminuted fracture at the surgical neck of the right humerus with full shaft fracture displacement medially. Degenerative changes of the acromioclavicular joint. IMPRESSION: Acute comminuted fracture at the surgical neck of the right humerus with full shaft with displacement medially. Electronically Signed   By: Gilmer Mor D.O.   On: 09/19/2016 14:56    A/P: Right displaced proximal humerus fracture  Will proceed with ORIF as scheduled.   Surgical procedure discussed and all questions answered.

## 2016-09-28 NOTE — Interval H&P Note (Signed)
History and Physical Interval Note:  09/28/2016 9:47 AM  Kirsten GrippeEmma G Linker  has presented today for surgery, with the diagnosis of Right Proximal Humerus Fracture  The various methods of treatment have been discussed with the patient and family. After consideration of risks, benefits and other options for treatment, the patient has consented to  Procedure(s): OPEN REDUCTION INTERNAL FIXATION (ORIF) PROXIMAL HUMERUS FRACTURE (Right) as a surgical intervention .  The patient's history has been reviewed, patient examined, no change in status, stable for surgery.  I have reviewed the patient's chart and labs.  Questions were answered to the patient's satisfaction.     Eldred MangesMark C Vang Kraeger

## 2016-09-28 NOTE — Transfer of Care (Signed)
Immediate Anesthesia Transfer of Care Note  Patient: Kirsten Kelly  Procedure(s) Performed: Procedure(s): OPEN REDUCTION INTERNAL FIXATION (ORIF) PROXIMAL HUMERUS FRACTURE (Right)  Patient Location: PACU  Anesthesia Type:GA combined with regional for post-op pain  Level of Consciousness: awake and alert   Airway & Oxygen Therapy: Patient Spontanous Breathing and Patient connected to nasal cannula oxygen  Post-op Assessment: Report given to RN and Post -op Vital signs reviewed and stable  Post vital signs: Reviewed and stable  Last Vitals:  Vitals:   09/28/16 0845 09/28/16 0850  BP: (!) 198/63 (!) 172/65  Pulse: (!) 51 (!) 48  Resp: 16 13  Temp:      Last Pain:  Vitals:   09/28/16 0804  TempSrc: Oral      Patients Stated Pain Goal: 4 (09/28/16 0759)  Complications: No apparent anesthesia complications

## 2016-09-28 NOTE — Anesthesia Procedure Notes (Signed)
Procedure Name: Intubation Date/Time: 09/28/2016 10:07 AM Performed by: Valda Favia Pre-anesthesia Checklist: Patient identified, Emergency Drugs available, Suction available, Patient being monitored and Timeout performed Patient Re-evaluated:Patient Re-evaluated prior to inductionOxygen Delivery Method: Circle system utilized Preoxygenation: Pre-oxygenation with 100% oxygen Intubation Type: IV induction Ventilation: Mask ventilation without difficulty Laryngoscope Size: Mac and 4 Grade View: Grade II Tube size: 7.0 mm Number of attempts: 1 Airway Equipment and Method: Stylet Placement Confirmation: ETT inserted through vocal cords under direct vision,  positive ETCO2 and breath sounds checked- equal and bilateral Secured at: 21 cm Tube secured with: Tape Dental Injury: Teeth and Oropharynx as per pre-operative assessment

## 2016-09-28 NOTE — Progress Notes (Signed)
Orthopedic Tech Progress Note Patient Details:  Kirsten Kelly May 25, 1928 [id number]  Ortho Devices Type of Ortho Device: Arm sling   Kirsten FordyceJennifer C Senai Kelly 09/28/2016, 6:47 PM

## 2016-09-28 NOTE — Anesthesia Postprocedure Evaluation (Signed)
Anesthesia Post Note  Patient: [name]  Procedure(s) Performed: Procedure(s) (LRB): OPEN REDUCTION INTERNAL FIXATION (ORIF) PROXIMAL HUMERUS FRACTURE (Right)  Patient location during evaluation: PACU Anesthesia Type: General and Regional Level of consciousness: awake and alert Pain management: pain level controlled Vital Signs Assessment: post-procedure vital signs reviewed and stable Respiratory status: spontaneous breathing, nonlabored ventilation, respiratory function stable and patient connected to nasal cannula oxygen Cardiovascular status: blood pressure returned to baseline and stable Postop Assessment: no signs of nausea or vomiting Anesthetic complications: no       Last Vitals:  Vitals:   09/28/16 1215 09/28/16 1230  BP: (!) 144/56 128/61  Pulse: (!) 58 (!) 56  Resp: 13 11  Temp:      Last Pain:  Vitals:   09/28/16 1149  TempSrc:   PainSc: 0-No pain                 Gracielynn Birkel,W. EDMOND

## 2016-09-29 DIAGNOSIS — S42201A Unspecified fracture of upper end of right humerus, initial encounter for closed fracture: Secondary | ICD-10-CM

## 2016-09-29 LAB — GLUCOSE, CAPILLARY
GLUCOSE-CAPILLARY: 116 mg/dL — AB (ref 65–99)
GLUCOSE-CAPILLARY: 166 mg/dL — AB (ref 65–99)
GLUCOSE-CAPILLARY: 113 mg/dL — AB (ref 65–99)
Glucose-Capillary: 118 mg/dL — ABNORMAL HIGH (ref 65–99)

## 2016-09-29 NOTE — NC FL2 (Signed)
[place] MEDICAID FL2 LEVEL OF CARE SCREENING TOOL     IDENTIFICATION  Patient Name: Kirsten Kelly Birthdate: Mar 14, 1928 Sex: female Admission Date (Current Location): 09/28/2016  [facility] and [place] Number:  Producer, television/film/video and Address:  [facility], [address] [place], [place] [address]      Provider Number: [id number]  Attending Physician Name and Address:  Eldred Manges, MD  Relative Name and Phone Number:       Current Level of Care: Hospital Recommended Level of Care: Skilled Nursing Facility Prior Approval Number:    Date Approved/Denied:   PASRR Number: [id number]  Discharge Plan: SNF    Current Diagnoses: Patient Active Problem List   Diagnosis Date Noted  . Proximal humerus fracture 09/28/2016  . Diabetes mellitus 01/25/2011    Orientation RESPIRATION BLADDER Height & Weight     Self, Time, Situation, Place  O2 (Nasal Canula 2 L) Continent Weight: 176 lb (79.8 kg) Height:  5\' 6"  (167.6 cm)  BEHAVIORAL SYMPTOMS/MOOD NEUROLOGICAL BOWEL NUTRITION STATUS   (None)  (None) Continent Diet (Carb modified)  AMBULATORY STATUS COMMUNICATION OF NEEDS Skin   Extensive Assist Verbally Surgical wounds                       Personal Care Assistance Level of Assistance              Functional Limitations Info  Sight, Hearing, Speech Sight Info: Adequate Hearing Info: Adequate Speech Info: Adequate    SPECIAL CARE FACTORS FREQUENCY  PT (By licensed PT)     PT Frequency: 5 x week              Contractures Contractures Info: Not present    Additional Factors Info  Code Status, Allergies Code Status Info: Full Allergies Info: NKDA           Current Medications (09/29/2016):  This is the current hospital active medication list Current Facility-Administered Medications  Medication Dose Route Frequency Provider Last Rate Last Dose  . 0.9 %  sodium chloride infusion   Intravenous Continuous Naida Sleight,  PA-C 85 mL/hr at 09/29/16 7846    . acetaminophen (TYLENOL) tablet 650 mg  650 mg Oral Q6H PRN Naida Sleight, PA-C       Or  . acetaminophen (TYLENOL) suppository 650 mg  650 mg Rectal Q6H PRN Naida Sleight, PA-C      . aspirin EC tablet 81 mg  81 mg Oral Daily Naida Sleight, PA-C   81 mg at 09/29/16 0920  . carvedilol (COREG) tablet 6.25 mg  6.25 mg Oral BID WC Naida Sleight, PA-C   6.25 mg at 09/29/16 0856  . docusate sodium (COLACE) capsule 100 mg  100 mg Oral BID Naida Sleight, PA-C   100 mg at 09/29/16 0920  . hydrochlorothiazide (HYDRODIURIL) tablet 25 mg  25 mg Oral Daily Eldred Manges, MD   25 mg at 09/29/16 0920  . irbesartan (AVAPRO) tablet 150 mg  150 mg Oral Daily Eldred Manges, MD   150 mg at 09/29/16 0920  . lactated ringers infusion   Intravenous Continuous Gaynelle Adu, MD 10 mL/hr at 09/28/16 [phone]    . linagliptin (TRADJENTA) tablet 5 mg  5 mg Oral Daily Eldred Manges, MD   5 mg at 09/29/16 0920  . loratadine (CLARITIN) tablet 10 mg  10 mg Oral Daily Naida Sleight, PA-C   10  mg at 09/29/16 0920  . menthol-cetylpyridinium (CEPACOL) lozenge 3 mg  1 lozenge Oral PRN Naida SleightJames M Owens, PA-C       Or  . phenol (CHLORASEPTIC) mouth spray 1 spray  1 spray Mouth/Throat PRN Naida SleightJames M Owens, PA-C      . metFORMIN (GLUCOPHAGE) tablet 1,000 mg  1,000 mg Oral Q breakfast Eldred MangesMark C Yates, MD   1,000 mg at 09/29/16 0856  . metoCLOPramide (REGLAN) tablet 5-10 mg  5-10 mg Oral Q8H PRN Naida SleightJames M Owens, PA-C       Or  . metoCLOPramide (REGLAN) injection 5-10 mg  5-10 mg Intravenous Q8H PRN Naida SleightJames M Owens, PA-C      . ondansetron [facility](ZOFRAN) tablet 4 mg  4 mg Oral Q6H PRN Naida SleightJames M Owens, PA-C       Or  . ondansetron [facility](ZOFRAN) injection 4 mg  4 mg Intravenous Q6H PRN Naida SleightJames M Owens, PA-C      . senna-docusate (Senokot-S) tablet 1 tablet  1 tablet Oral QHS PRN Naida SleightJames M Owens, PA-C      . sertraline (ZOLOFT) tablet 50 mg  50 mg Oral Daily Naida SleightJames M Owens, PA-C   50 mg at 09/29/16 0920  . traMADol (ULTRAM) tablet 50  mg  50 mg Oral Q6H PRN Naida SleightJames M Owens, PA-C   50 mg at 09/29/16 0025     Discharge Medications: Please see discharge summary for a list of discharge medications.  Relevant Imaging Results:  Relevant Lab Results:   Additional Information SS#: [id number]  Margarito LinerSarah C Jamina Macbeth, LCSW

## 2016-09-29 NOTE — Progress Notes (Signed)
   Subjective: 1 Day Post-Op Procedure(s) (LRB): OPEN REDUCTION INTERNAL FIXATION (ORIF) PROXIMAL HUMERUS FRACTURE (Right) Patient reports pain as mild and moderate.    Objective: Vital signs in last 24 hours: Temp:  [97.8 F (36.6 C)-100.2 F (37.9 C)] 98.5 F (36.9 C) (03/03 0550) Pulse Rate:  [50-105] 57 (03/03 0550) Resp:  [11-16] 16 (03/03 0550) BP: (124-180)/(56-86) 156/86 (03/03 0550) SpO2:  [93 %-100 %] 100 % (03/03 0550)  Intake/Output from previous day: 03/02 0701 - 03/03 0700 In: 2640 [P.O.:480; I.V.:2160] Out: 50 [Blood:50] Intake/Output this shift: No intake/output data recorded.   Recent Labs  09/28/16 0754  HGB 13.0    Recent Labs  09/28/16 0754  WBC 9.6  RBC 4.53  HCT 39.6  PLT 214    Recent Labs  09/28/16 0754  NA 140  K 4.7  CL 101  CO2 30  BUN 26*  CREATININE 0.73  GLUCOSE 158*  CALCIUM 9.7   No results for input(s): LABPT, INR in the last 72 hours.  Neurologically intact No results found.  Assessment/Plan: 1 Day Post-Op Procedure(s) (LRB): OPEN REDUCTION INTERNAL FIXATION (ORIF) PROXIMAL HUMERUS FRACTURE (Right) Up with therapy.  She has hx of removing her sling prior to surgery. Plan SNF.   Eldred MangesMark C Alveria Mcglaughlin 09/29/2016, 12:05 PM

## 2016-09-29 NOTE — Op Note (Signed)
Operative note 09/28/2016  Preop diagnosis: Right proximal humerus fracture, displaced.  Postop diagnosis: Same  Procedure: Open reduction internal fixation right proximal humerus fracture.  Surgeon: Annell GreeningMark Yates M.D.  Assistant: Zonia KiefJames Owens PA-C medically necessary and present for the entire procedure.  Anesthesia Gen.    Procedure after induction general anesthesia beachchair position timeout procedure antibiotic prophylaxis prepping and draping was performed. Right deltopectoral incision was made. Cephalic vein was taken laterally. Narrow Bennett retractor was placed underneath the deltoid. Fracture was reduced using the draped C-arm visualization. After reduction the Biomet plate was placed and was adjusted until it was in proper position and central pin was placed up through the hole centrally in the middle of the head. There was difficulty keeping the shaft reducing it would displace with placement of just a single K wire in the distal aspect of the plate trying to pin the plate and hold it against the humeral shaft. Repeated attempts until there was satisfactory position alignment. Cortical screws were placed distally 3. All proximal screw holes had been filled with pegs checked our alignment and head rotated to make sure was in good position and no pins penetrated into the head. Copious irrigation. Final spot pictures were taken and after irrigation subtendinous tissue reapproximated by skin closure application of a dressing and sling.

## 2016-09-30 LAB — GLUCOSE, CAPILLARY
Glucose-Capillary: 100 mg/dL — ABNORMAL HIGH (ref 65–99)
Glucose-Capillary: 126 mg/dL — ABNORMAL HIGH (ref 65–99)

## 2016-09-30 NOTE — Progress Notes (Signed)
   Subjective: 2 Days Post-Op Procedure(s) (LRB): OPEN REDUCTION INTERNAL FIXATION (ORIF) PROXIMAL HUMERUS FRACTURE (Right) Patient reports pain as mild.    Objective: Vital signs in last 24 hours: Temp:  [98.9 F (37.2 C)-99.4 F (37.4 C)] 98.9 F (37.2 C) (03/04 0643) Pulse Rate:  [67-68] 67 (03/04 0643) Resp:  [16-18] 18 (03/04 0643) BP: (174-176)/(68-74) 176/68 (03/04 0643) SpO2:  [92 %] 92 % (03/04 0643)  Intake/Output from previous day: 03/03 0701 - 03/04 0700 In: 1561 [P.O.:796; I.V.:765] Out: -  Intake/Output this shift: Total I/O In: 240 [P.O.:240] Out: -    Recent Labs  09/28/16 0754  HGB 13.0    Recent Labs  09/28/16 0754  WBC 9.6  RBC 4.53  HCT 39.6  PLT 214    Recent Labs  09/28/16 0754  NA 140  K 4.7  CL 101  CO2 30  BUN 26*  CREATININE 0.73  GLUCOSE 158*  CALCIUM 9.7   No results for input(s): LABPT, INR in the last 72 hours.  Neurologically intact No results found.  Assessment/Plan: 2 Days Post-Op Procedure(s) (LRB): OPEN REDUCTION INTERNAL FIXATION (ORIF) PROXIMAL HUMERUS FRACTURE (Right) Up with therapy, fall risk.   SNF.   Kirsten Kelly 09/30/2016, 9:06 AM

## 2016-10-01 ENCOUNTER — Encounter (HOSPITAL_COMMUNITY): Payer: Self-pay | Admitting: Orthopaedic Surgery

## 2016-10-01 LAB — CBC
HCT: 38.5 % (ref 36.0–46.0)
Hemoglobin: 12.5 g/dL (ref 12.0–15.0)
MCH: 28.5 pg (ref 26.0–34.0)
MCHC: 32.5 g/dL (ref 30.0–36.0)
MCV: 87.7 fL (ref 78.0–100.0)
PLATELETS: 205 10*3/uL (ref 150–400)
RBC: 4.39 MIL/uL (ref 3.87–5.11)
RDW: 14 % (ref 11.5–15.5)
WBC: 9 10*3/uL (ref 4.0–10.5)

## 2016-10-01 LAB — COMPREHENSIVE METABOLIC PANEL
ALBUMIN: 2.7 g/dL — AB (ref 3.5–5.0)
ALK PHOS: 57 U/L (ref 38–126)
ALT: 15 U/L (ref 14–54)
AST: 22 U/L (ref 15–41)
Anion gap: 8 (ref 5–15)
BILIRUBIN TOTAL: 0.9 mg/dL (ref 0.3–1.2)
BUN: 12 mg/dL (ref 6–20)
CALCIUM: 9.3 mg/dL (ref 8.9–10.3)
CO2: 32 mmol/L (ref 22–32)
Chloride: 100 mmol/L — ABNORMAL LOW (ref 101–111)
Creatinine, Ser: 0.67 mg/dL (ref 0.44–1.00)
GFR calc Af Amer: >60 mL/min (ref >60)
GFR calc non Af Amer: >60 mL/min (ref >60)
GLUCOSE: 176 mg/dL — AB (ref 65–99)
Potassium: 3.4 mmol/L — ABNORMAL LOW (ref 3.5–5.1)
Sodium: 140 mmol/L (ref 135–145)
TOTAL PROTEIN: 6.9 g/dL (ref 6.5–8.1)

## 2016-10-01 LAB — GLUCOSE, CAPILLARY
Glucose-Capillary: 136 mg/dL — ABNORMAL HIGH (ref 65–99)
Glucose-Capillary: 120 mg/dL — ABNORMAL HIGH (ref 65–99)
Glucose-Capillary: 139 mg/dL — ABNORMAL HIGH (ref 65–99)

## 2016-10-01 MED ORDER — TRAMADOL HCL 50 MG PO TABS: 50.0000 mg | ORAL_TABLET | Freq: Four times a day (QID) | ORAL | 0 refills | Status: DC | PRN

## 2016-10-01 MED ORDER — POLYETHYLENE GLYCOL 3350 17 G PO PACK
17.0000 g | PACK | Freq: Every day | ORAL | Status: DC
  Administered 2016-10-01 – 2016-10-02 (×2): 17 g via ORAL
  Filled 2016-10-01 (×2): qty 1

## 2016-10-01 NOTE — Progress Notes (Signed)
Subjective: Doing well.  Pain controlled. Denies chest pain, sob.  No bowel movement yet.  Awaiting snf placement.    Objective: Vital signs in last 24 hours: Temp:  [98.2 F (36.8 C)-99.1 F (37.3 C)] 99.1 F (37.3 C) (03/05 0931) Pulse Rate:  [59-70] 60 (03/05 0931) Resp:  [16-18] 18 (03/05 0931) BP: (140-206)/(66-92) 140/75 (03/05 0931) SpO2:  [90 %-94 %] 94 % (03/05 0627)  Intake/Output from previous day: 03/04 0701 - 03/05 0700 In: 240 [P.O.:240] Out: -  Intake/Output this shift: No intake/output data recorded.   Recent Labs  10/01/16 0948  HGB 12.5    Recent Labs  10/01/16 0948  WBC 9.0  RBC 4.39  HCT 38.5  PLT 205    Recent Labs  10/01/16 0948  NA 140  K 3.4*  CL 100*  CO2 32  BUN 12  CREATININE 0.67  GLUCOSE 176*  CALCIUM 9.3   No results for input(s): LABPT, INR in the last 72 hours.  Exam:   Pleasant female, alert and oriented, NAD.  Wound looks good.  Staples intact.  No drainage or signs of infection.  NVI.     Assessment/Plan: Transfer to snf when bed available.     Kirsten Kelly 10/01/2016, 11:12 AM

## 2016-10-01 NOTE — Evaluation (Signed)
Physical Therapy Evaluation Patient Details Name: Kirsten Kelly MRN: [id number] DOB: Jun 30, 1928 Today's Date: 10/01/2016   History of Present Illness  83 yo female s/p R ORIF proximal humerus; pmh includes Arthritis, DM, HTN  Clinical Impression  Patient is s/p above surgery resulting in functional limitations due to the deficits listed below (see PT Problem List). Kirsten Kelly lives alone and needs further rehab to maximize independence and safety with mobility, balance, gait and ADLs prior to dc home;  Patient will benefit from skilled PT to increase their independence and safety with mobility to allow discharge to the venue listed below.       Follow Up Recommendations SNF    Equipment Recommendations  Cane    Recommendations for Other Services       Precautions / Restrictions Precautions Precautions: Fall;Shoulder Type of Shoulder Precautions: conservative Precaution Comments: shoulder handout provided with AROM digits hand wrist Restrictions RUE Weight Bearing: Non weight bearing      Mobility  Bed Mobility               General bed mobility comments: Recieved pt in recliner  Transfers Overall transfer level: Needs assistance Equipment used: Straight cane Transfers: Sit to/from Stand Sit to Stand: Mod assist         General transfer comment: Mod assist to power up; noted decr control with stand to sit once fatigued  Ambulation/Gait Ambulation/Gait assistance: Min assist;Mod assist   Assistive device: Straight cane Gait Pattern/deviations: Decreased step length - right;Decreased step length - left;Decreased stride length;Narrow base of support (toe-out bilaterally; occasional erratic step width)     General Gait Details: Min assist for safety and steadiness and cues for cane use; one instance of loss of balance requiring mod assist to prevent fall  Stairs            Wheelchair Mobility    Modified Rankin (Stroke Patients Only)        Balance                                             Pertinent Vitals/Pain Pain Assessment: 0-10 Pain Score: 5  Pain Location: R shoulder Pain Descriptors / Indicators: Sore Pain Intervention(s): Monitored during session;Ice applied    Home Living Family/patient expects to be discharged to:: Skilled nursing facility                 Additional Comments: lives in mobile home and normally uses no AE or DME    Prior Function Level of Independence: Independent               Hand Dominance   Dominant Hand: Right    Extremity/Trunk Assessment   Upper Extremity Assessment Upper Extremity Assessment: Defer to OT evaluation    Lower Extremity Assessment Lower Extremity Assessment: Generalized weakness    Cervical / Trunk Assessment Cervical / Trunk Assessment: Kyphotic  Communication   Communication: No difficulties  Cognition Arousal/Alertness: Awake/alert Behavior During Therapy: WFL for tasks assessed/performed Overall Cognitive Status: Within Functional Limits for tasks assessed (for simple mobility tasks)                      General Comments      Exercises     Assessment/Plan    PT Assessment Patient needs continued PT services  PT Problem List Decreased strength;Decreased range of motion;Decreased  activity tolerance;Decreased balance;Decreased mobility;Decreased coordination;Decreased knowledge of use of DME;Decreased safety awareness;Decreased knowledge of precautions;Pain       PT Treatment Interventions DME instruction;Gait training;Functional mobility training;Therapeutic activities;Therapeutic exercise;Balance training;Neuromuscular re-education;Cognitive remediation;Patient/family education    PT Goals (Current goals can be found in the Care Plan section)  Acute Rehab PT Goals Patient Stated Goal: to go to SNF PT Goal Formulation: With patient Time For Goal Achievement: 10/08/16 Potential to Achieve Goals:  Good    Frequency Min 3X/week   Barriers to discharge        Co-evaluation               End of Session Equipment Utilized During Treatment: Gait belt Activity Tolerance: Patient tolerated treatment well Patient left: in chair;with call bell/phone within reach;with family/visitor present Nurse Communication: Mobility status PT Visit Diagnosis: Unsteadiness on feet (R26.81);Pain Pain - Right/Left: Right Pain - part of body: Shoulder         Time: 1610-9604 PT Time Calculation (min) (ACUTE ONLY): 20 min   Charges:   PT Evaluation $PT Eval Low Complexity: 1 Procedure     PT G CodesLevi Aland 10/01/2016, 2:18 PM  Van Clines, PT  Acute Rehabilitation Services Pager [phone] Office [phone]

## 2016-10-01 NOTE — Discharge Instructions (Signed)
Orthopedic discharge instructions  -Daily dressing changes and wound checks. Staples will be removed 2 weeks postop and Steri-Strips applied.  -Shoulder immobilizer must be on at all times. Do not move arm. Absolutely no lifting or weightbearing through the right upper extremity

## 2016-10-01 NOTE — Evaluation (Signed)
Occupational Therapy Evaluation Patient Details Name: Kirsten Kelly MRN: [id number] DOB: 03-23-1928 Today's Date: 10/01/2016    History of Present Illness 83 yo female s/p R ORIF proximal humerus  Past Medical History:  Diagnosis Date  . Arthritis   . Diabetes mellitus   . Hypertension       Clinical Impression   Patient is s/p R ORIF proximal humerus surgery resulting in functional limitations due to the deficits listed below (see OT problem list). PTA was living alone independent in mobile home.  Patient will benefit from skilled OT acutely to increase independence and safety with ADLS to allow discharge SNF. Pt currently with mod (A) for LB adls and balance deficits.      Follow Up Recommendations  SNF    Equipment Recommendations  3 in 1 bedside commode    Recommendations for Other Services PT consult     Precautions / Restrictions Precautions Precautions: Fall;Shoulder Type of Shoulder Precautions: conservative Precaution Comments: shoulder handout provided with AROM digits hand wrist Restrictions RUE Weight Bearing: Non weight bearing      Mobility Bed Mobility Overal bed mobility: Needs Assistance Bed Mobility: Supine to Sit     Supine to sit: Min assist     General bed mobility comments: (A) to elevate trunk off bed surface  Transfers Overall transfer level: Needs assistance   Transfers: Sit to/from Stand Sit to Stand: Min assist              Balance                                            ADL Overall ADL's : Needs assistance/impaired Eating/Feeding: Set up;Sitting Eating/Feeding Details (indicate cue type and reason): required(A) to open due to dominant hand injury Grooming: Wash/dry hands;Min guard;Sitting Grooming Details (indicate cue type and reason): educated and used wash cloth              Lower Body Dressing: Minimal assistance   Toilet Transfer: Minimal assistance   Toileting- Clothing  Manipulation and Hygiene: Minimal assistance Toileting - Clothing Manipulation Details (indicate cue type and reason): able to sit and perform anterior peri care     Functional mobility during ADLs: Minimal assistance General ADL Comments: pt asking "where am I going?"     Vision         Perception     Praxis      Pertinent Vitals/Pain Pain Assessment: Faces Faces Pain Scale: Hurts a little bit Pain Location: R shoulder Pain Descriptors / Indicators: Sore Pain Intervention(s): Monitored during session;Premedicated before session;Repositioned;Ice applied     Hand Dominance Right   Extremity/Trunk Assessment Upper Extremity Assessment Upper Extremity Assessment: RUE deficits/detail RUE Deficits / Details: s/p surg   Lower Extremity Assessment Lower Extremity Assessment: Defer to PT evaluation   Cervical / Trunk Assessment Cervical / Trunk Assessment: Kyphotic   Communication Communication Communication: No difficulties   Cognition Arousal/Alertness: Awake/alert Behavior During Therapy: WFL for tasks assessed/performed Overall Cognitive Status: Impaired/Different from baseline Area of Impairment: Memory;Safety/judgement     Memory: Decreased short-term memory   Safety/Judgement: Decreased awareness of safety     General Comments: Pt attempting once to ambulate prior to OT in session and cued for safety. pt reaching for environmental supports for balance. Pt with poor recall of education with teach back and demonstration x2 of allowed motion at digits wrist  and elbow   General Comments  bandage dry and intact    Exercises       Shoulder Instructions      Home Living Family/patient expects to be discharged to:: Skilled nursing facility                                 Additional Comments: lives in mobile home and normally uses no AE or DME      Prior Functioning/Environment Level of Independence: Independent                 OT  Problem List: Decreased strength;Decreased activity tolerance;Impaired balance (sitting and/or standing);Decreased safety awareness;Decreased knowledge of use of DME or AE;Decreased knowledge of precautions;Pain;Decreased cognition;Decreased coordination      OT Treatment/Interventions: Self-care/ADL training;Therapeutic exercise;DME and/or AE instruction;Therapeutic activities;Balance training;Patient/family education;Cognitive remediation/compensation    OT Goals(Current goals can be found in the care plan section) Acute Rehab OT Goals Patient Stated Goal: to go to SNF OT Goal Formulation: With patient Time For Goal Achievement: 10/15/16 Potential to Achieve Goals: Good  OT Frequency: Min 2X/week   Barriers to D/C: Decreased caregiver support          Co-evaluation              End of Session Equipment Utilized During Treatment: Other (comment) (sling) Nurse Communication: Mobility status;Precautions;Weight bearing status  Activity Tolerance: Patient tolerated treatment well Patient left: in chair;with call bell/phone within reach;with chair alarm set  OT Visit Diagnosis: Unsteadiness on feet (R26.81)                ADL either performed or assessed with clinical judgement  Time: 0742-0802 OT Time Calculation (min): 20 min Charges:  OT General Charges $OT Visit: 1 Procedure OT Evaluation $OT Eval Moderate Complexity: 1 Procedure G-Codes:      Mateo FlowJones, Brynn   OTR/L Pager: [phone] Office: [phone] .   Boone MasterJones, Vernecia Umble B 10/01/2016, 8:10 AM

## 2016-10-01 NOTE — Discharge Summary (Signed)
Patient ID: Kirsten Kelly MRN: [name] DOB/AGE: 04-15-1928 83 y.o.  Admit date: 09/28/2016 Discharge date: 10/01/2016  Admission Diagnoses:  Active Problems:   Proximal humerus fracture   Closed fracture of right proximal humerus   Discharge Diagnoses:  Active Problems:   Proximal humerus fracture   Closed fracture of right proximal humerus  status post Procedure(s): OPEN REDUCTION INTERNAL FIXATION (ORIF) PROXIMAL HUMERUS FRACTURE  Past Medical History:  Diagnosis Date  . Arthritis   . Diabetes mellitus   . Hypertension     Surgeries: Procedure(s): OPEN REDUCTION INTERNAL FIXATION (ORIF) PROXIMAL HUMERUS FRACTURE on 09/28/2016   Consultants:   Discharged Condition: Improved  Hospital Course: Kirsten Kelly is an 83 y.o. female who was admitted 09/28/2016 for operative treatment of Right proximal humerus fracture. Patient failed conservative treatments (please see the history and physical for the specifics) and had severe unremitting pain that affects sleep, daily activities and work/hobbies. After pre-op clearance, the patient was taken to the operating room on 09/28/2016 and underwent  Procedure(s): OPEN REDUCTION INTERNAL FIXATION (ORIF) PROXIMAL HUMERUS FRACTURE.    Patient was given perioperative antibiotics:  Anti-infectives    Start     Dose/Rate Route Frequency Ordered Stop   09/28/16 0730  ceFAZolin (ANCEF) IVPB 2g/100 mL premix     2 g 200 mL/hr over 30 Minutes Intravenous On call to O.R. 09/28/16 0723 09/28/16 1012   09/28/16 0727  ceFAZolin (ANCEF) 2-4 GM/100ML-% IVPB    Comments:  Kirsten FailJones, Kirsten   : cabinet override      09/28/16 0727 09/28/16 1012       Patient was given sequential compression devices and early ambulation to prevent DVT.   Patient benefited maximally from hospital stay and there were no complications. At the time of discharge, the patient was urinating/moving their bowels without difficulty, tolerating a regular diet, pain is  controlled with oral pain medications and they have been cleared by PT/OT.   Recent vital signs:  Patient Vitals for the past 24 hrs:  BP Temp Temp src Pulse Resp SpO2  10/01/16 0931 140/75 99.1 F (37.3 C) Oral 60 18 -  10/01/16 0627 (!) 204/92 98.2 F (36.8 C) Oral 70 16 94 %  10/01/16 0002 (!) 167/86 - - - - -  09/30/16 2307 (!) 206/79 98.6 F (37 C) Oral 61 18 93 %  09/30/16 1511 (!) 143/75 98.3 F (36.8 C) - (!) 59 17 93 %     Recent laboratory studies:   Recent Labs  10/01/16 0948  WBC 9.0  HGB 12.5  HCT 38.5  PLT 205  NA 140  K 3.4*  CL 100*  CO2 32  BUN 12  CREATININE 0.67  GLUCOSE 176*  CALCIUM 9.3     Discharge Medications:   Allergies as of 10/01/2016   No Known Allergies     Medication List    STOP taking these medications   HYDROcodone-acetaminophen 5-325 MG tablet Commonly known as:  NORCO     TAKE these medications   aspirin 81 MG tablet Take 81 mg by mouth daily.   carvedilol 6.25 MG tablet Commonly known as:  COREG Take 6.25 mg by mouth 2 (two) times daily with a meal.   cetirizine 10 MG tablet Commonly known as:  ZYRTEC Take 10 mg by mouth daily.   enalapril 20 MG tablet Commonly known as:  VASOTEC Take 20 mg by mouth daily.   JENTADUETO 2.11-998 MG Tabs Generic drug:  Linagliptin-Metformin HCl Take  1 tablet by mouth daily.   olmesartan-hydrochlorothiazide 40-25 MG tablet Commonly known as:  BENICAR HCT Take 1 tablet by mouth daily.   sertraline 50 MG tablet Commonly known as:  ZOLOFT Take 50 mg by mouth daily.   traMADol 50 MG tablet Commonly known as:  ULTRAM Take 1 tablet (50 mg total) by mouth every 6 (six) hours as needed.       Diagnostic Studies: Dg Chest 2 View  Result Date: 09/21/2016 CLINICAL DATA:  Status post fall out of bed, with concern for chest injury. Initial encounter. EXAM: CHEST  2 VIEW COMPARISON:  Chest radiograph from 08/07/2011 FINDINGS: The lungs are well-aerated and clear. There is no  evidence of focal opacification, pleural effusion or pneumothorax. The heart is mildly enlarged. A displaced fracture of the right humeral neck is again noted, with 1/2 shaft width medial displacement of the distal humerus. Calcified granulomata are noted at the left lung apex. IMPRESSION: 1. Displaced fracture of the right humeral neck, with approximately 1/2 shaft width medial displacement of the distal humerus. 2. Mild cardiomegaly. Lungs remain grossly clear. No displaced rib fracture seen. Electronically Signed   By: Roanna Raider M.D.   On: 09/21/2016 16:44   Dg Thoracic Spine 2 View  Result Date: 09/21/2016 CLINICAL DATA:  Thoracic and lumbar pain. Larey Seat out of bed yesterday. EXAM: THORACIC SPINE 2 VIEWS COMPARISON:  Chest radiograph 08/07/2011 and lumbar spine 09/21/2016. Cervical spine CT 09/21/2016 FINDINGS: Multilevel degenerative endplate changes with bridging osteophytes throughout the thoracic spine. No evidence for vertebral body height loss or compression fracture. Aortic arch is heavily calcified. Bone detail at the cervicothoracic junction is limited. Endplate changes in the visualized cervical spine. Cervical spine better visualized on the recent CT. Again noted is a calcified granuloma near the left lung apex. IMPRESSION: Multilevel degenerative changes in thoracic spine. No evidence for an acute fracture. Aortic atherosclerosis. Electronically Signed   By: Richarda Overlie M.D.   On: 09/21/2016 16:36   Dg Lumbar Spine Complete  Result Date: 09/21/2016 CLINICAL DATA:  Status post fall out of bed, with lower back pain. Initial encounter. EXAM: LUMBAR SPINE - COMPLETE 4+ VIEW COMPARISON:  CT of the abdomen and pelvis performed 08/07/2011 FINDINGS: There is no evidence of fracture or subluxation. There is slight chronic loss of height at the superior endplate of L2, stable from 2013. Vertebral bodies demonstrate normal alignment. Intervertebral disc spaces are preserved. The visualized neural  foramina are grossly unremarkable in appearance. Scattered anterior and lateral osteophytes are noted along the lumbar spine. The visualized bowel gas pattern is unremarkable in appearance; air and stool are noted within the colon. The sacroiliac joints are within normal limits. IMPRESSION: No evidence of acute fracture or subluxation along the lumbar spine. Electronically Signed   By: Roanna Raider M.D.   On: 09/21/2016 16:38   Dg Shoulder Right  Result Date: 09/28/2016 CLINICAL DATA:  ORIF. EXAM: DG C-ARM 61-120 MIN; RIGHT SHOULDER - 2+ VIEW COMPARISON:  No recent prior . FINDINGS: Plate and screw fixation of displaced right humeral fracture. Hardware intact. Prominent degenerative changes right shoulder. IMPRESSION: ORIF proximal right humerus. Electronically Signed   By: Maisie Fus  Register   On: 09/28/2016 11:43   Dg Shoulder Right  Result Date: 09/19/2016 CLINICAL DATA:  Pt states she feel out of her bed this morning onto right shoulder/upper arm - pain concentrated proximal right humerus EXAM: RIGHT SHOULDER - 2+ VIEW COMPARISON:  None. FINDINGS: Two views of the right shoulder submitted.  There is displaced subcapital fracture of the right proximal humerus. At least 1.6 cm separation of bony fragment. No shoulder dislocation. IMPRESSION: Displaced subcapital fracture of proximal right humerus. Electronically Signed   By: Natasha Mead M.D.   On: 09/19/2016 14:56   Ct Head Wo Contrast  Result Date: 09/21/2016 CLINICAL DATA:  Pain after falling out of bed.  Initial encounter. EXAM: CT HEAD WITHOUT CONTRAST CT CERVICAL SPINE WITHOUT CONTRAST TECHNIQUE: Multidetector CT imaging of the head and cervical spine was performed following the standard protocol without intravenous contrast. Multiplanar CT image reconstructions of the cervical spine were also generated. COMPARISON:  None. FINDINGS: CT HEAD FINDINGS Brain: There is no evidence of acute cortical infarct, intracranial hemorrhage, mass, midline shift,  or extra-axial fluid collection. The ventricles and sulci are normal. Patchy cerebral white matter hypodensities are nonspecific but compatible with moderate chronic small vessel ischemic disease. Vascular: Calcified atherosclerosis at the skullbase. No hyperdense vessel. Skull: No fracture or focal osseous lesion. Sinuses/Orbits: Visualized paranasal sinuses are clear. There is complete right mastoid air cell opacification. The right middle ear cavity is also nearly completely opacified. Bilateral cataract extraction. Other: None. CT CERVICAL SPINE FINDINGS Alignment: Cervical spine straightening.  No subluxation. Skull base and vertebrae: No evidence of acute fracture or destructive osseous process. Soft tissues and spinal canal: No prevertebral fluid or swelling. No visible canal hematoma. Disc levels: Diffuse anterior vertebral spurring from C3-T2. Mild multilevel disc space narrowing. Moderately large partially calcified central disc extrusion at C4-5 resulting in severe spinal stenosis. Smaller disc protrusion at C3-4. Posterior longitudinal ligament thickening in the upper cervical spine. Upper chest: Mild centrilobular emphysema in the lung apices. Left greater than right pleuroparenchymal scarring with calcification. Other: None. IMPRESSION: 1. No evidence of acute intracranial abnormality. 2. Moderate chronic small vessel ischemic disease. 3. Right otomastoiditis. 4. No evidence of acute fracture or subluxation in the cervical spine. 5. C4-5 disc extrusion with severe spinal stenosis. Electronically Signed   By: Sebastian Ache M.D.   On: 09/21/2016 16:47   Ct Cervical Spine Wo Contrast  Result Date: 09/21/2016 CLINICAL DATA:  Pain after falling out of bed.  Initial encounter. EXAM: CT HEAD WITHOUT CONTRAST CT CERVICAL SPINE WITHOUT CONTRAST TECHNIQUE: Multidetector CT imaging of the head and cervical spine was performed following the standard protocol without intravenous contrast. Multiplanar CT image  reconstructions of the cervical spine were also generated. COMPARISON:  None. FINDINGS: CT HEAD FINDINGS Brain: There is no evidence of acute cortical infarct, intracranial hemorrhage, mass, midline shift, or extra-axial fluid collection. The ventricles and sulci are normal. Patchy cerebral white matter hypodensities are nonspecific but compatible with moderate chronic small vessel ischemic disease. Vascular: Calcified atherosclerosis at the skullbase. No hyperdense vessel. Skull: No fracture or focal osseous lesion. Sinuses/Orbits: Visualized paranasal sinuses are clear. There is complete right mastoid air cell opacification. The right middle ear cavity is also nearly completely opacified. Bilateral cataract extraction. Other: None. CT CERVICAL SPINE FINDINGS Alignment: Cervical spine straightening.  No subluxation. Skull base and vertebrae: No evidence of acute fracture or destructive osseous process. Soft tissues and spinal canal: No prevertebral fluid or swelling. No visible canal hematoma. Disc levels: Diffuse anterior vertebral spurring from C3-T2. Mild multilevel disc space narrowing. Moderately large partially calcified central disc extrusion at C4-5 resulting in severe spinal stenosis. Smaller disc protrusion at C3-4. Posterior longitudinal ligament thickening in the upper cervical spine. Upper chest: Mild centrilobular emphysema in the lung apices. Left greater than right pleuroparenchymal scarring with calcification.  Other: None. IMPRESSION: 1. No evidence of acute intracranial abnormality. 2. Moderate chronic small vessel ischemic disease. 3. Right otomastoiditis. 4. No evidence of acute fracture or subluxation in the cervical spine. 5. C4-5 disc extrusion with severe spinal stenosis. Electronically Signed   By: Sebastian Ache M.D.   On: 09/21/2016 16:47   Dg Humerus Right  Result Date: 09/21/2016 CLINICAL DATA:  Follow-up right humeral fracture. Subsequent encounter. EXAM: RIGHT HUMERUS - 2+ VIEW  COMPARISON:  None. FINDINGS: The patient's right humeral neck fracture is again noted, with approximately 1/2 shaft width medial displacement. Mild degenerative change is noted at the right acromioclavicular joint. No new fractures are seen. The elbow joint is incompletely assessed, but appears grossly unremarkable. IMPRESSION: Right humeral neck fracture again noted, with approximately 1/2 shaft width medial displacement. Electronically Signed   By: Roanna Raider M.D.   On: 09/21/2016 16:48   Dg Humerus Right  Result Date: 09/19/2016 CLINICAL DATA:  83 year old female with a history of fall and shoulder pain EXAM: RIGHT HUMERUS - 2+ VIEW COMPARISON:  None. FINDINGS: Acute comminuted fracture at the surgical neck of the right humerus with full shaft fracture displacement medially. Degenerative changes of the acromioclavicular joint. IMPRESSION: Acute comminuted fracture at the surgical neck of the right humerus with full shaft with displacement medially. Electronically Signed   By: Gilmer Mor D.O.   On: 09/19/2016 14:56   Dg C-arm 1-60 Min  Result Date: 09/28/2016 CLINICAL DATA:  ORIF. EXAM: DG C-ARM 61-120 MIN; RIGHT SHOULDER - 2+ VIEW COMPARISON:  No recent prior . FINDINGS: Plate and screw fixation of displaced right humeral fracture. Hardware intact. Prominent degenerative changes right shoulder. IMPRESSION: ORIF proximal right humerus. Electronically Signed   By: Maisie Fus  Register   On: 09/28/2016 11:43      Follow-up Information    Eldred Manges, MD. Schedule an appointment as soon as possible for a visit in 2 week(s).   Specialty:  Orthopedic Surgery Contact information: [address] [place] [address] [phone]           Discharge Plan:  discharge to Skilled facility  Disposition:     Signed: Zonia Kief for Dr. Annell Greening 10/01/2016, 11:44 AM

## 2016-10-02 DIAGNOSIS — Z8659 Personal history of other mental and behavioral disorders: Secondary | ICD-10-CM | POA: Diagnosis not present

## 2016-10-02 DIAGNOSIS — S42211A Unspecified displaced fracture of surgical neck of right humerus, initial encounter for closed fracture: Secondary | ICD-10-CM | POA: Diagnosis not present

## 2016-10-02 DIAGNOSIS — S42201D Unspecified fracture of upper end of right humerus, subsequent encounter for fracture with routine healing: Secondary | ICD-10-CM | POA: Diagnosis not present

## 2016-10-02 DIAGNOSIS — E119 Type 2 diabetes mellitus without complications: Secondary | ICD-10-CM

## 2016-10-02 DIAGNOSIS — R05 Cough: Secondary | ICD-10-CM | POA: Diagnosis not present

## 2016-10-02 DIAGNOSIS — Z9181 History of falling: Secondary | ICD-10-CM | POA: Diagnosis not present

## 2016-10-02 DIAGNOSIS — S42309A Unspecified fracture of shaft of humerus, unspecified arm, initial encounter for closed fracture: Secondary | ICD-10-CM | POA: Diagnosis not present

## 2016-10-02 DIAGNOSIS — S42201A Unspecified fracture of upper end of right humerus, initial encounter for closed fracture: Secondary | ICD-10-CM | POA: Diagnosis not present

## 2016-10-02 DIAGNOSIS — R488 Other symbolic dysfunctions: Secondary | ICD-10-CM | POA: Diagnosis not present

## 2016-10-02 DIAGNOSIS — R531 Weakness: Secondary | ICD-10-CM | POA: Diagnosis not present

## 2016-10-02 DIAGNOSIS — I10 Essential (primary) hypertension: Secondary | ICD-10-CM

## 2016-10-02 DIAGNOSIS — M6281 Muscle weakness (generalized): Secondary | ICD-10-CM | POA: Diagnosis not present

## 2016-10-02 DIAGNOSIS — R0989 Other specified symptoms and signs involving the circulatory and respiratory systems: Secondary | ICD-10-CM | POA: Diagnosis not present

## 2016-10-02 DIAGNOSIS — Z79899 Other long term (current) drug therapy: Secondary | ICD-10-CM | POA: Diagnosis not present

## 2016-10-02 DIAGNOSIS — F329 Major depressive disorder, single episode, unspecified: Secondary | ICD-10-CM

## 2016-10-02 DIAGNOSIS — M1990 Unspecified osteoarthritis, unspecified site: Secondary | ICD-10-CM | POA: Diagnosis not present

## 2016-10-02 DIAGNOSIS — R7611 Nonspecific reaction to tuberculin skin test without active tuberculosis: Secondary | ICD-10-CM | POA: Diagnosis not present

## 2016-10-02 DIAGNOSIS — S42294D Other nondisplaced fracture of upper end of right humerus, subsequent encounter for fracture with routine healing: Secondary | ICD-10-CM | POA: Diagnosis not present

## 2016-10-02 DIAGNOSIS — F32A Depression, unspecified: Secondary | ICD-10-CM

## 2016-10-02 DIAGNOSIS — S42291D Other displaced fracture of upper end of right humerus, subsequent encounter for fracture with routine healing: Secondary | ICD-10-CM | POA: Diagnosis not present

## 2016-10-02 DIAGNOSIS — Z7982 Long term (current) use of aspirin: Secondary | ICD-10-CM | POA: Diagnosis not present

## 2016-10-02 LAB — CBC
HCT: 38.3 % (ref 36.0–46.0)
HEMOGLOBIN: 12.4 g/dL (ref 12.0–15.0)
MCH: 28.6 pg (ref 26.0–34.0)
MCHC: 32.4 g/dL (ref 30.0–36.0)
MCV: 88.5 fL (ref 78.0–100.0)
Platelets: 214 10*3/uL (ref 150–400)
RBC: 4.33 MIL/uL (ref 3.87–5.11)
RDW: 14.4 % (ref 11.5–15.5)
WBC: 9.5 10*3/uL (ref 4.0–10.5)

## 2016-10-02 LAB — BASIC METABOLIC PANEL
Anion gap: 8 (ref 5–15)
BUN: 11 mg/dL (ref 6–20)
CHLORIDE: 101 mmol/L (ref 101–111)
CO2: 31 mmol/L (ref 22–32)
Calcium: 9.5 mg/dL (ref 8.9–10.3)
Creatinine, Ser: 0.59 mg/dL (ref 0.44–1.00)
Glucose, Bld: 160 mg/dL — ABNORMAL HIGH (ref 65–99)
POTASSIUM: 3.3 mmol/L — AB (ref 3.5–5.1)
SODIUM: 140 mmol/L (ref 135–145)

## 2016-10-02 LAB — GLUCOSE, CAPILLARY
GLUCOSE-CAPILLARY: 101 mg/dL — AB (ref 65–99)
Glucose-Capillary: 118 mg/dL — ABNORMAL HIGH (ref 65–99)

## 2016-10-02 MED ORDER — HYDRALAZINE HCL 20 MG/ML IJ SOLN
5.0000 mg | INTRAMUSCULAR | Status: DC | PRN
  Administered 2016-10-02 (×2): 10 mg via INTRAVENOUS
  Filled 2016-10-02 (×2): qty 1

## 2016-10-02 NOTE — Care Management Important Message (Signed)
Important Message  Patient Details  Name: Kirsten Kelly MRN: [id number] Date of Birth: 1928/07/12   Medicare Important Message Given:  Yes    Dorena BodoIris Annelies Coyt 10/02/2016, 11:25 AM

## 2016-10-02 NOTE — Consult Note (Addendum)
Medical Consultation   Kirsten Kelly  ZOX:[id number]  DOB: 17-Nov-1927  DOA: 09/28/2016  PCP: Cala Bradford, MD   Outpatient Specialists: Orthopedics, Podiatry   Requesting physician: Dr Ophelia Charter  Reason for consultation: HTN and general medical management   History of Present Illness: Kirsten Kelly is an 83 y.o. female with a past medical history of arthritis, diabetes and hypertension who presented to [facility] on 09/28/2016 for elective right humeral ORIF due to proximal humeral fracture sustained after mechanical fall on 09/19/2016. Patient underwent right humeral ORIF on 09/29/2016 successfully. Since time of procedure patient has worked closely with primary surgical team and occupational and physical therapy with planned discharge in the near future to SNF. Patient currently denies chest pain, palpitations, nausea, vomiting, diarrhea, abdominal pain, fevers, shortness of breath, neck stiffness, headache, vertigo, focal neurological deficits.   Review of Systems:  ROS As per HPI otherwise 10 point review of systems negative.    Past Medical History: Past Medical History:  Diagnosis Date  . Arthritis   . Diabetes mellitus   . Hypertension     Past Surgical History: Past Surgical History:  Procedure Laterality Date  . ABDOMINAL HYSTERECTOMY    . ORIF HUMERUS FRACTURE Right 09/28/2016   Procedure: OPEN REDUCTION INTERNAL FIXATION (ORIF) PROXIMAL HUMERUS FRACTURE;  Surgeon: Eldred Manges, MD;  Location: [facility] OR;  Service: Orthopedics;  Laterality: Right;     Allergies:  No Known Allergies   Social History:  reports that she has never smoked. She has never used smokeless tobacco. She reports that she drinks alcohol. She reports that she does not use drugs.   Family History: History reviewed. No pertinent family history.   Physical Exam: Vitals:   10/01/16 2003 10/02/16 0020 10/02/16 0411 10/02/16 0855  BP: (!) 145/81 (!) 165/72 (!) 193/73  (!) 182/84  Pulse: 63 65 (!) 57 62  Resp: 18 18 18 16   Temp: 99.1 F (37.3 C) 99.1 F (37.3 C) 99 F (37.2 C) 98.6 F (37 C)  TempSrc: Oral Oral Oral Oral  SpO2: 95% 96% 99% 97%  Weight:      Height:        General:  Appears calm and comfortable Eyes:  PERRL, EOMI, normal lids, iris ENT: grossly normal hearing, lips & tongue, mmm Neck:  no LAD, masses or thyromegaly Cardiovascular:  RRR, no m/r/g. No LE edema.  Respiratory:  CTA bilaterally, no w/r/r. Normal respiratory effort. Abdomen:  soft, ntnd, NABS Skin:  no rash or induration seen on limited exam Musculoskeletal: R arm in sling w/ surgical dressings in place. No bony abnormalities. All other extremities w/ FROM.  Psychiatric:  grossly normal mood and affect, speech fluent and appropriate, AOx3 Neurologic:  CN 2-12 grossly intact, moves all extremities in coordinated fashion, sensation intact  Data reviewed:  I have personally reviewed following labs and imaging studies Labs:  CBC:  Recent Labs Lab 09/28/16 0754 10/01/16 0948 10/02/16 0914  WBC 9.6 9.0 9.5  HGB 13.0 12.5 12.4  HCT 39.6 38.5 38.3  MCV 87.4 87.7 88.5  PLT 214 205 214    Basic Metabolic Panel:  Recent Labs Lab 09/28/16 0754 10/01/16 0948 10/02/16 0914  NA 140 140 140  K 4.7 3.4* 3.3*  CL 101 100* 101  CO2 30 32 31  GLUCOSE 158* 176* 160*  BUN 26* 12 11  CREATININE 0.73 0.67 0.59  CALCIUM 9.7 9.3  9.5   GFR Estimated Creatinine Clearance: 53.8 mL/min (by C-G formula based on SCr of 0.59 mg/dL). Liver Function Tests:  Recent Labs Lab 09/28/16 0754 10/01/16 0948  AST 35 22  ALT 21 15  ALKPHOS 56 57  BILITOT 2.1* 0.9  PROT 7.4 6.9  ALBUMIN 3.3* 2.7*   No results for input(s): LIPASE, AMYLASE in the last 168 hours. No results for input(s): AMMONIA in the last 168 hours. Coagulation profile No results for input(s): INR, PROTIME in the last 168 hours.  Cardiac Enzymes: No results for input(s): CKTOTAL, CKMB, CKMBINDEX,  TROPONINI in the last 168 hours. BNP: Invalid input(s): POCBNP CBG:  Recent Labs Lab 09/30/16 2309 10/01/16 0632 10/01/16 1147 10/01/16 2130 10/02/16 0640  GLUCAP 126* 136* 120* 139* 118*   D-Dimer No results for input(s): DDIMER in the last 72 hours. Hgb A1c No results for input(s): HGBA1C in the last 72 hours. Lipid Profile No results for input(s): CHOL, HDL, LDLCALC, TRIG, CHOLHDL, LDLDIRECT in the last 72 hours. Thyroid function studies No results for input(s): TSH, T4TOTAL, T3FREE, THYROIDAB in the last 72 hours.  Invalid input(s): FREET3 Anemia work up No results for input(s): VITAMINB12, FOLATE, FERRITIN, TIBC, IRON, RETICCTPCT in the last 72 hours. Urinalysis    Component Value Date/Time   COLORURINE YELLOW 09/21/2016 1431   APPEARANCEUR CLEAR 09/21/2016 1431   LABSPEC 1.020 09/21/2016 1431   PHURINE 6.0 09/21/2016 1431   GLUCOSEU NEGATIVE 09/21/2016 1431   HGBUR NEGATIVE 09/21/2016 1431   BILIRUBINUR NEGATIVE 09/21/2016 1431   KETONESUR 5 (A) 09/21/2016 1431   PROTEINUR 100 (A) 09/21/2016 1431   NITRITE NEGATIVE 09/21/2016 1431   LEUKOCYTESUR NEGATIVE 09/21/2016 1431     Microbiology No results found for this or any previous visit (from the past 240 hour(s)).     Inpatient Medications:   Scheduled Meds: . aspirin EC  81 mg Oral Daily  . carvedilol  6.25 mg Oral BID WC  . docusate sodium  100 mg Oral BID  . hydrochlorothiazide  25 mg Oral Daily  . irbesartan  150 mg Oral Daily  . linagliptin  5 mg Oral Daily  . loratadine  10 mg Oral Daily  . metFORMIN  1,000 mg Oral Q breakfast  . polyethylene glycol  17 g Oral Daily  . sertraline  50 mg Oral Daily   Continuous Infusions: . sodium chloride Stopped (09/29/16 1602)  . lactated ringers Stopped (10/01/16 0846)     Radiological Exams on Admission: No results found.  Impression/Recommendations Active Problems:   Diabetes mellitus (HCC)   Closed fracture of right proximal humerus    Depression   Essential hypertension   Proximal Humeral Fracture: s/p ORIF on 09/29/16. POD #3. - Mgt per primary team  HTN: Pt unsure of which medications she was on prior to admission and states she used to be on other medications for BP control but again can't remember which ones.  - continue coreg, HCTZ, avapro - Add hydralazine IV prn - further mgt in outpt setting  DM: good control while in hospital. Renal function preserved - continue home tradjenta and metformin.  - Add SSI if glucose becomes elevated above 150 consistently.   Depression: at baseline.  - continue zoloft  Thank you for this consultation.  [facility] hospital[facility] team will follow the patient with you.   Sony Schlarb J M.D. [facility] 10/02/2016, 10:52 AM

## 2016-10-02 NOTE — Clinical Social Work Note (Deleted)
Clinical Social Worker facilitated patient discharge including contacting patient family and facility to confirm patient discharge plans.  Clinical information faxed to facility and family agreeable with plan.  CSW [facility].  RN to call [phone] for report prior to discharge. Patient will go to room 211.  Clinical Social Worker will sign off for now as social work intervention is no longer needed. Please consult us again if new need arises.  [address]Kirsten Kelly, [place] [phone]

## 2016-10-02 NOTE — Clinical Social Work Placement (Signed)
   CLINICAL SOCIAL WORK PLACEMENT  NOTE  Date:  10/02/2016  Patient Details  Name: Kirsten Kelly Werk MRN: [id number] Date of Birth: 12-08-1927  Clinical Social Work is seeking post-discharge placement for this patient at the Skilled  Nursing Facility level of care (*CSW will initial, date and re-position this form in  chart as items are completed):      Patient/family provided with [facility] Clinical [facility] Work Department's list of facilities offering this level of care within the geographic area requested by the patient (or if unable, by the patient's family).      Patient/family informed of their freedom to choose among providers that offer the needed level of care, that participate in Medicare, Medicaid or managed care program needed by the patient, have an available bed and are willing to accept the patient.      Patient/family informed of [facility] ownership interest in [facility] and [facility], as well as of the fact that they are under no obligation to receive care at these facilities.  PASRR submitted to EDS on       PASRR number received on 10/02/16     Existing PASRR number confirmed on       FL2 transmitted to all facilities in geographic area requested by pt/family on 10/02/16     FL2 transmitted to all facilities within larger geographic area on       Patient informed that his/her managed care company has contracts with or will negotiate with certain facilities, including the following:        Yes   Patient/family informed of bed offers received.  [facility]     Physician recommends and patient chooses bed at      [facility] on 10/02/16.  Patient to be transferred to facility by [facility]     Patient family notified on 10/02/16 of transfer.  Name of family member notified:        PHYSICIAN Please prepare priority discharge summary, including medications, Please  prepare prescriptions     Additional Comment:    _______________________________________________ Volney AmericanBridget A Mayton, LCSW 10/02/2016, 10:13 AM

## 2016-10-02 NOTE — Progress Notes (Signed)
Occupational Therapy Treatment Patient Details Name: Kirsten Kelly MRN: [id number] DOB: May 19, 1928 Today's Date: 10/02/2016    History of present illness 83 yo female s/p R ORIF proximal humerus; pmh includes Arthritis, DM, HTN   OT comments  Pt fatigued and resting upon exit of the room with repositioning. Pt currently declined OOB due to fatigue. Pt educated on proper bed alignment and positioning R UE. Ice pack applied.    Follow Up Recommendations  SNF    Equipment Recommendations  3 in 1 bedside commode    Recommendations for Other Services PT consult    Precautions / Restrictions Precautions Precautions: Fall;Shoulder Type of Shoulder Precautions: conservative Restrictions RUE Weight Bearing: Non weight bearing       Mobility Bed Mobility         Supine to sit: Min assist        Transfers                 General transfer comment: declined    Balance                                   ADL                                         General ADL Comments: Pt repositioned in the bed for proper R shoulder positioning. sling adjusted and ice pack applied. Pt falling asleep once positioned. Pt declined OOB on arrival. Session focused on R shoulder proper alignment in sling      Vision                     Perception     Praxis      Cognition   Behavior During Therapy: [facility] for tasks assessed/performed Overall Cognitive Status: Within Functional Limits for tasks assessed                         Exercises Other Exercises Other Exercises: AROM hand wrist and elbow   Shoulder Instructions       General Comments      Pertinent Vitals/ Pain       Pain Assessment: Faces Faces Pain Scale: No hurt  Home Living                                          Prior Functioning/Environment              Frequency  Min 2X/week        Progress Toward Goals  OT Goals(current  goals can now be found in the care plan section)  Progress towards OT goals: Not progressing toward goals - comment  Acute Rehab OT Goals Patient Stated Goal: to go to SNF OT Goal Formulation: With patient Time For Goal Achievement: 10/15/16 Potential to Achieve Goals: Good ADL Goals Pt Will Perform Grooming: with min guard assist;standing Pt Will Perform Lower Body Dressing: with min guard assist;sit to/from stand Pt Will Transfer to Toilet: with min guard assist;bedside commode Additional ADL Goal #1: Pt will don doff shoulder sling mod I as precursor to adls.   Plan Discharge plan remains appropriate    Co-evaluation  End of Session    OT Visit Diagnosis: Unsteadiness on feet (R26.81)   Activity Tolerance Patient limited by lethargy   Patient Left in bed;with call bell/phone within reach;with bed alarm set   Nurse Communication Mobility status;Precautions;Weight bearing status        Time: 1610-9604 OT Time Calculation (min): 8 min  Charges: OT General Charges $OT Visit: 1 Procedure OT Treatments $Self Care/Home Management : 8-22 mins   Mateo Flow   OTR/L Pager: [phone] Office: [phone] .    Boone Master B 10/02/2016, 10:43 AM

## 2016-10-02 NOTE — Clinical Social Work Note (Signed)
RN notified CSW that pt will have some test done before d/c. [facility]. CSW following for d/c.   [address]Kirsten Kelly, [place] [phone]

## 2016-10-02 NOTE — Progress Notes (Signed)
Subjective: Patient states that she is doing well. Pain controlled. Denies chest pain shortness of breath.   Objective: Vital signs in last 24 hours: Temp:  [98.6 F (37 C)-99.1 F (37.3 C)] 98.6 F (37 C) (03/06 0855) Pulse Rate:  [57-65] 62 (03/06 0855) Resp:  [16-18] 16 (03/06 0855) BP: (145-193)/(72-84) 182/84 (03/06 0855) SpO2:  [95 %-99 %] 97 % (03/06 0855)  Intake/Output from previous day: 03/05 0701 - 03/06 0700 In: 240 [P.O.:240] Out: -  Intake/Output this shift: No intake/output data recorded.   Recent Labs  10/01/16 0948 10/02/16 0914  HGB 12.5 12.4    Recent Labs  10/01/16 0948 10/02/16 0914  WBC 9.0 9.5  RBC 4.39 4.33  HCT 38.5 38.3  PLT 205 214    Recent Labs  10/01/16 0948  NA 140  K 3.4*  CL 100*  CO2 32  BUN 12  CREATININE 0.67  GLUCOSE 176*  CALCIUM 9.3   No results for input(s): LABPT, INR in the last 72 hours.  Exam: Extremely pleasant elderly white female alert and oriented and in no acute distress. Right shoulder wound looks good. Staples intact. No drainage or signs of infection. Neurovascularly intact. No focal motor deficits. Bilateral calves are nontender.   Assessment/Plan: -Status post ORIF right proximal humerus fracture doing well. Continue right shoulder mobilizer at all times. Awaiting skilled nursing facility placement.  -Hypertension.  I spoke with Dr. Kirby Funkavid Merrill with the hospitalist service and asked if he would do a consult to help get her blood pressure under better control before she transfers to skilled nursing facility. I ordered CBC, bmet and EKG today.   Zonia KiefJames Amerika Nourse 10/02/2016, 9:56 AM

## 2016-10-02 NOTE — Progress Notes (Signed)
Pt's BP continues to be elevated. Administered prn Hydralazine and rechecked BP an hour later with no improvement. Paged Dr. Konrad DoloresMerrell with update to see if new orders are needed. Updated Kirsten Kelly who confirmed that he did not feel comfortable discharging pt with BP still elevated. CSW updated and transportation canceled.   Paged Dr. Konrad DoloresMerrell again and received orders to administer another 10 mg of IV hydralazine and [facility] since the doctors there will be able to titrate her medication to manage BP. Kirsten Kelly and CSW updated with information, and transportation set up again. Pt updated with plan and agreeable. Will continue to monitor

## 2016-10-02 NOTE — Clinical Social Work Note (Signed)
Clinical Social Worker facilitated patient discharge including contacting patient family and facility to confirm patient discharge plans.  Clinical information faxed to facility and family agreeable with plan.  CSW arranged ambulance transport via [facility] (1:00) [facility].  RN to call [phone] for report prior to discharge. Patient will go to room 211.  Clinical Social Worker will sign off for now as social work intervention is no longer needed. Please consult [place] again if new need arises.  [address]Kirsten Kelly, [place] [phone]

## 2016-10-02 NOTE — Progress Notes (Signed)
Report called to [place] Briggs-[facility] at [facility]. All questions/concerns addressed. IV removed and belongings gathered. [facility] to transport. Will continue to monitor

## 2016-10-02 NOTE — Clinical Social Work Note (Signed)
Clinical Social Work Assessment  Patient Details  Name: Kirsten Kelly MRN: [id number] Date of Birth: 1927/11/05  Date of referral:  10/02/16               Reason for consult:  Facility Placement                Permission sought to share information with:    Permission granted to share information::     Name::        Agency::     Relationship::     Contact Information:     Housing/Transportation Living arrangements for the past 2 months:  Single Family Home Source of Information:  Patient Patient Interpreter Needed:  None Criminal Activity/Legal Involvement Pertinent to Current Situation/Hospitalization:  No - Comment as needed Significant Relationships:  Friend Lives with:  Self Do you feel safe going back to the place where you live?    Need for family participation in patient care:     Care giving concerns:  No family or friends present at bedside during initial assessment. Pt reports she has no family in the area but has friends who serve as support.   Social Worker assessment / plan:  CSW spoke with pt at bedside to complete initial assessment. Pt lives home alone. Pt is agreeable to SNF placement at this time. Pt wants to stay as close to the hospital as possible. CSW explained the [facility] and pt was agreeable to [facility]. CSW will follow up with facility.   Employment status:  Retired Health and safety inspectornsurance information:  Medicare PT Recommendations:  Skilled Nursing Facility Information / Referral to community resources:  Skilled Nursing Facility  Patient/Family's Response to care:  Pt verbalized understanding of CSW role and expressed appreciation for support. Pt denies any concern regarding pt care at this time.   Patient/Family's Understanding of and Emotional Response to Diagnosis, Current Treatment, and Prognosis:  Pt understanding and realistic regarding physical limitations. Pt understands the need for SNF placement at d/c. Pt agreeable to SNF  placement at d/c, at this time. Pt's responses emotionally appropriate during conversation with CSW. Pt denies any concern regarding treatment plan at this time. CSW will continue to provide support and facilitate d/c needs.   Emotional Assessment Appearance:  Appears stated age Attitude/Demeanor/Rapport:   (Patient was appropriate.) Affect (typically observed):  Accepting, Appropriate, Calm Orientation:  Oriented to Place, Oriented to  Time, Oriented to Situation, Oriented to Self Alcohol / Substance use:  Not Applicable Psych involvement (Current and /or in the community):  No (Comment)  Discharge Needs  Concerns to be addressed:  No discharge needs identified Readmission within the last 30 days:  No Current discharge risk:  Dependent with Mobility Barriers to Discharge:  Continued Medical Work up   [employer] IncBridget A Mayton, LCSW 10/02/2016, 10:11 AM

## 2016-10-04 ENCOUNTER — Non-Acute Institutional Stay (SKILLED_NURSING_FACILITY): Payer: Medicare Other | Admitting: Internal Medicine

## 2016-10-04 ENCOUNTER — Encounter: Payer: Self-pay | Admitting: Internal Medicine

## 2016-10-04 DIAGNOSIS — I10 Essential (primary) hypertension: Secondary | ICD-10-CM | POA: Diagnosis not present

## 2016-10-04 DIAGNOSIS — S42201D Unspecified fracture of upper end of right humerus, subsequent encounter for fracture with routine healing: Secondary | ICD-10-CM | POA: Diagnosis not present

## 2016-10-04 DIAGNOSIS — E119 Type 2 diabetes mellitus without complications: Secondary | ICD-10-CM | POA: Diagnosis not present

## 2016-10-04 NOTE — Assessment & Plan Note (Signed)
A1c will be checked Jentadueto 2.11/998 change to metformin 500 mg twice a day

## 2016-10-04 NOTE — Assessment & Plan Note (Signed)
HCTZ will be continued, carvedilol dose will be increased  ARB will be discontinued

## 2016-10-04 NOTE — Progress Notes (Signed)
Facility Location: [facility]  Room Number: 211-B  Code Status: Full Code   PCP: Cala Bradford, MD [phone] [address] [place] [address]    This is a comprehensive admission note to [facility] performed on this date less than 30 days from date of admission. Included are preadmission medical/surgical history;reconciled medication list; family history; social history and comprehensive review of systems.  Corrections and additions to the records were documented . Comprehensive physical exam was also performed. Additionally a clinical summary was entered for each active diagnosis pertinent to this admission in the Problem List to enhance continuity of care.   HPI: The patient was hospitalized 3/2-10/01/16 for proximal humeral fracture. The fracture occurred when she rolled out of bed 2/28. There was no cardiac or neurologic prodrome prior to the fall.  She also denied any hypoglycemic event prior to the fall . Open reduction with internal fixation of the proximal humerus was performed 3/2. The patient had failed conservative treatments and had severe unremitting pain affecting sleep and activities of daily living. Hypertension was documented with blood pressures as high as 206/92. The patient is on carvedilol, enalapril, & olmesartan/HCTZ. On 3/6 BUN was 11 and creatinine 0.59. Potassium was 3.3. She had a normal CBC. On dual oral agent glucoses ranged 158-176.  Past medical and surgical history: As delineated above. She has had a abdominal hysterectomy.  Social history: Confirmed  Family history: Noncontributory due to age  Review of systems: She describes some increased thirst as well as urinary frequency. She also describes burning in her feet. Her last A1c was in August 2017. She states that podiatry and ophthalmologic exams are up-to-date. FBS run 113-120 Blood pressures vary from 130-160/70.  She does have extrinsic  rhinoconjunctivitis symptoms for which she takes Zyrtec as needed.  Constitutional: No fever,significant weight change, fatigue  Eyes: No redness, discharge, pain, vision change ENT/mouth: No nasal congestion,  purulent discharge, earache,change in hearing ,sore throat  Cardiovascular: No chest pain, palpitations,paroxysmal nocturnal dyspnea, claudication, edema  Respiratory: No cough, sputum production,hemoptysis, DOE , significant snoring,apnea  Gastrointestinal: No heartburn,dysphagia,abdominal pain, nausea / vomiting,rectal bleeding, melena,change in bowels Genitourinary: No dysuria,hematuria, pyuria,  incontinence, nocturia Musculoskeletal: No joint stiffness, joint swelling, weakness,pain Dermatologic: No rash, pruritus, change in appearance of skin Neurologic: No dizziness,headache,syncope, seizures Psychiatric: No significant anxiety , depression, insomnia, anorexia Endocrine: No change in hair/skin/ nails, excessive hunger  Hematologic/lymphatic: No significant bruising, lymphadenopathy,abnormal bleeding Allergy/immunology: No  urticaria, angioedema  Physical exam:  Pertinent or positive findings: she appears dramatically younger than her stated age. There is minimal ptosis of the left eye. Pedal pulses are decreased. Reflexes at the knees are 0+. The right upper extremity is in a sling.  General appearance:Adequately nourished; no acute distress , increased work of breathing is present.   Lymphatic: No lymphadenopathy about the head, neck, axilla . Eyes: No conjunctival inflammation or lid edema is present. There is no scleral icterus. Ears:  External ear exam shows no significant lesions or deformities.   Nose:  External nasal examination shows no deformity or inflammation. Nasal mucosa are pink and moist without lesions ,exudates Oral exam: lips and gums are healthy appearing.There is no oropharyngeal erythema or exudate . Neck:  No thyromegaly, masses, tenderness noted.      Heart:  Normal rate and regular rhythm. S1 and S2 normal without gallop, murmur, click, rub .  Lungs:Chest clear to auscultation without wheezes, rhonchi,rales , rubs. Abdomen:Bowel sounds are normal. Abdomen  is soft and nontender with no organomegaly, hernias,masses. GU: deferred as previously addressed. Extremities:  No cyanosis, clubbing,edema  Neurologic exam : Balance,Rhomberg,finger to nose testing could not be completed due to clinical state Skin: Warm & dry w/o tenting. No significant lesions or rash.  See clinical summary under each active problem in the Problem List with associated updated therapeutic plan

## 2016-10-04 NOTE — Patient Instructions (Signed)
See assessment and plan under each diagnosis in the problem list and acutely for this visit 

## 2016-10-04 NOTE — Assessment & Plan Note (Signed)
PT/OT at SNF °

## 2016-10-09 ENCOUNTER — Non-Acute Institutional Stay (SKILLED_NURSING_FACILITY): Payer: Medicare Other | Admitting: Internal Medicine

## 2016-10-09 ENCOUNTER — Encounter: Payer: Self-pay | Admitting: Internal Medicine

## 2016-10-09 DIAGNOSIS — I10 Essential (primary) hypertension: Secondary | ICD-10-CM | POA: Diagnosis not present

## 2016-10-09 DIAGNOSIS — S42294D Other nondisplaced fracture of upper end of right humerus, subsequent encounter for fracture with routine healing: Secondary | ICD-10-CM | POA: Diagnosis not present

## 2016-10-09 DIAGNOSIS — E119 Type 2 diabetes mellitus without complications: Secondary | ICD-10-CM

## 2016-10-09 NOTE — Assessment & Plan Note (Signed)
09/28/16 Olmesartan/HCTZ 40/25 mg will be resumed. Enalipril will be held until clarified by Dr. Cliffton AstersWhite; [facility]. Carvedilol dose will be 6.25 mg twice a day

## 2016-10-09 NOTE — Progress Notes (Signed)
Facility Location: [facility]  Room Number: 211  Code Status:   PCP: Cala BradfordWHITE,CYNTHIA S, MD [phone] [address] [place] [id number]   The patient is being discharged [facility] on 10/10/2016 by Marga MelnickWilliam Malaya Cagley MD.  The medical history in this facility was reviewed and summarized and medical problem list was updated. Time spent and note content is documented as follows.  Summary of [facility]: [facility] after being hospitalized for open reduction with internal fixation of the proximal humerus 3/2. The fracture actually occurred 2/28, but the patient failed conservative treatments and had severe unremitting pain affecting sleep and activities of daily living. While hospitalized blood pressure was noted be as high as 206/92. On  A combination oral agent glucoses ranged 158-176. At the SNF she was switched to metformin 500 mg twice a day from the dual agent Jentadueto for cost reasons. The tramadol pain frequency was weaned .  At the SNF labs were rechecked she had minor hypokalemia with a value 3.3. Renal function was normal. She exhibited no postop anemia. At the SNF Olmesartan/HCTZ was changed to HCTZ 12.5 mg daily & the ACE inhibitor enalapril 20 mg continued Carvedilol increased to 12.5 mg twice a day. A1c was ordered 3/8 but is pending. She will be going home with help from friends.  Review of systems: She continues to have some pain at the operative site. She states she takes the tramadol 2-3 times per day as needed. Negative FAO:[id number]:Constitutional: No fever,significant weight change, fatigue  Eyes: No redness, discharge, pain, vision change ENT/mouth: No nasal congestion,  purulent discharge, earache,change in hearing ,sore throat  Cardiovascular: No chest pain, palpitations,paroxysmal nocturnal dyspnea, claudication, edema  Respiratory: No cough, sputum  production,hemoptysis, DOE , significant snoring,apnea  Gastrointestinal: No heartburn,dysphagia,abdominal pain, nausea / vomiting,rectal bleeding, melena,change in bowels Genitourinary: No dysuria,hematuria, pyuria,  incontinence, nocturia Dermatologic: No rash, pruritus, change in appearance of skin Neurologic: No dizziness,headache,syncope, seizures, numbness , tingling Psychiatric: No significant anxiety , depression, insomnia, anorexia Endocrine: No change in hair/skin/ nails, excessive thirst, excessive hunger, excessive urination  Hematologic/lymphatic: No significant bruising, lymphadenopathy,abnormal bleeding Allergy/immunology: No itchy/ watery eyes, significant sneezing, urticaria, angioedema  Physical exam:  Pertinent or positive findings: She is alert and oriented and appears much younger than her stated age of 83. She has trace edema at the sock line. Pedal pulses are decreased. Right upper extremity is in a sling.  General appearance:Adequately nourished; no acute distress , increased work of breathing is present.   Lymphatic: No lymphadenopathy about the head, neck, axilla . Eyes: No conjunctival inflammation or lid edema is present. There is no scleral icterus. Ears:  External ear exam shows no significant lesions or deformities.   Nose:  External nasal examination shows no deformity or inflammation. Nasal mucosa are pink and moist without lesions ,exudates Oral exam: lips and gums are healthy appearing.There is no oropharyngeal erythema or exudate . Neck:  No thyromegaly, masses, tenderness noted.    Heart:  Normal rate and regular rhythm. S1 and S2 normal without gallop, murmur, click, rub .  Lungs:Chest clear to auscultation without wheezes, rhonchi,rales , rubs. Abdomen:Bowel sounds are normal. Abdomen is soft and nontender with no organomegaly, hernias,masses. GU: deferred as previously addressed. Extremities:  No cyanosis, clubbing  Skin: Warm & dry w/o tenting. No  significant lesions or rash.  See clinical summary of Discharge Diagnoses in the Problem List with associated updated  therapeutic plan  Discharge instructions were written and discharge instructions provided. Follow-up will be by the primary care physician in seven - 10 days. The med changes made at the SNF for cost reasons will revert to her previous medications consisting of carvedilol 6.25 mg twice a day, Olmesartan/HCTZ 40/25 mg daily. Enalapril will be held until she sees Dr. Cliffton Asters as the combination of an ACE inhibitor and ARB would be relatively contraindicated. Ramon Dredge will be resumed in place of metformin. Tramadol will be decreased to 25 mg every 8 hours as needed. It is noted that she's also on the SSRI sertraline.

## 2016-10-09 NOTE — Assessment & Plan Note (Signed)
A1c ordered 10/04/16 at SNF pending. If not completed this will be deferred to Dr. Cliffton AstersWhite, PCP. She'll resume the Jentadueto (listed in discharge summary from hospital) or Janumet (listed in [facility] as per phone consultation)

## 2016-10-09 NOTE — Assessment & Plan Note (Signed)
Appointment 10/12/16 with Dr. Annell GreeningMark Yates at 1:30 PM

## 2016-10-09 NOTE — Patient Instructions (Signed)
See assessment and plan under each diagnosis in the problem list and acutely for this visit 

## 2016-10-12 ENCOUNTER — Encounter (INDEPENDENT_AMBULATORY_CARE_PROVIDER_SITE_OTHER): Payer: Self-pay | Admitting: Orthopaedic Surgery

## 2016-10-12 ENCOUNTER — Ambulatory Visit (INDEPENDENT_AMBULATORY_CARE_PROVIDER_SITE_OTHER): Payer: Medicare Other

## 2016-10-12 ENCOUNTER — Ambulatory Visit (INDEPENDENT_AMBULATORY_CARE_PROVIDER_SITE_OTHER): Payer: Medicare Other | Admitting: Orthopaedic Surgery

## 2016-10-12 VITALS — BP 133/77 | HR 73 | Ht 66.0 in | Wt 171.0 lb

## 2016-10-12 DIAGNOSIS — S42201D Unspecified fracture of upper end of right humerus, subsequent encounter for fracture with routine healing: Secondary | ICD-10-CM

## 2016-10-12 NOTE — Progress Notes (Signed)
   Post-Op Visit Note   Patient: Kirsten Kelly           Date of Birth: 06-26-1928           MRN: [id number] Visit Date: 10/12/2016 PCP: Cala BradfordWHITE,CYNTHIA S, MD   Assessment & Plan:  Chief Complaint: No chief complaint on file.  Visit Diagnoses:  1. Closed fracture of proximal end of right humerus with routine healing, unspecified fracture morphology, subsequent encounter     Plan: Follow up return in 4 weeks for repeat x-rays.  Follow-Up Instructions: No Follow-up on file.   Orders:  Orders Placed This Encounter  Procedures  . XR Shoulder Right   No orders of the defined types were placed in this encounter.  HPI Patient presents for first post op appointment. She is status post ORIF Right Proximal Humerus Fracture on 09/28/2016. She is 2 weeks post op. She states that she is doing well. She is not having any problems. Staples are removed and steris applied.   Imaging: No results found.  PMFS History: Patient Active Problem List   Diagnosis Date Noted  . Depression 10/02/2016  . Essential hypertension 10/02/2016  . History of depression   . Controlled type 2 diabetes mellitus without complication, without long-term current use of insulin (HCC)   . Closed fracture of right proximal humerus   . Proximal humerus fracture 09/28/2016  . Diabetes mellitus (HCC) 01/25/2011   Past Medical History:  Diagnosis Date  . Arthritis   . Diabetes mellitus   . Hypertension     No family history on file.  Past Surgical History:  Procedure Laterality Date  . ABDOMINAL HYSTERECTOMY    . ORIF HUMERUS FRACTURE Right 09/28/2016   Procedure: OPEN REDUCTION INTERNAL FIXATION (ORIF) PROXIMAL HUMERUS FRACTURE;  Surgeon: Eldred MangesMark C Yates, MD;  Location: [facility] OR;  Service: Orthopedics;  Laterality: Right;   Social History   Occupational History  . Not on file.   Social History Main Topics  . Smoking status: Never Smoker  . Smokeless tobacco: Never Used  . Alcohol use Yes     Comment:  socially  . Drug use: No  . Sexual activity: Not on file

## 2016-10-25 DIAGNOSIS — E119 Type 2 diabetes mellitus without complications: Secondary | ICD-10-CM | POA: Diagnosis not present

## 2016-10-25 DIAGNOSIS — F325 Major depressive disorder, single episode, in full remission: Secondary | ICD-10-CM | POA: Diagnosis not present

## 2016-10-25 DIAGNOSIS — Z7984 Long term (current) use of oral hypoglycemic drugs: Secondary | ICD-10-CM | POA: Diagnosis not present

## 2016-10-25 DIAGNOSIS — I10 Essential (primary) hypertension: Secondary | ICD-10-CM | POA: Diagnosis not present

## 2016-10-25 DIAGNOSIS — S42201D Unspecified fracture of upper end of right humerus, subsequent encounter for fracture with routine healing: Secondary | ICD-10-CM | POA: Diagnosis not present

## 2016-10-25 DIAGNOSIS — E784 Other hyperlipidemia: Secondary | ICD-10-CM | POA: Diagnosis not present

## 2016-11-13 ENCOUNTER — Ambulatory Visit (INDEPENDENT_AMBULATORY_CARE_PROVIDER_SITE_OTHER): Payer: Medicare Other

## 2016-11-13 ENCOUNTER — Encounter (INDEPENDENT_AMBULATORY_CARE_PROVIDER_SITE_OTHER): Payer: Self-pay | Admitting: Orthopaedic Surgery

## 2016-11-13 ENCOUNTER — Ambulatory Visit (INDEPENDENT_AMBULATORY_CARE_PROVIDER_SITE_OTHER): Payer: Medicare Other | Admitting: Podiatry

## 2016-11-13 ENCOUNTER — Encounter: Payer: Self-pay | Admitting: Podiatry

## 2016-11-13 ENCOUNTER — Ambulatory Visit (INDEPENDENT_AMBULATORY_CARE_PROVIDER_SITE_OTHER): Payer: Medicare Other | Admitting: Orthopaedic Surgery

## 2016-11-13 VITALS — BP 189/83 | HR 66 | Ht 66.0 in | Wt 171.0 lb

## 2016-11-13 DIAGNOSIS — M79609 Pain in unspecified limb: Secondary | ICD-10-CM

## 2016-11-13 DIAGNOSIS — E119 Type 2 diabetes mellitus without complications: Secondary | ICD-10-CM

## 2016-11-13 DIAGNOSIS — B351 Tinea unguium: Secondary | ICD-10-CM | POA: Diagnosis not present

## 2016-11-13 DIAGNOSIS — S42201D Unspecified fracture of upper end of right humerus, subsequent encounter for fracture with routine healing: Secondary | ICD-10-CM

## 2016-11-13 DIAGNOSIS — M2010 Hallux valgus (acquired), unspecified foot: Secondary | ICD-10-CM

## 2016-11-13 NOTE — Progress Notes (Signed)
   Office Visit Note   Patient: Kirsten Kelly           Date of Birth: November 08, 1927           MRN: [id number] Visit Date: 11/13/2016              Requested by: Laurann Montana, MD [phone] [address] [place], [place] [address] PCP: Cala Bradford, MD   Assessment & Plan: Visit Diagnoses:  1. Closed fracture of proximal end of right humerus with routine healing, unspecified fracture morphology, subsequent encounter     Plan: She can resume work which is part-time work in [employer] starting May 1. I'll check her back again on appearing basis we discussed home exercise activities that she can do such as scrubbing her floor, dusting wall walking with fingertips etc.  Follow-Up Instructions: No Follow-up on file.   Orders:  Orders Placed This Encounter  Procedures  . XR Shoulder Right   No orders of the defined types were placed in this encounter.     Procedures: No procedures performed   Clinical Data: No additional findings.   Subjective: Chief Complaint  Patient presents with  . Right Shoulder - Routine Post Op    HPI follow-up ORIF right proximal humerus fracture. She is now 46 days out incisions well-healed. She can get her arm up to 90 she can reach her mouth reach the her forehead. X-ray shows fracture has healed and she can discontinue the sling.  Review of Systems Updated and unchanged.  Objective: Vital Signs: BP (!) 189/83   Pulse 66   Ht  (1.676 m)   Wt 171 lb (77.6 kg)   BMI 27.60 kg/m   Physical Exam well-healed the skin incision deltopectoral. No pain with internal/external rotation of the shoulder. She has some mild periscapular atrophy.  Ortho Exam  Specialty Comments:  No specialty comments available.  Imaging: No results found.   PMFS History: Patient Active Problem List   Diagnosis Date Noted  . Depression 10/02/2016  . Essential hypertension 10/02/2016  . History of depression   . Controlled type 2 diabetes mellitus  without complication, without long-term current use of insulin (HCC)   . Closed fracture of right proximal humerus   . Proximal humerus fracture 09/28/2016  . Diabetes mellitus (HCC) 01/25/2011   Past Medical History:  Diagnosis Date  . Arthritis   . Diabetes mellitus   . Hypertension     No family history on file.  Past Surgical History:  Procedure Laterality Date  . ABDOMINAL HYSTERECTOMY    . ORIF HUMERUS FRACTURE Right 09/28/2016   Procedure: OPEN REDUCTION INTERNAL FIXATION (ORIF) PROXIMAL HUMERUS FRACTURE;  Surgeon: Eldred Manges, MD;  Location: [facility] OR;  Service: Orthopedics;  Laterality: Right;   Social History   Occupational History  . Not on file.   Social History Main Topics  . Smoking status: Never Smoker  . Smokeless tobacco: Never Used  . Alcohol use Yes     Comment: socially  . Drug use: No  . Sexual activity: Not on file

## 2016-11-13 NOTE — Progress Notes (Addendum)
Patient ID: YAMIRA PAPA, female   DOB: Aug 13, 1927, 83 y.o.   MRN: [id number] Complaint:  Visit Type: Patient returns to my office for continued preventative foot care services. Complaint: Patient states" my nails have grown long and thick and become painful to walk and wear shoes" Patient has been diagnosed with DM with no foot complications. The patient presents for preventative foot care services. No changes to ROS  Podiatric Exam: Vascular: dorsalis pedis and posterior tibial pulses are palpable bilateral. Capillary return is immediate. Temperature gradient is WNL. Skin turgor WNL  Sensorium: Diminished  Semmes Weinstein monofilament test. Normal tactile sensation bilaterally. Nail Exam: Pt has thick disfigured discolored nails with subungual debris noted bilateral entire nail hallux through fifth toenails Ulcer Exam: There is no evidence of ulcer or pre-ulcerative changes or infection. Orthopedic Exam: Muscle tone and strength are WNL. No limitations in general ROM. No crepitus or effusions noted. Foot type and digits show no abnormalities. HAV  B/L Skin: No Porokeratosis. No infection or ulcers  Diagnosis:  Onychomycosis, , Pain in right toe, pain in left toes  Treatment & Plan Procedures and Treatment: Consent by patient was obtained for treatment procedures. The patient understood the discussion of treatment and procedures well. All questions were answered thoroughly reviewed. Debridement of mycotic and hypertrophic toenails, 1 through 5 bilateral and clearing of subungual debris. No ulceration, no infection noted. Check on diabetic footgear. Return Visit-Office Procedure: Patient instructed to return to the office for a follow up visit 3 months for continued evaluation and treatment.    Helane Gunther DPM

## 2016-12-14 DIAGNOSIS — E784 Other hyperlipidemia: Secondary | ICD-10-CM | POA: Diagnosis not present

## 2016-12-14 DIAGNOSIS — Z7984 Long term (current) use of oral hypoglycemic drugs: Secondary | ICD-10-CM | POA: Diagnosis not present

## 2016-12-14 DIAGNOSIS — I10 Essential (primary) hypertension: Secondary | ICD-10-CM | POA: Diagnosis not present

## 2016-12-14 DIAGNOSIS — L509 Urticaria, unspecified: Secondary | ICD-10-CM | POA: Diagnosis not present

## 2016-12-14 DIAGNOSIS — Z0000 Encounter for general adult medical examination without abnormal findings: Secondary | ICD-10-CM | POA: Diagnosis not present

## 2016-12-14 DIAGNOSIS — R634 Abnormal weight loss: Secondary | ICD-10-CM | POA: Diagnosis not present

## 2016-12-14 DIAGNOSIS — E119 Type 2 diabetes mellitus without complications: Secondary | ICD-10-CM | POA: Diagnosis not present

## 2016-12-14 DIAGNOSIS — M8588 Other specified disorders of bone density and structure, other site: Secondary | ICD-10-CM | POA: Diagnosis not present

## 2016-12-14 DIAGNOSIS — D696 Thrombocytopenia, unspecified: Secondary | ICD-10-CM | POA: Diagnosis not present

## 2017-01-31 DIAGNOSIS — M810 Age-related osteoporosis without current pathological fracture: Secondary | ICD-10-CM | POA: Diagnosis not present

## 2017-01-31 DIAGNOSIS — M8588 Other specified disorders of bone density and structure, other site: Secondary | ICD-10-CM | POA: Diagnosis not present

## 2017-02-13 ENCOUNTER — Encounter: Payer: Self-pay | Admitting: Podiatry

## 2017-02-13 ENCOUNTER — Ambulatory Visit (INDEPENDENT_AMBULATORY_CARE_PROVIDER_SITE_OTHER): Payer: Medicare Other | Admitting: Podiatry

## 2017-02-13 DIAGNOSIS — M79609 Pain in unspecified limb: Secondary | ICD-10-CM

## 2017-02-13 DIAGNOSIS — E119 Type 2 diabetes mellitus without complications: Secondary | ICD-10-CM

## 2017-02-13 DIAGNOSIS — B351 Tinea unguium: Secondary | ICD-10-CM

## 2017-02-13 DIAGNOSIS — M2010 Hallux valgus (acquired), unspecified foot: Secondary | ICD-10-CM

## 2017-02-13 NOTE — Progress Notes (Signed)
Patient ID: Kirsten Kelly, female   DOB: 1928-01-05, 83 y.o.   MRN: [id number] Complaint:  Visit Type: Patient returns to my office for continued preventative foot care services. Complaint: Patient states" my nails have grown long and thick and become painful to walk and wear shoes" Patient has been diagnosed with DM with no foot complications. The patient presents for preventative foot care services. No changes to ROS  Podiatric Exam: Vascular: dorsalis pedis and posterior tibial pulses are palpable bilateral. Capillary return is immediate. Temperature gradient is WNL. Skin turgor WNL  Sensorium: Diminished  Semmes Weinstein monofilament test. Normal tactile sensation bilaterally. Nail Exam: Pt has thick disfigured discolored nails with subungual debris noted bilateral entire nail hallux through fifth toenails Ulcer Exam: There is no evidence of ulcer or pre-ulcerative changes or infection. Orthopedic Exam: Muscle tone and strength are WNL. No limitations in general ROM. No crepitus or effusions noted. Foot type and digits show no abnormalities. HAV  B/L Skin: No Porokeratosis. No infection or ulcers  Diagnosis:  Onychomycosis, , Pain in right toe, pain in left toes  Treatment & Plan Procedures and Treatment: Consent by patient was obtained for treatment procedures. The patient understood the discussion of treatment and procedures well. All questions were answered thoroughly reviewed. Debridement of mycotic and hypertrophic toenails, 1 through 5 bilateral and clearing of subungual debris. No ulceration, no infection noted. Return Visit-Office Procedure: Patient instructed to return to the office for a follow up visit 3 months for continued evaluation and treatment.    Helane GuntherGregory Adrian Dinovo DPM

## 2017-03-22 DIAGNOSIS — E119 Type 2 diabetes mellitus without complications: Secondary | ICD-10-CM | POA: Diagnosis not present

## 2017-03-22 DIAGNOSIS — M810 Age-related osteoporosis without current pathological fracture: Secondary | ICD-10-CM | POA: Diagnosis not present

## 2017-03-22 DIAGNOSIS — I10 Essential (primary) hypertension: Secondary | ICD-10-CM | POA: Diagnosis not present

## 2017-03-22 DIAGNOSIS — Z7984 Long term (current) use of oral hypoglycemic drugs: Secondary | ICD-10-CM | POA: Diagnosis not present

## 2017-03-22 DIAGNOSIS — F325 Major depressive disorder, single episode, in full remission: Secondary | ICD-10-CM | POA: Diagnosis not present

## 2017-03-22 DIAGNOSIS — Z23 Encounter for immunization: Secondary | ICD-10-CM | POA: Diagnosis not present

## 2017-03-22 DIAGNOSIS — E784 Other hyperlipidemia: Secondary | ICD-10-CM | POA: Diagnosis not present

## 2017-04-02 ENCOUNTER — Other Ambulatory Visit: Payer: Self-pay | Admitting: Family Medicine

## 2017-04-02 DIAGNOSIS — Z1231 Encounter for screening mammogram for malignant neoplasm of breast: Secondary | ICD-10-CM

## 2017-04-29 ENCOUNTER — Ambulatory Visit
Admission: RE | Admit: 2017-04-29 | Discharge: 2017-04-29 | Disposition: A | Discharge status: Home / Self Care | Payer: Medicare Other | Source: Ambulatory Visit | Attending: Family Medicine | Admitting: Family Medicine

## 2017-04-29 DIAGNOSIS — Z1231 Encounter for screening mammogram for malignant neoplasm of breast: Secondary | ICD-10-CM

## 2017-05-15 ENCOUNTER — Ambulatory Visit: Payer: Medicare Other | Admitting: Podiatry

## 2017-07-17 ENCOUNTER — Ambulatory Visit: Payer: Medicare Other | Admitting: Podiatry

## 2017-07-19 ENCOUNTER — Ambulatory Visit (INDEPENDENT_AMBULATORY_CARE_PROVIDER_SITE_OTHER): Payer: Medicare Other | Admitting: Podiatry

## 2017-07-19 ENCOUNTER — Encounter: Payer: Self-pay | Admitting: Podiatry

## 2017-07-19 DIAGNOSIS — M79609 Pain in unspecified limb: Secondary | ICD-10-CM

## 2017-07-19 DIAGNOSIS — M2010 Hallux valgus (acquired), unspecified foot: Secondary | ICD-10-CM

## 2017-07-19 DIAGNOSIS — E119 Type 2 diabetes mellitus without complications: Secondary | ICD-10-CM

## 2017-07-19 DIAGNOSIS — B351 Tinea unguium: Secondary | ICD-10-CM

## 2017-07-19 NOTE — Progress Notes (Signed)
Patient ID: Kirsten Kelly, female   DOB: 03-30-1928, 83 y.o.   MRN: [id number] Complaint:  Visit Type: Patient returns to my office for continued preventative foot care services. Complaint: Patient states" my nails have grown long and thick and become painful to walk and wear shoes" Patient has been diagnosed with DM with no foot complications. The patient presents for preventative foot care services. No changes to ROS  Podiatric Exam: Vascular: dorsalis pedis and posterior tibial pulses are palpable bilateral. Capillary return is immediate. Temperature gradient is WNL. Skin turgor WNL  Sensorium: Diminished  Semmes Weinstein monofilament test. Normal tactile sensation bilaterally. Nail Exam: Pt has thick disfigured discolored nails with subungual debris noted bilateral entire nail hallux through fifth toenails Ulcer Exam: There is no evidence of ulcer or pre-ulcerative changes or infection. Orthopedic Exam: Muscle tone and strength are WNL. No limitations in general ROM. No crepitus or effusions noted. Foot type and digits show no abnormalities. HAV  B/L Skin: No Porokeratosis. No infection or ulcers  Diagnosis:  Onychomycosis, , Pain in right toe, pain in left toes  Treatment & Plan Procedures and Treatment: Consent by patient was obtained for treatment procedures. The patient understood the discussion of treatment and procedures well. All questions were answered thoroughly reviewed. Debridement of mycotic and hypertrophic toenails, 1 through 5 bilateral and clearing of subungual debris. No ulceration, no infection noted.ABN signed for 2018.   Return Visit-Office Procedure: Patient instructed to return to the office for a follow up visit 3 months for continued evaluation and treatment.    Kirsten Kelly DPM

## 2017-10-03 DIAGNOSIS — H26492 Other secondary cataract, left eye: Secondary | ICD-10-CM | POA: Diagnosis not present

## 2017-10-03 DIAGNOSIS — H52223 Regular astigmatism, bilateral: Secondary | ICD-10-CM | POA: Diagnosis not present

## 2017-10-03 DIAGNOSIS — H5213 Myopia, bilateral: Secondary | ICD-10-CM | POA: Diagnosis not present

## 2017-10-03 DIAGNOSIS — E119 Type 2 diabetes mellitus without complications: Secondary | ICD-10-CM | POA: Diagnosis not present

## 2017-10-22 ENCOUNTER — Encounter: Payer: Self-pay | Admitting: Podiatry

## 2017-10-22 ENCOUNTER — Ambulatory Visit (INDEPENDENT_AMBULATORY_CARE_PROVIDER_SITE_OTHER): Payer: Medicare Other | Admitting: Podiatry

## 2017-10-22 DIAGNOSIS — B351 Tinea unguium: Secondary | ICD-10-CM

## 2017-10-22 DIAGNOSIS — M2010 Hallux valgus (acquired), unspecified foot: Secondary | ICD-10-CM

## 2017-10-22 DIAGNOSIS — M79609 Pain in unspecified limb: Secondary | ICD-10-CM | POA: Diagnosis not present

## 2017-10-22 DIAGNOSIS — E119 Type 2 diabetes mellitus without complications: Secondary | ICD-10-CM

## 2017-10-22 NOTE — Progress Notes (Signed)
Patient ID: Kirsten Kelly, female   DOB: 12-11-1927, 84 y.o.   MRN: [id number] Complaint:  Visit Type: Patient returns to my office for continued preventative foot care services. Complaint: Patient states" my nails have grown long and thick and become painful to walk and wear shoes" Patient has been diagnosed with DM with no foot complications. The patient presents for preventative foot care services. No changes to ROS  Podiatric Exam: Vascular: dorsalis pedis and posterior tibial pulses are palpable bilateral. Capillary return is immediate. Temperature gradient is WNL. Skin turgor WNL  Sensorium: Diminished  Semmes Weinstein monofilament test. Normal tactile sensation bilaterally. Nail Exam: Pt has thick disfigured discolored nails with subungual debris noted bilateral entire nail hallux through fifth toenails Ulcer Exam: There is no evidence of ulcer or pre-ulcerative changes or infection. Orthopedic Exam: Muscle tone and strength are WNL. No limitations in general ROM. No crepitus or effusions noted. Foot type and digits show no abnormalities. HAV  B/L Skin: No Porokeratosis. No infection or ulcers  Diagnosis:  Onychomycosis, , Pain in right toe, pain in left toes  Treatment & Plan Procedures and Treatment: Consent by patient was obtained for treatment procedures. The patient understood the discussion of treatment and procedures well. All questions were answered thoroughly reviewed. Debridement of mycotic and hypertrophic toenails, 1 through 5 bilateral and clearing of subungual debris. No ulceration, no infection noted.ABN signed for 2019.   Return Visit-Office Procedure: Patient instructed to return to the office for a follow up visit 3 months for continued evaluation and treatment.    Helane GuntherGregory Yessica Putnam DPM

## 2017-10-24 DIAGNOSIS — H26491 Other secondary cataract, right eye: Secondary | ICD-10-CM | POA: Diagnosis not present

## 2017-12-16 DIAGNOSIS — E785 Hyperlipidemia, unspecified: Secondary | ICD-10-CM | POA: Diagnosis not present

## 2017-12-16 DIAGNOSIS — Z0000 Encounter for general adult medical examination without abnormal findings: Secondary | ICD-10-CM | POA: Diagnosis not present

## 2017-12-16 DIAGNOSIS — M810 Age-related osteoporosis without current pathological fracture: Secondary | ICD-10-CM | POA: Diagnosis not present

## 2017-12-16 DIAGNOSIS — E1169 Type 2 diabetes mellitus with other specified complication: Secondary | ICD-10-CM | POA: Diagnosis not present

## 2017-12-16 DIAGNOSIS — I10 Essential (primary) hypertension: Secondary | ICD-10-CM | POA: Diagnosis not present

## 2017-12-16 DIAGNOSIS — R29818 Other symptoms and signs involving the nervous system: Secondary | ICD-10-CM | POA: Diagnosis not present

## 2017-12-16 DIAGNOSIS — F321 Major depressive disorder, single episode, moderate: Secondary | ICD-10-CM | POA: Diagnosis not present

## 2017-12-20 DIAGNOSIS — E119 Type 2 diabetes mellitus without complications: Secondary | ICD-10-CM | POA: Diagnosis not present

## 2017-12-20 DIAGNOSIS — E785 Hyperlipidemia, unspecified: Secondary | ICD-10-CM | POA: Diagnosis not present

## 2018-01-16 ENCOUNTER — Other Ambulatory Visit: Payer: Self-pay

## 2018-01-16 ENCOUNTER — Ambulatory Visit: Payer: Medicare Other | Attending: Family Medicine | Admitting: Physical Therapy

## 2018-01-16 ENCOUNTER — Encounter: Payer: Self-pay | Admitting: Physical Therapy

## 2018-01-16 DIAGNOSIS — R262 Difficulty in walking, not elsewhere classified: Secondary | ICD-10-CM | POA: Insufficient documentation

## 2018-01-16 DIAGNOSIS — R42 Dizziness and giddiness: Secondary | ICD-10-CM

## 2018-01-16 DIAGNOSIS — M6281 Muscle weakness (generalized): Secondary | ICD-10-CM | POA: Diagnosis not present

## 2018-01-16 DIAGNOSIS — R2681 Unsteadiness on feet: Secondary | ICD-10-CM | POA: Diagnosis not present

## 2018-01-16 NOTE — Therapy (Signed)
[facility] [address] [place], [place], [address] Phone: [phone]   Fax:  [phone]  Physical Therapy Evaluation  Patient Details  Name: Kirsten Kelly MRN: [id number] Date of Birth: Jun 07, 1928 Referring Provider:  Laurann Montana, MD   Encounter Date: 01/16/2018  PT End of Session - 01/16/18 2010    Visit Number  1    Number of Visits  9    Date for PT Re-Evaluation  02/21/18 date extended due to one week missed appts during POC    Authorization Type  Medicare    Authorization Time Period  6.20/19 to 04/16/2018    PT Start Time  0848    PT Stop Time  0929    PT Time Calculation (min)  41 min    Activity Tolerance  Patient tolerated treatment well    Behavior During Therapy  [facility] for tasks assessed/performed       Past Medical History:  Diagnosis Date  . Arthritis   . Diabetes mellitus   . Hypertension     Past Surgical History:  Procedure Laterality Date  . ABDOMINAL HYSTERECTOMY    . ORIF HUMERUS FRACTURE Right 09/28/2016   Procedure: OPEN REDUCTION INTERNAL FIXATION (ORIF) PROXIMAL HUMERUS FRACTURE;  Surgeon: Eldred Manges, MD;  [facility]: [facility] OR;  Service: Orthopedics;  Laterality: Right;    There were no vitals filed for this visit.   Subjective Assessment - 01/16/18 0855    Subjective  When I stand up I stagger and sometimes when I'm walking I stagger. When I'm lying down and go to sit up. It's been going on for quite awhile (was seen here for PT in 2015 for falls). Had near falls in her home, but catches herself. Approximately 1-2 times per week.     Pertinent History  PMH-depression, HTN, DM, fall Rt humerus fx 09/19/16 with ORIF; osteoporosis    Patient Stated Goals  make me walk straight    Currently in Pain?  No/denies         [facility] PT Assessment - 01/16/18 0849      Assessment   Medical Diagnosis  falls    Referring Provider   Laurann Montana, MD    Onset Date/Surgical Date  01/07/18 MD  referral    Prior Therapy  2015-16      Precautions   Precautions  Fall      Balance Screen   Has the patient fallen in the past 6 months  No reports last fall 08/2016    Has the patient had a decrease in activity level because of a fear of falling?   No    Is the patient reluctant to leave their home because of a fear of falling?   No      Home Environment   Living Environment  Private residence    Living Arrangements  Alone    Type of Home  Mobile home    Home Access  Ramped entrance    Home Layout  One level    Home Equipment  Cane - quad    Additional Comments  plans to get a shower seat (per MD recommendation); has tried suction grab bars without success      Prior Function   Level of Independence  Independent    Vocation  Retired    Leisure  look at [employer], walk the dog (15 minutes every other day)      Cognition   Overall Cognitive Status  Within Functional  Limits for tasks assessed      Observation/Other Assessments   Focus on Therapeutic Outcomes (FOTO)   NA      Sensation   Light Touch  Appears Intact tested with monofilatment 10/10 on each foot    Proprioception  Appears Intact bil ankles      Coordination   Gross Motor Movements are Fluid and Coordinated  Yes    Fine Motor Movements are Fluid and Coordinated  Yes    Heel Shin Test  WNL      ROM / Strength   AROM / PROM / Strength  AROM;Strength      AROM   Overall AROM   Within functional limits for tasks performed bil LEs      Strength   Overall Strength  Deficits    Overall Strength Comments  bil hip flexion 4+, knee extension 4, knee flexion 3+, ankles 5      Transfers   Transfers  Sit to Stand;Stand to Sit    Sit to Stand  6: Modified independent (Device/Increase time);Without upper extremity assist;From chair/3-in-1 incr time/effort    Five time sit to stand comments   14.38 sec norm for age 54.2 sec    Stand to Sit  6: Modified independent (Device/Increase time);Without upper extremity assist       Ambulation/Gait   Ambulation/Gait  Yes    Ambulation/Gait Assistance  6: Modified independent (Device/Increase time)    Ambulation Distance (Feet)  80 Feet 50, 50, 75    Assistive device  None reports she forgot her cane; "it's new to me"    Gait Pattern  Step-through pattern;Decreased arm swing - right;Decreased arm swing - left;Decreased stride length;Decreased trunk rotation;Wide base of support    Ambulation Surface  Level;Indoor    Gait velocity  32.8/14.37= 2.28 Ft/sec norm for 84 yo female 2.79 ft/sec      Functional Gait  Assessment   Gait assessed   Yes    Gait Level Surface  Walks 20 ft, slow speed, abnormal gait pattern, evidence for imbalance or deviates 10-15 in outside of the 12 in walkway width. Requires more than 7 sec to ambulate 20 ft. 13.78 with SBQC    Change in Gait Speed  Able to change speed, demonstrates mild gait deviations, deviates 6-10 in outside of the 12 in walkway width, or no gait deviations, unable to achieve a major change in velocity, or uses a change in velocity, or uses an assistive device.    Gait with Horizontal Head Turns  Performs head turns smoothly with slight change in gait velocity (eg, minor disruption to smooth gait path), deviates 6-10 in outside 12 in walkway width, or uses an assistive device.    Gait with Vertical Head Turns  Performs task with slight change in gait velocity (eg, minor disruption to smooth gait path), deviates 6 - 10 in outside 12 in walkway width or uses assistive device    Gait and Pivot Turn  Pivot turns safely within 3 sec and stops quickly with no loss of balance.    Step Over Obstacle  Is able to step over one shoe box (4.5 in total height) but must slow down and adjust steps to clear box safely. May require verbal cueing.    Gait with Narrow Base of Support  Ambulates less than 4 steps heel to toe or cannot perform without assistance.    Gait with Eyes Closed  Walks 20 ft, slow speed, abnormal gait pattern, evidence for  imbalance,  deviates 10-15 in outside 12 in walkway width. Requires more than 9 sec to ambulate 20 ft.    Ambulating Backwards  Walks 20 ft, slow speed, abnormal gait pattern, evidence for imbalance, deviates 10-15 in outside 12 in walkway width.    Steps  Two feet to a stair, must use rail.    Total Score  14    FGA comment:  <23 indicates incr fall risk                Objective measurements completed on examination: See above findings.              PT Education - 01/16/18 2010    Education Details  results of PT eval and PT POC    Person(s) Educated  Patient    Methods  Explanation    Comprehension  Verbalized understanding          PT Long Term Goals - 01/16/18 2026      PT LONG TERM GOAL #1   Title  Patient will be independent with HEP for strength and balance. She will be able to verbalize a plan for community-based exercise upon completion of PT (Target for all LTGs 02/21/18-date extended due to lack of appt availability during one week of her course of PT)    Time  4    Period  Weeks    Status  New      PT LONG TERM GOAL #2   Title  Patient will improve gait velocity with appropriate assistive device to >=2.62 ft/sec (safe velocity for community ambulation)    Time  4    Period  Weeks    Status  New      PT LONG TERM GOAL #3   Title  Patient will improve FGA to >=19 to demonstrate improved balance and lesser fall risk.     Time  4    Period  Weeks    Status  New      PT LONG TERM GOAL #4   Title  Patient will ambulate over level indoor and outdoor surfaces modified independent x 400 ft    Time  4    Period  Weeks    Status  New             Plan - 01/16/18 2014    Clinical Impression Statement  Patient referred to PT due to decreased balance and h/o falls. Patient reports last fall in Feb 2018, however per MD note she reported 2 falls in the past 6 months. She has recently begun using a cane on physician's recommendation and due to  multiple near falls. Testing demonstrated at increased risk for falls (FGA, 5 times sit to stand, gait velocity below normal for age). Patient can benefit from PT to address the deficits listed below via the interventions listed below.     History and Personal Factors relevant to plan of care:  PMH-depression, HTN, DM, fall Rt humerus fx 09/19/16 with ORIF; osteoporosis; Personal history-lives alone/limited support;     Clinical Presentation  Evolving    Clinical Presentation due to:  1-2 near falls per week    Clinical Decision Making  Moderate >3 co-morbidities, personal factors    Rehab Potential  Good    PT Frequency  2x / week    PT Duration  4 weeks    PT Treatment/Interventions  ADLs/Self Care Home Management;DME Instruction;Gait training;Neuromuscular re-education;Balance training;Therapeutic exercise;Therapeutic activities;Functional mobility training;Stair training;Patient/family education;Passive range of motion;Vestibular  PT Next Visit Plan  check orthostatics; screen for vestibulardeficit (?HIT); instruct in safe use of cane; initiate HEP (?Otago, corner ex's)    Consulted and Agree with Plan of Care  Patient       Patient will benefit from skilled therapeutic intervention in order to improve the following deficits and impairments:  Abnormal gait, Decreased balance, Decreased knowledge of use of DME, Decreased strength, Decreased safety awareness, Dizziness  Visit Diagnosis: Unsteadiness on feet - Plan: PT plan of care cert/re-cert  Dizziness and giddiness - Plan: PT plan of care cert/re-cert  Muscle weakness (generalized) - Plan: PT plan of care cert/re-cert  Difficulty in walking, not elsewhere classified - Plan: PT plan of care cert/re-cert     Problem List Patient Active Problem List   Diagnosis Date Noted  . Depression 10/02/2016  . Essential hypertension 10/02/2016  . History of depression   . Controlled type 2 diabetes mellitus without complication, without  long-term current use of insulin (HCC)   . Closed fracture of right proximal humerus   . Proximal humerus fracture 09/28/2016  . Diabetes mellitus (HCC) 01/25/2011    Zena Amos, PT 01/16/2018, 8:34 PM  [facility] [address] [place], [place], [address] Phone: [phone]   Fax:  [phone]  Name: ANELLY SAMARIN MRN: [id number] Date of Birth: 1928-02-08

## 2018-01-21 ENCOUNTER — Ambulatory Visit: Payer: Medicare Other | Admitting: Podiatry

## 2018-01-23 ENCOUNTER — Ambulatory Visit: Payer: Medicare Other | Admitting: Physical Therapy

## 2018-01-23 ENCOUNTER — Encounter: Payer: Self-pay | Admitting: Physical Therapy

## 2018-01-23 DIAGNOSIS — M6281 Muscle weakness (generalized): Secondary | ICD-10-CM | POA: Diagnosis not present

## 2018-01-23 DIAGNOSIS — R2681 Unsteadiness on feet: Secondary | ICD-10-CM

## 2018-01-23 DIAGNOSIS — R42 Dizziness and giddiness: Secondary | ICD-10-CM

## 2018-01-23 DIAGNOSIS — R262 Difficulty in walking, not elsewhere classified: Secondary | ICD-10-CM | POA: Diagnosis not present

## 2018-01-23 NOTE — Therapy (Signed)
[facility] [address] [place], [place], [address] Phone: [phone]   Fax:  [phone]  Physical Therapy Treatment  Patient Details  Name: Kirsten Kelly MRN: [id number] Date of Birth: Jan 24, 1928 Referring Provider:  Laurann Montana, MD   Encounter Date: 01/23/2018  PT End of Session - 01/23/18 1239    Visit Number  2    Number of Visits  9    Date for PT Re-Evaluation  02/21/18    Authorization Type  Medicare    Authorization Time Period  6.20/19 to 04/16/2018    PT Start Time  1100    PT Stop Time  1145    PT Time Calculation (min)  45 min    Equipment Utilized During Treatment  Gait belt    Activity Tolerance  Patient tolerated treatment well    Behavior During Therapy  [facility] for tasks assessed/performed       Past Medical History:  Diagnosis Date  . Arthritis   . Diabetes mellitus   . Hypertension     Past Surgical History:  Procedure Laterality Date  . ABDOMINAL HYSTERECTOMY    . ORIF HUMERUS FRACTURE Right 09/28/2016   Procedure: OPEN REDUCTION INTERNAL FIXATION (ORIF) PROXIMAL HUMERUS FRACTURE;  Surgeon: Eldred Manges, MD;  [facility]: [facility] OR;  Service: Orthopedics;  Laterality: Right;    There were no vitals filed for this visit.  Subjective Assessment - 01/23/18 1102    Subjective  No changes since last visit. Pt forgot her quad cane at the house, she said she would bring it with her next treatment.     Pertinent History  PMH-depression, HTN, DM, fall Rt humerus fx 09/19/16 with ORIF; osteoporosis    Patient Stated Goals  make me walk straight       Vestibular Assessment - 01/23/18 1105      Orthostatics   BP supine (x 5 minutes)  134/82    HR supine (x 5 minutes)  53    BP standing (after 1 minute)  146/70    HR standing (after 1 minute)  55    BP standing (after 3 minutes)  155/85    HR standing (after 3 minutes)  57      No symptoms reported during testing for orthostatic hypotension.    Balance Exercises - 01/23/18 1255      OTAGO PROGRAM   Head Movements  Standing;5 reps    Neck Movements  Standing;5 reps;Other reps (comment) Needed cues for proper technique    Back Extension  Standing;5 reps    Trunk Movements  Standing;5 reps;Other reps (comment) needed cues on proper technique    Ankle Movements  Sitting;10 reps;Standing    Knee Extensor  10 reps;Weight (comment) 2#    Knee Flexor  10 reps;Weight (comment) 2#    Hip ABductor  10 reps;Weight (comment) needed cues on posture and correct form    Ankle Plantorflexors  20 reps, support    Ankle Dorsiflexors  20 reps, support needed cues on technique    Knee Bends  10 reps, support    Overall OTAGO Comments  issued the above to pt's HEP today. Min guard assist for balance. Will complete the program at next session.        PT Long Term Goals - 01/16/18 2026      PT LONG TERM GOAL #1   Title  Patient will be independent with HEP for strength and balance. She will be able to verbalize a  plan for community-based exercise upon completion of PT (Target for all LTGs 02/21/18-date extended due to lack of appt availability during one week of her course of PT)    Time  4    Period  Weeks    Status  New      PT LONG TERM GOAL #2   Title  Patient will improve gait velocity with appropriate assistive device to >=2.62 ft/sec (safe velocity for community ambulation)    Time  4    Period  Weeks    Status  New      PT LONG TERM GOAL #3   Title  Patient will improve FGA to >=19 to demonstrate improved balance and lesser fall risk.     Time  4    Period  Weeks    Status  New      PT LONG TERM GOAL #4   Title  Patient will ambulate over level indoor and outdoor surfaces modified independent x 400 ft    Time  4    Period  Weeks    Status  New         Plan - 01/23/18 1240    Clinical Impression Statement  Today's skilled session focused on establishing an OTAGO ex program to help with pts balance and fall risk and  checking orthostaics . Pt would benefit from future PT to help with gt and balance.     Rehab Potential  Good    PT Frequency  2x / week    PT Duration  4 weeks    PT Treatment/Interventions  ADLs/Self Care Home Management;DME Instruction;Gait training;Neuromuscular re-education;Balance training;Therapeutic exercise;Therapeutic activities;Functional mobility training;Stair training;Patient/family education;Passive range of motion;Vestibular    PT Next Visit Plan  Screen for vestibulardeficit (?HIT); instruct in safe use of cane; complete OTAGO exs    Consulted and Agree with Plan of Care  Patient       Patient will benefit from skilled therapeutic intervention in order to improve the following deficits and impairments:  Abnormal gait, Decreased balance, Decreased knowledge of use of DME, Decreased strength, Decreased safety awareness, Dizziness  Visit Diagnosis: Unsteadiness on feet  Dizziness and giddiness  Muscle weakness (generalized)  Difficulty in walking, not elsewhere classified     Problem List Patient Active Problem List   Diagnosis Date Noted  . Depression 10/02/2016  . Essential hypertension 10/02/2016  . History of depression   . Controlled type 2 diabetes mellitus without complication, without long-term current use of insulin (HCC)   . Closed fracture of right proximal humerus   . Proximal humerus fracture 09/28/2016  . Diabetes mellitus [facility](HCC) 01/25/2011    Kirsten Kelly, SPTA 01/23/2018, 1:38 PM  [facility] [address] [place], [place], [address] Phone: [phone]   Fax:  [phone]  Name: Kirsten Kelly MRN: [id number] Date of Birth: [birth year]  This note has been reviewed and edited by supervising CI.  Sallyanne KusterKathy Bury, PTA, [facility] [address], [address] [place], [place] [address] [phone] 01/23/18, 10:09 PM

## 2018-02-03 ENCOUNTER — Encounter: Payer: Self-pay | Admitting: Physical Therapy

## 2018-02-03 ENCOUNTER — Ambulatory Visit: Payer: Medicare Other | Admitting: Physical Therapy

## 2018-02-03 NOTE — Therapy (Signed)
[facility] [address] [place], [place], [address] Phone: [phone]   Fax:  [phone]  Patient Details  Name: Kirsten Kelly MRN: [id number] Date of Birth: 05/25/1928 Referring Provider:  No ref. provider found  Encounter Date: 02/03/2018   Received message that patient was cancelling her PT appointment for today due to soreness s/p a fall.   Called and spoke with pt and she reports she fell on Tues 7/2 and hurt her side/ribs. Due to the degree of soreness she is still experiencing, advised patient that she should see her doctor and possibly needs an xray.   Patient states she will think about it.   Scherrie NovemberLynn P Virlan Kempker 02/03/2018, 3:00 PM  [place] [facility] [address] [place], [place], [address] Phone: [phone]   Fax:  [phone]

## 2018-02-05 ENCOUNTER — Encounter: Payer: Medicare Other | Admitting: Physical Therapy

## 2018-02-10 ENCOUNTER — Ambulatory Visit: Payer: Medicare Other | Attending: Family Medicine | Admitting: Physical Therapy

## 2018-02-10 ENCOUNTER — Encounter: Payer: Self-pay | Admitting: Physical Therapy

## 2018-02-10 DIAGNOSIS — R262 Difficulty in walking, not elsewhere classified: Secondary | ICD-10-CM

## 2018-02-10 DIAGNOSIS — M6281 Muscle weakness (generalized): Secondary | ICD-10-CM | POA: Diagnosis not present

## 2018-02-10 DIAGNOSIS — R2681 Unsteadiness on feet: Secondary | ICD-10-CM

## 2018-02-10 NOTE — Therapy (Signed)
[facility] [address] [place], [place], [address] Phone: [phone]   Fax:  [phone]  Physical Therapy Treatment  Patient Details  Name: Kirsten Kelly MRN: [id number] Date of Birth: 1927-09-23 Referring Provider:  Laurann Montana, MD   Encounter Date: 02/10/2018  PT End of Session - 02/10/18 2033    Visit Number  3    Number of Visits  9    Date for PT Re-Evaluation  02/21/18    Authorization Type  Medicare    Authorization Time Period  6.20/19 to 04/16/2018    PT Start Time  0936    PT Stop Time  1017    PT Time Calculation (min)  41 min    Equipment Utilized During Treatment  --    Activity Tolerance  Patient tolerated treatment well    Behavior During Therapy  [facility] for tasks assessed/performed       Past Medical History:  Diagnosis Date  . Arthritis   . Diabetes mellitus   . Hypertension     Past Surgical History:  Procedure Laterality Date  . ABDOMINAL HYSTERECTOMY    . ORIF HUMERUS FRACTURE Right 09/28/2016   Procedure: OPEN REDUCTION INTERNAL FIXATION (ORIF) PROXIMAL HUMERUS FRACTURE;  Surgeon: Eldred Manges, MD;  [facility];  Service: Orthopedics;  Laterality: Right;    There were no vitals filed for this visit.  Subjective Assessment - 02/10/18 0940    Subjective  I forgot my cane again. Reports she turned too fast in the kitchen and fell hitting her left side on a stool. Had lots of pain but did not see the doctor ("it's too much copay"). States she had a hard time coughing    Pertinent History  PMH-depression, HTN, DM, fall Rt humerus fx 09/19/16 with ORIF; osteoporosis    Patient Stated Goals  make me walk straight    Currently in Pain?  No/denies        Treatment--educated on Otago exercises for hone strengthening and balance training                            PT Long Term Goals - 01/16/18 2026      PT LONG TERM GOAL #1   Title  Patient will be  independent with HEP for strength and balance. She will be able to verbalize a plan for community-based exercise upon completion of PT (Target for all LTGs 02/21/18-date extended due to lack of appt availability during one week of her course of PT)    Time  4    Period  Weeks    Status  New      PT LONG TERM GOAL #2   Title  Patient will improve gait velocity with appropriate assistive device to >=2.62 ft/sec (safe velocity for community ambulation)    Time  4    Period  Weeks    Status  New      PT LONG TERM GOAL #3   Title  Patient will improve FGA to >=19 to demonstrate improved balance and lesser fall risk.     Time  4    Period  Weeks    Status  New      PT LONG TERM GOAL #4   Title  Patient will ambulate over level indoor and outdoor surfaces modified independent x 400 ft    Time  4    Period  Weeks    Status  New            Plan - 02/10/18 2035    Clinical Impression Statement  Skilled session focused on updating Otago HEP (and removing exercies that are duplicates). Patient reports she has not been able to do her exercises due to Left rib pain s/p fall.     Rehab Potential  Good    PT Frequency  2x / week    PT Duration  4 weeks    PT Treatment/Interventions  ADLs/Self Care Home Management;DME Instruction;Gait training;Neuromuscular re-education;Balance training;Therapeutic exercise;Therapeutic activities;Functional mobility training;Stair training;Patient/family education;Passive range of motion;Vestibular    PT Next Visit Plan  instruct in safe use of cane; Otago exs    Consulted and Agree with Plan of Care  Patient       Patient will benefit from skilled therapeutic intervention in order to improve the following deficits and impairments:  Abnormal gait, Decreased balance, Decreased knowledge of use of DME, Decreased strength, Decreased safety awareness, Dizziness, Decreased mobility  Visit Diagnosis: Unsteadiness on feet  Muscle weakness  (generalized)  Difficulty in walking, not elsewhere classified     Problem List Patient Active Problem List   Diagnosis Date Noted  . Depression 10/02/2016  . Essential hypertension 10/02/2016  . History of depression   . Controlled type 2 diabetes mellitus without complication, without long-term current use of insulin (HCC)   . Closed fracture of right proximal humerus   . Proximal humerus fracture 09/28/2016  . Diabetes mellitus (HCC) 01/25/2011    Scherrie NovemberLynn P Naamah Boggess. PT 02/10/2018, 8:40 PM  [facility] [address] [place], [place], [address] Phone: [phone]   Fax:  [phone]  Name: Kirsten Kelly MRN: [id number] Date of Birth: 1928/04/09

## 2018-02-12 ENCOUNTER — Encounter: Payer: Self-pay | Admitting: Physical Therapy

## 2018-02-12 ENCOUNTER — Ambulatory Visit: Payer: Medicare Other | Admitting: Physical Therapy

## 2018-02-12 DIAGNOSIS — R262 Difficulty in walking, not elsewhere classified: Secondary | ICD-10-CM | POA: Diagnosis not present

## 2018-02-12 DIAGNOSIS — R2681 Unsteadiness on feet: Secondary | ICD-10-CM | POA: Diagnosis not present

## 2018-02-12 DIAGNOSIS — M6281 Muscle weakness (generalized): Secondary | ICD-10-CM

## 2018-02-12 NOTE — Therapy (Addendum)
[facility] [address] [place], [place], [address] Phone: [phone]   Fax:  [phone]  Physical Therapy Treatment  Patient Details  Name: TALETHA TWIFORD MRN: [id number] Date of Birth: [birth year] Referring Provider:  Laurann Montana, MD   Encounter Date: 02/12/2018  PT End of Session - 02/12/18 1402    Visit Number  4    Number of Visits  9    Date for PT Re-Evaluation  02/28/18   Authorization Type  Medicare    Authorization Time Period  6.20/19 to 04/16/2018    PT Start Time  1015    PT Stop Time  1059    PT Time Calculation (min)  44 min    Activity Tolerance  Patient tolerated treatment well    Behavior During Therapy  [facility] for tasks assessed/performed       Past Medical History:  Diagnosis Date  . Arthritis   . Diabetes mellitus   . Hypertension     Past Surgical History:  Procedure Laterality Date  . ABDOMINAL HYSTERECTOMY    . ORIF HUMERUS FRACTURE Right 09/28/2016   Procedure: OPEN REDUCTION INTERNAL FIXATION (ORIF) PROXIMAL HUMERUS FRACTURE;  Surgeon: Eldred Manges, MD;  Location: [facility] OR;  Service: Orthopedics;  Laterality: Right;    There were no vitals filed for this visit.  Subjective Assessment - 02/12/18 1017    Subjective  I tried some exercises yesterday and they went fine. Thinks she has a SPC at home and will look for it.     Pertinent History  PMH-depression, HTN, DM, fall Rt humerus fx 09/19/16 with ORIF; osteoporosis    Patient Stated Goals  make me walk straight    Currently in Pain?  No/denies                       [facility] Adult PT Treatment/Exercise - 02/12/18 1057      Transfers   Transfers  Sit to Stand;Stand to Sit    Sit to Stand  5: Supervision standing on blue airex foam    Stand to Sit  5: Supervision    Stand to Sit Details  vc for technique to control descent and strengthen her legs      Ambulation/Gait   Ambulation/Gait Assistance  5: Supervision    Ambulation/Gait Assistance Details  with her [facility] she brushed her left foot against cane twice in 100 ft and once caught the cane on a piece of furniturre as she walked past it; none of these episodes wthen using SPC reg tip vs quad tip    Ambulation Distance (Feet)  80 Feet 120, 80, 80    Assistive device  Straight cane;Small based quad cane SPC reg tip vs quad tip    Gait Pattern  Step-through pattern;Decreased arm swing - right;Decreased arm swing - left;Decreased stride length;Decreased trunk rotation;Wide base of support      Exercises   Exercises  Knee/Hip      Knee/Hip Exercises: Aerobic   Nustep  3 minutes L3 steps/minute 40-70      Knee/Hip Exercises: Seated   Sit to Sand  1 set;10 reps          Balance Exercises - 02/12/18 1353      Balance Exercises: Standing   Standing Eyes Opened  Wide (BOA);Foam/compliant surface      OTAGO PROGRAM   Neck Movements  Sitting;5 reps max cues (demonstration, verbal, tactile)    Back Extension  Standing  back to  lower counter    Trunk Movements  Standing;5 reps    Hip ABductor  10 reps    Backwards Walking  Support    Walking and Turning Around  Assistive device    Sideways Walking  Assistive device    Tandem Stance  10 seconds, support    Tandem Walk  Support    One Leg Stand  10 seconds, support    Heel Walking  Support    Toe Walk  Support    Sit to Stand  10 reps, no support        PT Education - 02/12/18 1401    Education Details  remainder of Otago exercises    Person(s) Educated  Patient    Methods  Explanation;Demonstration;Verbal cues;Handout    Comprehension  Verbalized understanding;Returned demonstration;Verbal cues required;Need further instruction          PT Long Term Goals - 01/16/18 2026      PT LONG TERM GOAL #1   Title  Patient will be independent with HEP for strength and balance. She will be able to verbalize a plan for community-based exercise upon completion of PT (Target for all LTGs  02/28/18-date extended due to lack of appt availability during one week of her course of PT, plus one entire week missed due to fall)    Time  4    Period  Weeks    Status  New      PT LONG TERM GOAL #2   Title  Patient will improve gait velocity with appropriate assistive device to >=2.62 ft/sec (safe velocity for community ambulation)    Time  4    Period  Weeks    Status  New      PT LONG TERM GOAL #3   Title  Patient will improve FGA to >=19 to demonstrate improved balance and lesser fall risk.     Time  4    Period  Weeks    Status  New      PT LONG TERM GOAL #4   Title  Patient will ambulate over level indoor and outdoor surfaces modified independent x 400 ft    Time  4    Period  Weeks    Status  New            Plan - 02/12/18 1403    Clinical Impression Statement  [facility] frequency (3x/week). Patient remembered her cane today! Assessed with a variety of canes with pt agreeing she did better with SPC (she preferred rubber quad tip). Futher exercises for LE strengthening. Discussed with pt adding one more week of therapy due to the week she missed after her fall. She seemed eager to do this as she can see she is making progress.     Rehab Potential  Good    PT Frequency  2x / week    PT Duration  4 weeks    PT Treatment/Interventions  ADLs/Self Care Home Management;DME Instruction;Gait training;Neuromuscular re-education;Balance training;Therapeutic exercise;Therapeutic activities;Functional mobility training;Stair training;Patient/family education;Passive range of motion;Vestibular    PT Next Visit Plan  ?found her SPC at home; ?going to order a quad rubber tip;  balance training on compliant surface, stepping strategies, gait with head turns, turns, outdoors level surfaces x 400 ft    Consulted and Agree with Plan of Care  Patient       Patient will benefit from skilled therapeutic intervention in order to improve the following deficits and  impairments:  Abnormal gait, Decreased balance, Decreased knowledge of use of DME, Decreased strength, Decreased safety awareness, Dizziness, Decreased mobility  Visit Diagnosis: Unsteadiness on feet  Muscle weakness (generalized)  Difficulty in walking, not elsewhere classified     Problem List Patient Active Problem List   Diagnosis Date Noted  . Depression 10/02/2016  . Essential hypertension 10/02/2016  . History of depression   . Controlled type 2 diabetes mellitus without complication, without long-term current use of insulin (HCC)   . Closed fracture of right proximal humerus   . Proximal humerus fracture 09/28/2016  . Diabetes mellitus (HCC) 01/25/2011    Zena Amos, PT 02/12/2018, 2:12 PM  [facility] [address] [place], [place], [address] Phone: [phone]   Fax:  [phone]  Name: KEIRA BOHLIN MRN: [id number] Date of Birth: 14-Mar-1928

## 2018-02-17 ENCOUNTER — Ambulatory Visit: Payer: Medicare Other | Admitting: Physical Therapy

## 2018-02-17 DIAGNOSIS — R2681 Unsteadiness on feet: Secondary | ICD-10-CM | POA: Diagnosis not present

## 2018-02-17 DIAGNOSIS — R262 Difficulty in walking, not elsewhere classified: Secondary | ICD-10-CM

## 2018-02-17 DIAGNOSIS — M6281 Muscle weakness (generalized): Secondary | ICD-10-CM | POA: Diagnosis not present

## 2018-02-17 NOTE — Therapy (Signed)
[facility] [address] [place], [place], [address] Phone: [phone]   Fax:  [phone]  Physical Therapy Treatment  Patient Details  Name: Kirsten Kelly MRN: [id number] Date of Birth: [birth year] Referring Provider:  Laurann Montana, MD   Encounter Date: 02/17/2018  PT End of Session - 02/17/18 1041    Visit Number  5    Number of Visits  9    Date for PT Re-Evaluation  02/28/18 updated due to missed 1 week entirely    Authorization Type  Medicare    Authorization Time Period  6.20/19 to 04/16/2018    PT Start Time  0930    PT Stop Time  1012    PT Time Calculation (min)  42 min    Equipment Utilized During Treatment  Gait belt    Activity Tolerance  Patient tolerated treatment well    Behavior During Therapy  [facility] for tasks assessed/performed       Past Medical History:  Diagnosis Date  . Arthritis   . Diabetes mellitus   . Hypertension     Past Surgical History:  Procedure Laterality Date  . ABDOMINAL HYSTERECTOMY    . ORIF HUMERUS FRACTURE Right 09/28/2016   Procedure: OPEN REDUCTION INTERNAL FIXATION (ORIF) PROXIMAL HUMERUS FRACTURE;  Surgeon: Eldred Manges, MD;  Location: [facility] OR;  Service: Orthopedics;  Laterality: Right;    There were no vitals filed for this visit.  Subjective Assessment - 02/17/18 0929    Subjective  Doing home exercises. No falls to report or pain today. Brought SPC from home today.    Pertinent History  PMH-depression, HTN, DM, fall Rt humerus fx 09/19/16 with ORIF; osteoporosis    Patient Stated Goals  make me walk straight    Currently in Pain?  No/denies           [facility] Adult PT Treatment/Exercise - 02/17/18 1047      Ambulation/Gait   Ambulation/Gait  Yes    Ambulation/Gait Assistance  4: Min assist    Ambulation Distance (Feet)  283 Feet + around gym for other activities    Assistive device  Straight cane    Gait Pattern  Step-through pattern;Decreased arm swing -  right;Decreased arm swing - left;Decreased stride length;Decreased trunk rotation;Wide base of support    Ambulation Surface  Unlevel;Outdoor;Paved;Level;Indoor    Gait Comments  In hallway pt intructed in dynamic gt activities including gt with head turns and nods. Pt needing reminders to maintain head position while ambulating. Min assist to prevent LOB with head turns and verbal cues on posture. SPTA noted postual sway when pt performed head nods.       Neuro Re-ed    Neuro Re-ed Details   In corner with chair for safety on 1" thick compliant surface with feet apart then feet together EO and EC with head turns, nods and diagonals x 10 reps x 2 sets each. no UE support with EO, intermittent UE support with EC and min guard to prevent LOB.  On rocker board fwd/bwd then side/side EO then progressing EC for last set. Intermittent UE support and min assist with verbal cues on posture. 10 reps and 3 sets each. At counter top working on stepping strategy pt instructed in four square stepping drills counter-clockwise/ clockwise x 3, requring min guard assist to maintain balance pt needing no UE support. Progressing to fwd/bwd/side stepping on and off compliant surface x 10 reps each, intermittent UE support on counter top and  verbal cues for upright posture. Working on SLS with chair behind for safety lightly tapping small compliant targets in front x 10 reps x 1 set. No UE support needed, required min assist to maintain balance and Verbal cues to increase stance time on LEs.           PT Long Term Goals - 02/17/18 [id number]      PT LONG TERM GOAL #1   Title  Patient will be independent with HEP for strength and balance. She will be able to verbalize a plan for community-based exercise upon completion of PT (Target for all LTGs 02/28/18-date extended due to lack of appt availability during one week of her course of PT, plus missed one entire week after fall    Time  4    Period  Weeks    Status  New      PT  LONG TERM GOAL #2   Title  Patient will improve gait velocity with appropriate assistive device to >=2.62 ft/sec (safe velocity for community ambulation)    Time  4    Period  Weeks    Status  New      PT LONG TERM GOAL #3   Title  Patient will improve FGA to >=19 to demonstrate improved balance and lesser fall risk.     Time  4    Period  Weeks    Status  New      PT LONG TERM GOAL #4   Title  Patient will ambulate over level indoor and outdoor surfaces modified independent x 400 ft    Time  4    Period  Weeks    Status  New            Plan - 02/17/18 1042    Clinical Impression Statement  Today's skilled session focused on challenging patients balance on compliant surfaces, stepping strategy, dynamic gait and outdoor unlevel surfaces. Pt did well this treatment and would benefit from further PT in order to progress toward meeting unmt goals.    Rehab Potential  Good    PT Frequency  2x / week    PT Duration  4 weeks    PT Treatment/Interventions  ADLs/Self Care Home Management;DME Instruction;Gait training;Neuromuscular re-education;Balance training;Therapeutic exercise;Therapeutic activities;Functional mobility training;Stair training;Patient/family education;Passive range of motion;Vestibular    PT Next Visit Plan  continue balance training on compliant surface, stepping strategies, dynamic gait, outdoors level surfaces x 400 ft    Consulted and Agree with Plan of Care  Patient       Patient will benefit from skilled therapeutic intervention in order to improve the following deficits and impairments:  Abnormal gait, Decreased balance, Decreased knowledge of use of DME, Decreased strength, Decreased safety awareness, Dizziness, Decreased mobility  Visit Diagnosis: Unsteadiness on feet  Muscle weakness (generalized)  Difficulty in walking, not elsewhere classified     Problem List Patient Active Problem List   Diagnosis Date Noted  . Depression 10/02/2016  .  Essential hypertension 10/02/2016  . History of depression   . Controlled type 2 diabetes mellitus without complication, without long-term current use of insulin (HCC)   . Closed fracture of right proximal humerus   . Proximal humerus fracture 09/28/2016  . Diabetes mellitus [facility](HCC) 01/25/2011   Ellin SabaShelby Mitsy Owen, SPTA 02/17/2018, 11:15 AM  [facility] [address] [place], [place], [address] Phone: [phone]   Fax:  [phone]  Name: Kirsten Kelly MRN: [id number] Date of Birth: 05-23-1928

## 2018-02-19 ENCOUNTER — Encounter: Payer: Self-pay | Admitting: Physical Therapy

## 2018-02-19 ENCOUNTER — Ambulatory Visit: Payer: Medicare Other | Admitting: Physical Therapy

## 2018-02-19 DIAGNOSIS — M6281 Muscle weakness (generalized): Secondary | ICD-10-CM | POA: Diagnosis not present

## 2018-02-19 DIAGNOSIS — R262 Difficulty in walking, not elsewhere classified: Secondary | ICD-10-CM | POA: Diagnosis not present

## 2018-02-19 DIAGNOSIS — R2681 Unsteadiness on feet: Secondary | ICD-10-CM | POA: Diagnosis not present

## 2018-02-19 NOTE — Therapy (Signed)
[facility] [address] [place], , [address] Phone: [phone]   Fax:  [phone]  Physical Therapy Treatment  Patient Details  Name: Kirsten Kelly MRN: [id number] Date of Birth: 10/10/1927 Referring Provider:  White, Cynthia, MD   Encounter Date: 02/19/2018  PT End of Session - 02/19/18 1621    Visit Number  6    Number of Visits  9    Date for PT Re-Evaluation  02/28/18 updated due to missed 1 week entirely    Authorization Type  Medicare    Authorization Time Period  6.20/19 to 04/16/2018    PT Start Time  0940    PT Stop Time  1020    PT Time Calculation (min)  40 min    Equipment Utilized During Treatment  Gait belt    Activity Tolerance  Patient tolerated treatment well    Behavior During Therapy  W[facility] for tasks assessed/performed       Past Medical History:  Diagnosis Date  . Arthritis   . Diabetes mellitus   . Hypertension     Past Surgical History:  Procedure Laterality Date  . ABDOMINAL HYSTERECTOMY    . ORIF HUMERUS FRACTURE Right 09/28/2016   Procedure: OPEN REDUCTION INTERNAL FIXATION (ORIF) PROXIMAL HUMERUS FRACTURE;  Surgeon: Mark C Yates, MD;  [facility]: [facility] OR;  Service: Orthopedics;  Laterality: Right;    There were no vitals filed for this visit.  Subjective Assessment - 02/19/18 0946    Subjective  Using the cane "when I remember" and feels confident with it.   (Pended)     Pertinent History  PMH-depression, HTN, DM, fall Rt humerus fx 09/19/16 with ORIF; osteoporosis  (Pended)     Patient Stated Goals  make me walk straight  (Pended)     Currently in Pain?  No/denies  (Pended)                        [facility] Adult PT Treatment/Exercise - 02/19/18 1628      Ambulation/Gait   Ambulation/Gait Assistance  6: Modified independent (Device/Increase time)    Ambulation Distance (Feet)  400 Feet 100, 100    Assistive device  Straight cane    Gait Pattern  Step-through  pattern;Decreased stride length;Decreased trunk rotation;Wide base of support    Ambulation Surface  Level;Unlevel;Indoor;Outdoor;Paved;Other (comment) pinestraw (grass too wet)    Curb  6: Modified independent (Device/increase time) 4 reps varying sequencing with cane and which hand          Balance Exercises - 02/19/18 1630      Balance Exercises: Standing   Tandem Stance  Eyes open;Foam/compliant surface;Intermittent upper extremity support    SLS  Eyes open;Foam/compliant surface;Intermittent upper extremity support    Balance Beam  blue; both feet on beam crosswise with sit to stand with up to mod assist for initial lift off and balance x 5; doubled height of blue beam, one foot on beam closest to pt and one foot on floor (staggered forward) with sit to stand x 5 each leg in back    Gait with Head Turns  Forward;Retro;Foam/compliant surface;Intermittent upper extremity support    Tandem Gait  Forward;Intermittent upper extremity support;Foam/compliant surface    Retro Gait  Foam/compliant surface    Sidestepping  Foam/compliant support;Upper extremity support    Marching Limitations  slow march on blue mat internittent UE on counter        PT Education - 02/19/18   1621    Education Details  plan to do Otago exercises minimum of 3x/week; broaden BOS by placing cane slightly further out to the side when on unlevel ground    Person(s) Educated  Patient    Methods  Explanation    Comprehension  Verbalized understanding          PT Long Term Goals - 02/19/18 1627      PT LONG TERM GOAL #1   Title  Patient will be independent with HEP for strength and balance. She will be able to verbalize a plan for community-based exercise upon completion of PT (Target for all LTGs 02/28/18-date extended due to lack of appt availability during one week of her course of PT, plus missed one entire week after fall    Time  4    Period  Weeks    Status  New      PT LONG TERM GOAL #2   Title   Patient will improve gait velocity with appropriate assistive device to >=2.62 ft/sec (safe velocity for community ambulation)    Time  4    Period  Weeks    Status  New      PT LONG TERM GOAL #3   Title  Patient will improve FGA to >=19 to demonstrate improved balance and lesser fall risk.     Time  4    Period  Weeks    Status  New      PT LONG TERM GOAL #4   Title  Patient will ambulate over level indoor and outdoor surfaces modified independent x 400 ft    Baseline  7/24 met    Time  4    Period  Weeks    Status  Achieved            Plan - 02/19/18 1622    Clinical Impression Statement  Session focused on gait training with SPC outside over paved level and unlevel surfaces and on pinestraw covered unlevel area. Further trained on compliant surfaces (blue mat vs blue beam) for balance training with pt needing infrequent min assist for LOB (on challenging surface/task). Only one LOB noted walking outdoors with pt recovering modified independently with one side step. Discussed plan for remaining 2 appts and will check goals next visit with potential for d/c and cancel last scheduled appt.     Rehab Potential  Good    PT Frequency  2x / week    PT Duration  4 weeks    PT Treatment/Interventions  ADLs/Self Care Home Management;DME Instruction;Gait training;Neuromuscular re-education;Balance training;Therapeutic exercise;Therapeutic activities;Functional mobility training;Stair training;Patient/family education;Passive range of motion;Vestibular    PT Next Visit Plan  check LTGs and ?d/c (if so cancel final appt) vs come one more time to "wrap up any loose ends"    Consulted and Agree with Plan of Care  Patient       Patient will benefit from skilled therapeutic intervention in order to improve the following deficits and impairments:  Abnormal gait, Decreased balance, Decreased knowledge of use of DME, Decreased strength, Decreased safety awareness, Dizziness, Decreased  mobility  Visit Diagnosis: Unsteadiness on feet  Difficulty in walking, not elsewhere classified     Problem List Patient Active Problem List   Diagnosis Date Noted  . Depression 10/02/2016  . Essential hypertension 10/02/2016  . History of depression   . Controlled type 2 diabetes mellitus without complication, without long-term current use of insulin (HCC)   . Closed fracture of right proximal humerus   .   Proximal humerus fracture 09/28/2016  . Diabetes mellitus (HCC) 01/25/2011     P , PT 02/19/2018, 4:33 PM  [facility] [address] [place], [place], [address] Phone: [phone]   Fax:  [phone]  Name: Kirsten Kelly MRN: [id number] Date of Birth: 01/26/1928   

## 2018-02-24 ENCOUNTER — Ambulatory Visit: Payer: Medicare Other | Admitting: Physical Therapy

## 2018-02-24 ENCOUNTER — Encounter: Payer: Self-pay | Admitting: Physical Therapy

## 2018-02-24 DIAGNOSIS — M6281 Muscle weakness (generalized): Secondary | ICD-10-CM | POA: Diagnosis not present

## 2018-02-24 DIAGNOSIS — R262 Difficulty in walking, not elsewhere classified: Secondary | ICD-10-CM | POA: Diagnosis not present

## 2018-02-24 DIAGNOSIS — R2681 Unsteadiness on feet: Secondary | ICD-10-CM

## 2018-02-24 NOTE — Therapy (Signed)
[facility] [address] [place], [place], [address] Phone: [phone]   Fax:  [phone]  Physical Therapy Treatment and Discharge Summary  Patient Details  Name: Kirsten Kelly MRN: [id number] Date of Birth: 07-Mar-1928 Referring Provider:  Harlan Stains, MD   Encounter Date: 02/24/2018  PT End of Session - 02/24/18 1446    Visit Number  7    Number of Visits  9    Date for PT Re-Evaluation  02/28/18 updated due to missed 1 week entirely    Authorization Type  Medicare    Authorization Time Period  6.20/19 to 04/16/2018    PT Start Time  1015    PT Stop Time  1055    PT Time Calculation (min)  40 min    Activity Tolerance  Patient tolerated treatment well    Behavior During Therapy  [facility] for tasks assessed/performed       Past Medical History:  Diagnosis Date  . Arthritis   . Diabetes mellitus   . Hypertension     Past Surgical History:  Procedure Laterality Date  . ABDOMINAL HYSTERECTOMY    . ORIF HUMERUS FRACTURE Right 09/28/2016   Procedure: OPEN REDUCTION INTERNAL FIXATION (ORIF) PROXIMAL HUMERUS FRACTURE;  Surgeon: Marybelle Killings, MD;  Location: [facility];  Service: Orthopedics;  Laterality: Right;    There were no vitals filed for this visit.  Subjective Assessment - 02/24/18 1018    Subjective  I've been remembering to use the cane more. Reports she has been doing her exercises. States she does feel her balance has improved    Pertinent History  PMH-depression, HTN, DM, fall Rt humerus fx 09/19/16 with ORIF; osteoporosis    Patient Stated Goals  make me walk straight    Currently in Pain?  Yes    Pain Location  Neck    Pain Orientation  Left;Posterior    Pain Descriptors / Indicators  Aching    Pain Type  Acute pain    Pain Onset  Today    Pain Frequency  Intermittent    Aggravating Factors   fell asleep with the fan blowing on her neck    Pain Relieving Factors  getting moving         [facility] PT  Assessment - 02/24/18 1033      Ambulation/Gait   Gait velocity  32.8/15.97=2.05 with cane 32.8/14.03=2.34      Functional Gait  Assessment   Gait Level Surface  Walks 20 ft, slow speed, abnormal gait pattern, evidence for imbalance or deviates 10-15 in outside of the 12 in walkway width. Requires more than 7 sec to ambulate 20 ft. 9.16 sec    Change in Gait Speed  Able to smoothly change walking speed without loss of balance or gait deviation. Deviate no more than 6 in outside of the 12 in walkway width.    Gait with Horizontal Head Turns  Performs head turns smoothly with slight change in gait velocity (eg, minor disruption to smooth gait path), deviates 6-10 in outside 12 in walkway width, or uses an assistive device.    Gait with Vertical Head Turns  Performs task with slight change in gait velocity (eg, minor disruption to smooth gait path), deviates 6 - 10 in outside 12 in walkway width or uses assistive device    Gait and Pivot Turn  Pivot turns safely within 3 sec and stops quickly with no loss of balance.    Step Over Obstacle  Is able to step over one shoe box (4.5 in total height) without changing gait speed. No evidence of imbalance.    Gait with Narrow Base of Support  Ambulates less than 4 steps heel to toe or cannot perform without assistance. 3 steps    Gait with Eyes Closed  Walks 20 ft, no assistive devices, good speed, no evidence of imbalance, normal gait pattern, deviates no more than 6 in outside 12 in walkway width. Ambulates 20 ft in less than 7 sec.    Ambulating Backwards  Walks 20 ft, uses assistive device, slower speed, mild gait deviations, deviates 6-10 in outside 12 in walkway width.    Steps  Alternating feet, must use rail.    Total Score  20                   [facility] Adult PT Treatment/Exercise - 02/24/18 1033      Transfers   Transfers  Sit to Stand;Stand to Sit    Sit to Stand  5: Supervision;6: Modified independent (Device/Increase time);With  upper extremity assist;Without upper extremity assist;From bed      Ambulation/Gait   Ambulation/Gait Assistance  6: Modified independent (Device/Increase time)    Ambulation/Gait Assistance Details  with [facility]    Ambulation Distance (Feet)  240 Feet    Assistive device  Straight cane    Gait Pattern  Step-through pattern;Decreased stride length;Decreased trunk rotation;Wide base of support    Ambulation Surface  Indoor    Curb  4: Min assist;6: Modified independent (Device/increase time)    Gait Comments  initial time down the step she did not place cane down first with imbalance and min assist provided; she was able to verbalize what she should have done and repeated x 2 with no imbalnce          Balance Exercises - 02/24/18 1442      Balance Exercises: Standing   SLS  Eyes open;Intermittent upper extremity support;3 reps;10 secs;20 secs    Stepping Strategy  Posterior;5 reps    Rockerboard  Anterior/posterior;EO;Intermittent UE support anterior and posterior hip strategy; step off/on ant/post    Gait with Head Turns  Forward;Retro    Tandem Gait  Forward;Upper extremity support    Sidestepping  4 reps        PT Education - 02/24/18 1444    Education Details  results of LTG assessment; balance classes available at [facility] and encouraged her return to working out at [employer]) Educated  Patient    Methods  Explanation;Handout    Comprehension  Verbalized understanding          PT Long Term Goals - 02/24/18 1020      PT LONG TERM GOAL #1   Title  Patient will be independent with HEP for strength and balance. She will be able to verbalize a plan for community-based exercise upon completion of PT ([facility] for all LTGs 02/28/18-date extended due to lack of appt availability during one week of her course of PT, plus missed one entire week after fall    Baseline  02/24/18 Plans to return to [facility]    Time  4    Period  Weeks    Status  Achieved       PT LONG TERM GOAL #2   Title  Patient will improve gait velocity with appropriate assistive device to >=2.62 ft/sec (safe velocity for community ambulation)    Baseline  7/29 with  cane (recently began using 2.05 ft/sec); no device 2.34 ft/sec (tested on eval without device)    Time  4    Period  Weeks    Status  Partially Met      PT LONG TERM GOAL #3   Title  Patient will improve FGA to >=19 to demonstrate improved balance and lesser fall risk.     Baseline  7/29  FGA 20    Time  4    Period  Weeks    Status  Achieved      PT LONG TERM GOAL #4   Title  Patient will ambulate over level indoor and outdoor surfaces modified independent x 400 ft    Baseline  7/24 met    Time  4    Period  Weeks    Status  Achieved            Plan - 02/24/18 1449    Clinical Impression Statement  LTGs assessed with patient meeting 3 of 4 goals and partially meeting 1 of 4 (made progress but not to goal level). Patient reports no problems or questions with HEP. She states she feels her balance has improved and understands she is to continue to use her cane based on today's measures. Patient discharged from PT.     Rehab Potential  Good    PT Frequency  2x / week    PT Duration  4 weeks    PT Treatment/Interventions  ADLs/Self Care Home Management;DME Instruction;Gait training;Neuromuscular re-education;Balance training;Therapeutic exercise;Therapeutic activities;Functional mobility training;Stair training;Patient/family education;Passive range of motion;Vestibular    PT Next Visit Plan  --    Consulted and Agree with Plan of Care  Patient       Patient will benefit from skilled therapeutic intervention in order to improve the following deficits and impairments:  Abnormal gait, Decreased balance, Decreased knowledge of use of DME, Decreased strength, Decreased safety awareness, Dizziness, Decreased mobility  Visit Diagnosis: Unsteadiness on feet     Problem List Patient Active Problem  List   Diagnosis Date Noted  . Depression 10/02/2016  . Essential hypertension 10/02/2016  . History of depression   . Controlled type 2 diabetes mellitus without complication, without long-term current use of insulin ([place])   . Closed fracture of right proximal humerus   . Proximal humerus fracture 09/28/2016  . Diabetes mellitus ([place]) 01/25/2011    Jeanie Cooks Dameian Crisman. PT 02/24/2018, 2:53 PM  [facility] [address] [place], [place], [address] Phone: [phone]   Fax:  [phone]  Name: Kirsten Kelly MRN: [id number] Date of Birth: 1927-08-10

## 2018-02-26 ENCOUNTER — Ambulatory Visit: Payer: Medicare Other | Admitting: Podiatry

## 2018-02-28 ENCOUNTER — Ambulatory Visit: Payer: Medicare Other | Admitting: Physical Therapy

## 2018-03-04 ENCOUNTER — Ambulatory Visit (INDEPENDENT_AMBULATORY_CARE_PROVIDER_SITE_OTHER): Payer: Medicare Other | Admitting: Podiatry

## 2018-03-04 ENCOUNTER — Encounter: Payer: Self-pay | Admitting: Podiatry

## 2018-03-04 DIAGNOSIS — M2010 Hallux valgus (acquired), unspecified foot: Secondary | ICD-10-CM

## 2018-03-04 DIAGNOSIS — E119 Type 2 diabetes mellitus without complications: Secondary | ICD-10-CM

## 2018-03-04 DIAGNOSIS — M79609 Pain in unspecified limb: Secondary | ICD-10-CM | POA: Diagnosis not present

## 2018-03-04 DIAGNOSIS — B351 Tinea unguium: Secondary | ICD-10-CM

## 2018-03-04 NOTE — Progress Notes (Signed)
Patient ID: Kirsten Kelly, female   DOB: [birth year], 84 y.o.   MRN: [id number] Complaint:  Visit Type: Patient returns to my office for continued preventative foot care services. Complaint: Patient states" my nails have grown long and thick and become painful to walk and wear shoes" Patient has been diagnosed with DM with no foot complications. The patient presents for preventative foot care services. No changes to ROS  Podiatric Exam: Vascular: dorsalis pedis and posterior tibial pulses are palpable bilateral. Capillary return is immediate. Temperature gradient is WNL. Skin turgor WNL  Sensorium: Diminished  Semmes Weinstein monofilament test. Normal tactile sensation bilaterally. Nail Exam: Pt has thick disfigured discolored nails with subungual debris noted bilateral entire nail hallux through fifth toenails Ulcer Exam: There is no evidence of ulcer or pre-ulcerative changes or infection. Orthopedic Exam: Muscle tone and strength are WNL. No limitations in general ROM. No crepitus or effusions noted. Foot type and digits show no abnormalities. HAV  B/L Skin: No Porokeratosis. No infection or ulcers  Diagnosis:  Onychomycosis, , Pain in right toe, pain in left toes  Treatment & Plan Procedures and Treatment: Consent by patient was obtained for treatment procedures. The patient understood the discussion of treatment and procedures well. All questions were answered thoroughly reviewed. Debridement of mycotic and hypertrophic toenails, 1 through 5 bilateral and clearing of subungual debris. No ulceration, no infection noted.ABN signed for 2019.   Return Visit-Office Procedure: Patient instructed to return to the office for a follow up visit 3 months for continued evaluation and treatment.    Helane GuntherGregory Kairav Russomanno DPM

## 2018-04-03 DIAGNOSIS — H902 Conductive hearing loss, unspecified: Secondary | ICD-10-CM | POA: Diagnosis not present

## 2018-04-03 DIAGNOSIS — H6521 Chronic serous otitis media, right ear: Secondary | ICD-10-CM | POA: Diagnosis not present

## 2018-04-03 DIAGNOSIS — H903 Sensorineural hearing loss, bilateral: Secondary | ICD-10-CM | POA: Diagnosis not present

## 2018-04-03 DIAGNOSIS — H906 Mixed conductive and sensorineural hearing loss, bilateral: Secondary | ICD-10-CM | POA: Diagnosis not present

## 2018-05-21 DIAGNOSIS — H5213 Myopia, bilateral: Secondary | ICD-10-CM | POA: Diagnosis not present

## 2018-05-21 DIAGNOSIS — H52223 Regular astigmatism, bilateral: Secondary | ICD-10-CM | POA: Diagnosis not present

## 2018-05-21 DIAGNOSIS — E119 Type 2 diabetes mellitus without complications: Secondary | ICD-10-CM | POA: Diagnosis not present

## 2018-05-21 DIAGNOSIS — Z961 Presence of intraocular lens: Secondary | ICD-10-CM | POA: Diagnosis not present

## 2018-05-31 IMAGING — CT CT HEAD W/O CM
3 of 8 series · 13 of 47 positions shown, 15 images · non-contrast
Comparison: None.

CLINICAL DATA: Pain after falling out of bed.  Initial encounter.

EXAM:
CT HEAD WITHOUT CONTRAST
CT CERVICAL SPINE WITHOUT CONTRAST
TECHNIQUE: Multidetector CT imaging of the head and cervical spine was
performed following the standard protocol without intravenous
contrast. Multiplanar CT image reconstructions of the cervical spine
were also generated.

[Series 6: coronal · coronal · 0.31mm/px · 3 of 79 slices shown]
[im 5/79  brain]
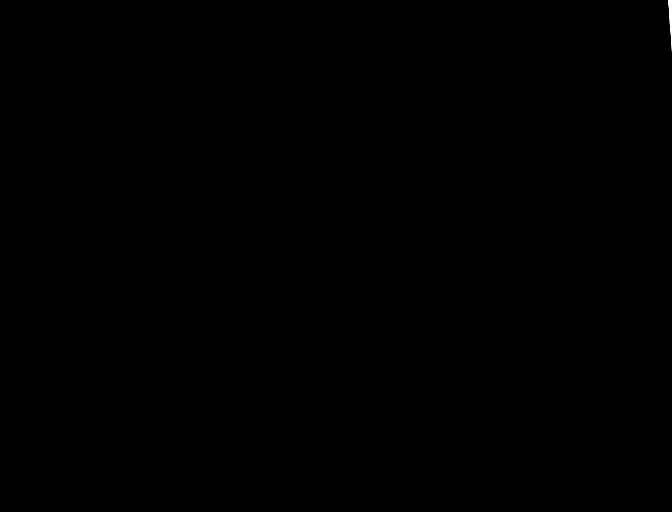
[im 9/79  brain]
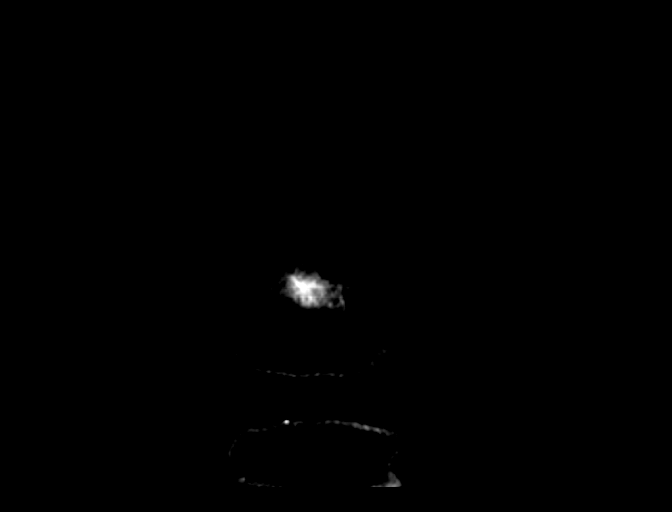
[im 13/79  brain]
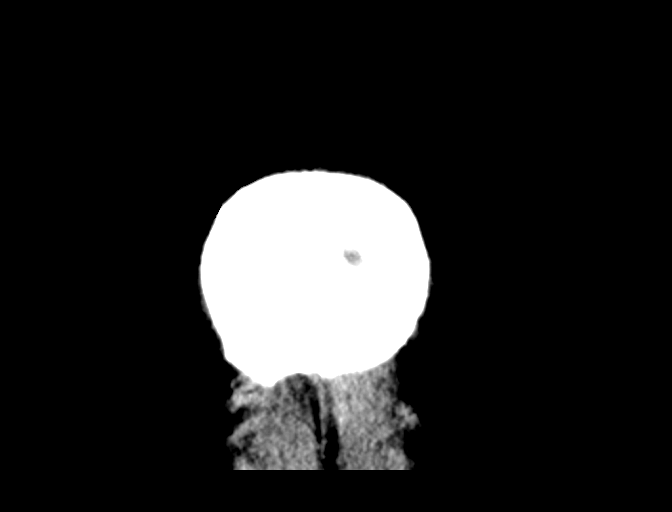

[Series 7: sagittal · sagittal · 0.30mm/px · 2 of 73 slices shown]
[im 25/73  brain]
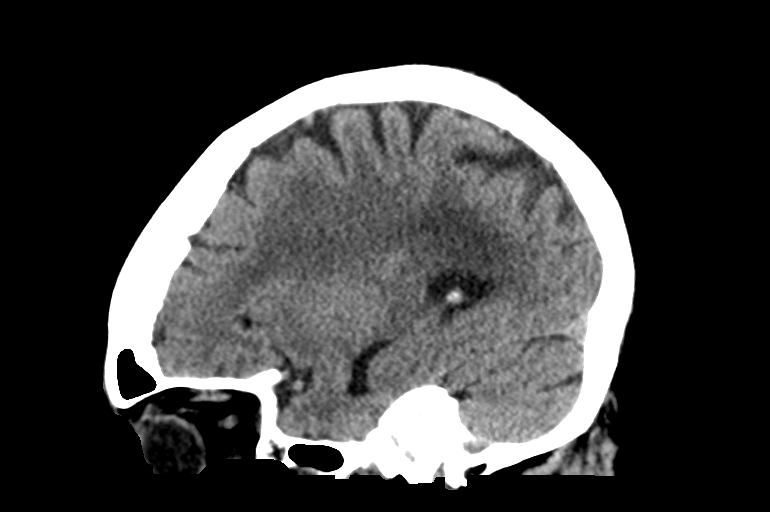
[im 49/73  brain]
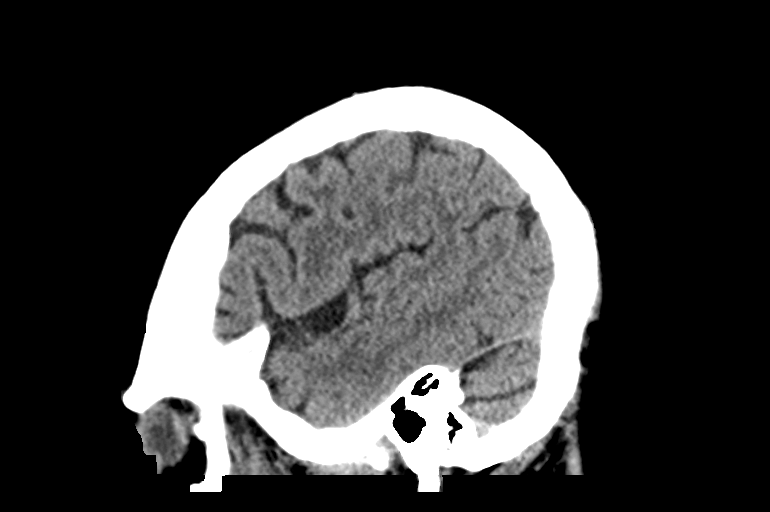

[Series 11: axial recon · axial · 0.23mm/px · z∈[-325,-142]mm · 8 of 119 slices shown, 10 images]
[im 11/119  brain]
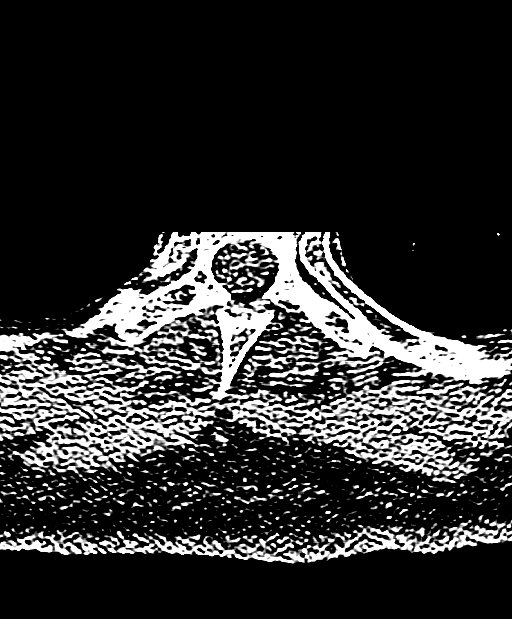
[im 11/119  bone]
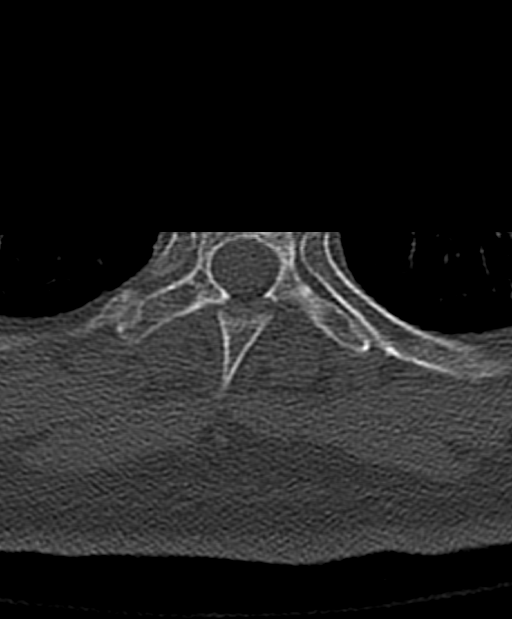
[im 22/119  brain]
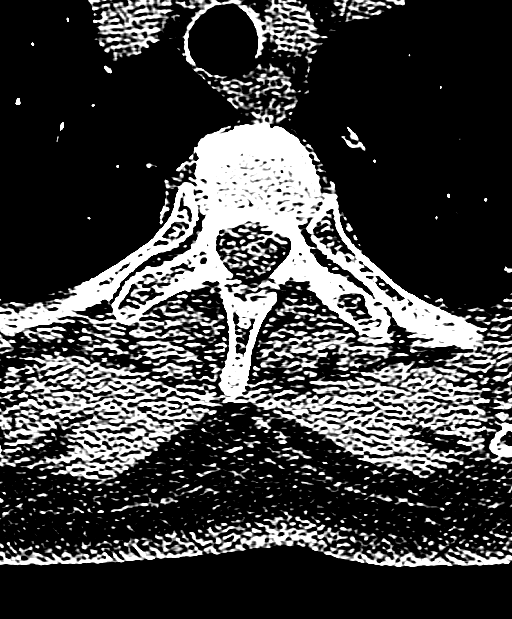
[im 43/119  brain]
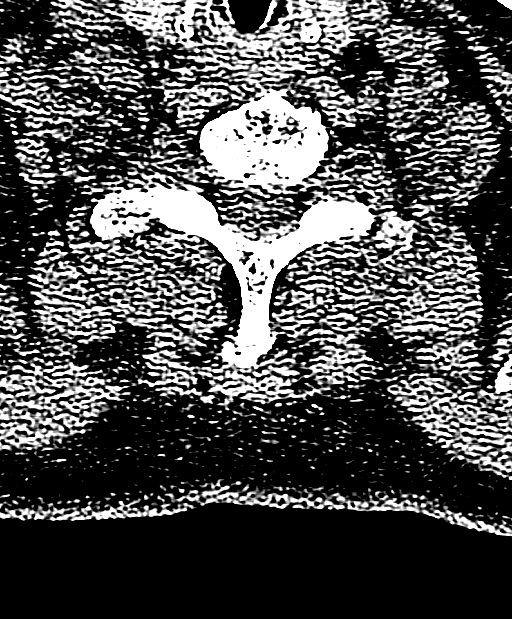
[im 54/119  brain]
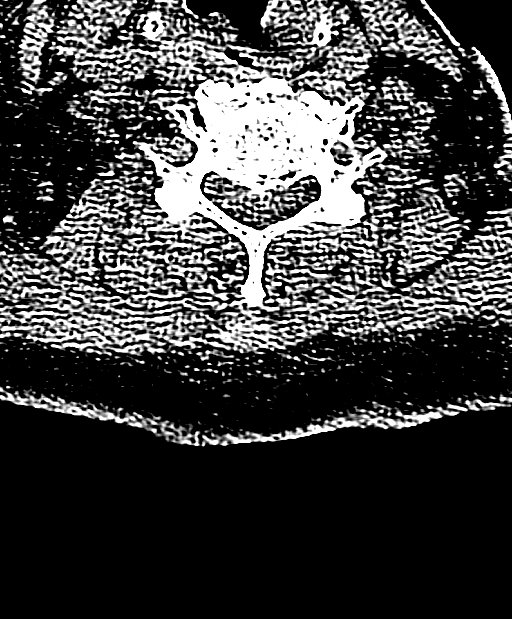
[im 65/119  brain]
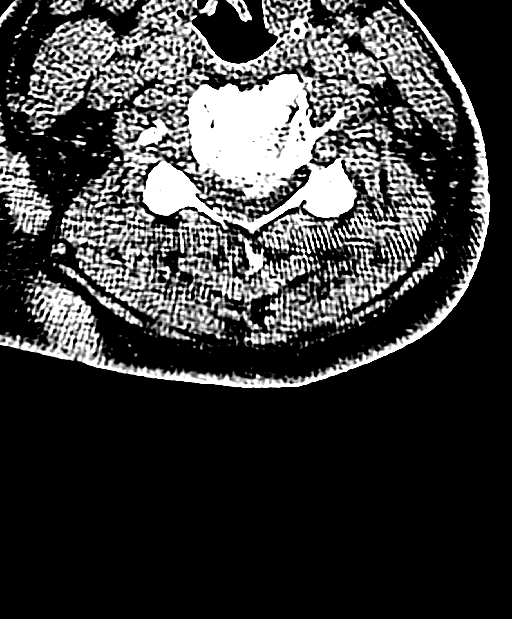
[im 65/119  bone]
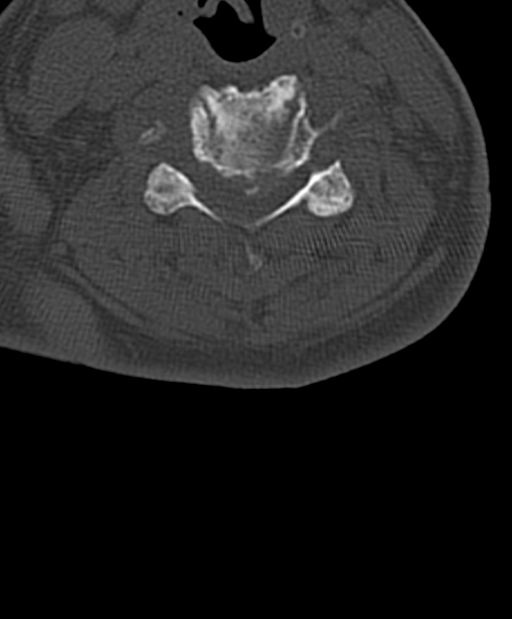
[im 76/119  brain]
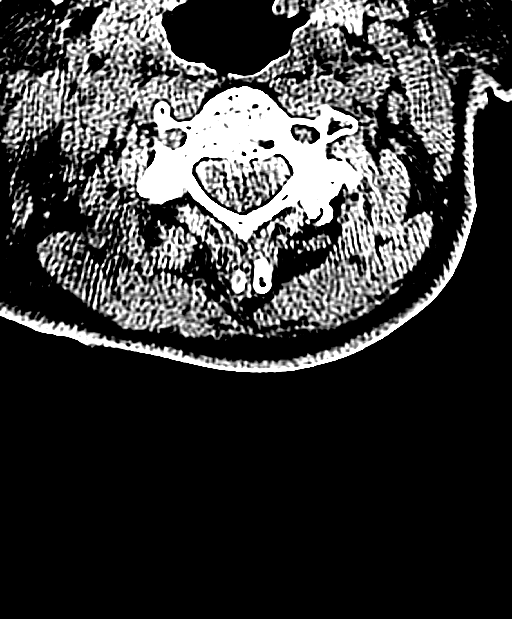
[im 97/119  brain]
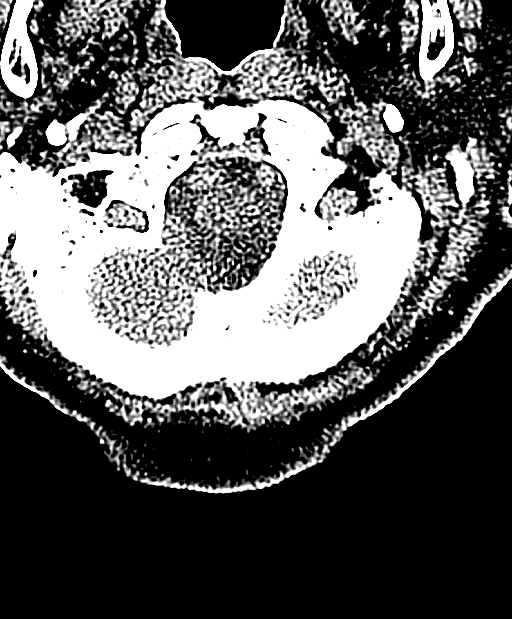
[im 108/119  brain]
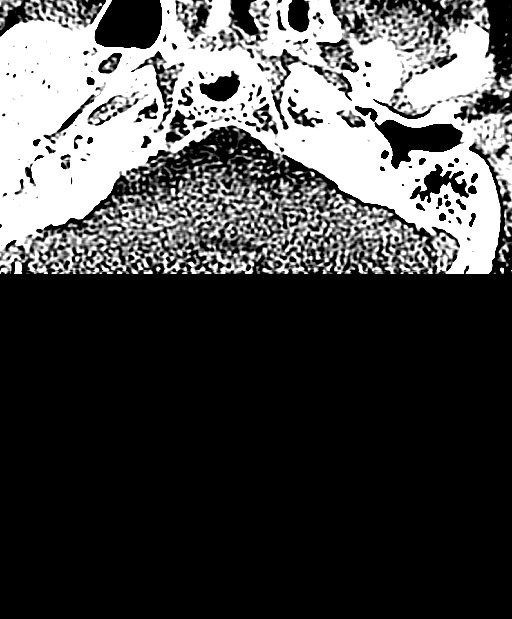

[13 of 47 positions shown; findings below may reference images not displayed]

FINDINGS: CT HEAD FINDINGS

Brain: There is no evidence of acute cortical infarct, intracranial
hemorrhage, mass, midline shift, or extra-axial fluid collection.
The ventricles and sulci are normal. Patchy cerebral white matter
hypodensities are nonspecific but compatible with moderate chronic
small vessel ischemic disease.

Vascular: Calcified atherosclerosis at the skullbase. No hyperdense
vessel.

Skull: No fracture or focal osseous lesion.

Sinuses/Orbits: Visualized paranasal sinuses are clear. There is
complete right mastoid air cell opacification. The right middle ear
cavity is also nearly completely opacified. Bilateral cataract
extraction.

Other: None.

CT CERVICAL SPINE FINDINGS

Alignment: Cervical spine straightening.  No subluxation.

Skull base and vertebrae: No evidence of acute fracture or
destructive osseous process.

Soft tissues and spinal canal: No prevertebral fluid or swelling. No
visible canal hematoma.

Disc levels: Diffuse anterior vertebral spurring from C3-T2. Mild
multilevel disc space narrowing. Moderately large partially
calcified central disc extrusion at C4-5 resulting in severe spinal
stenosis. Smaller disc protrusion at C3-4. Posterior longitudinal
ligament thickening in the upper cervical spine.

Upper chest: Mild centrilobular emphysema in the lung apices. Left
greater than right pleuroparenchymal scarring with calcification.

Other: None.
IMPRESSION: 1. No evidence of acute intracranial abnormality.
2. Moderate chronic small vessel ischemic disease.
3. Right otomastoiditis.
4. No evidence of acute fracture or subluxation in the cervical
spine.
5. C4-5 disc extrusion with severe spinal stenosis.

## 2018-05-31 IMAGING — CR DG LUMBAR SPINE COMPLETE 4+V
4 series · 4 of 4 positions shown · non-contrast
Comparison: CT of the abdomen and pelvis performed 08/07/2011

CLINICAL DATA: Status post fall out of bed, with lower back pain.
Initial encounter.

EXAM:
LUMBAR SPINE - COMPLETE 4+ VIEW

[t lumbar spine ap]
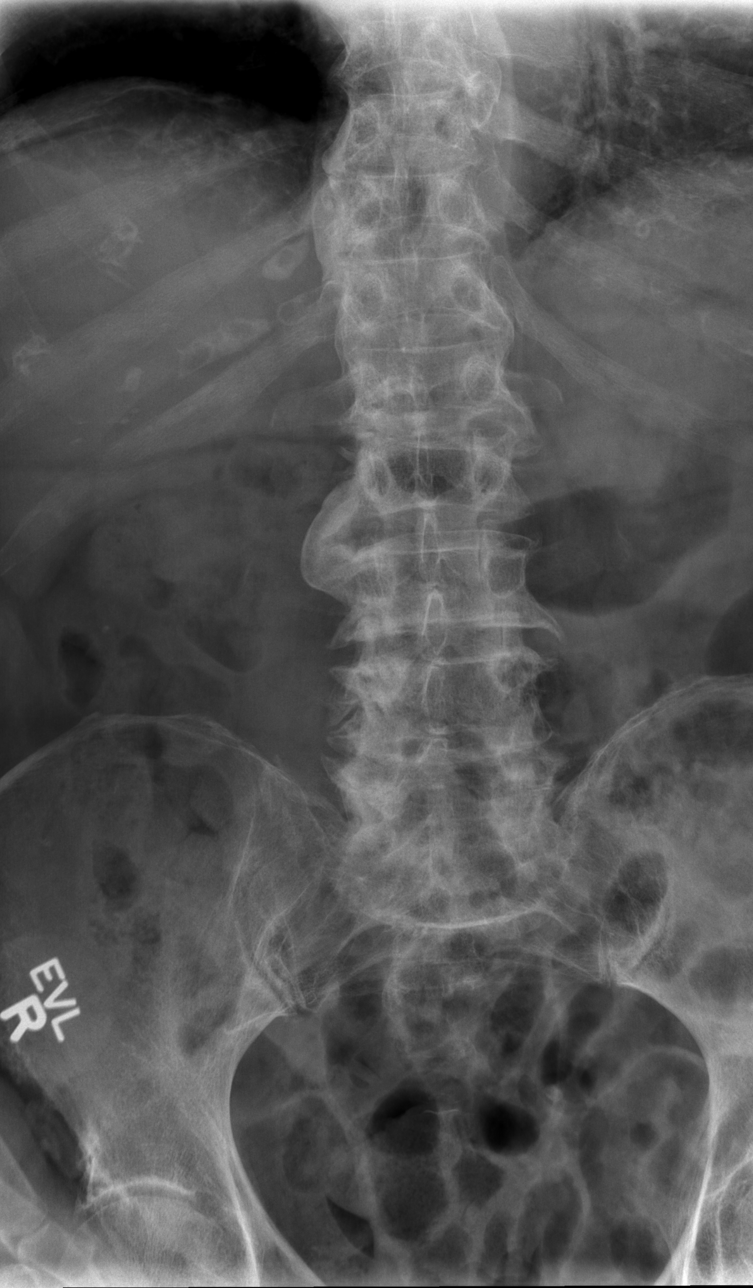

[t lumbar spine obl (1 of 2)]
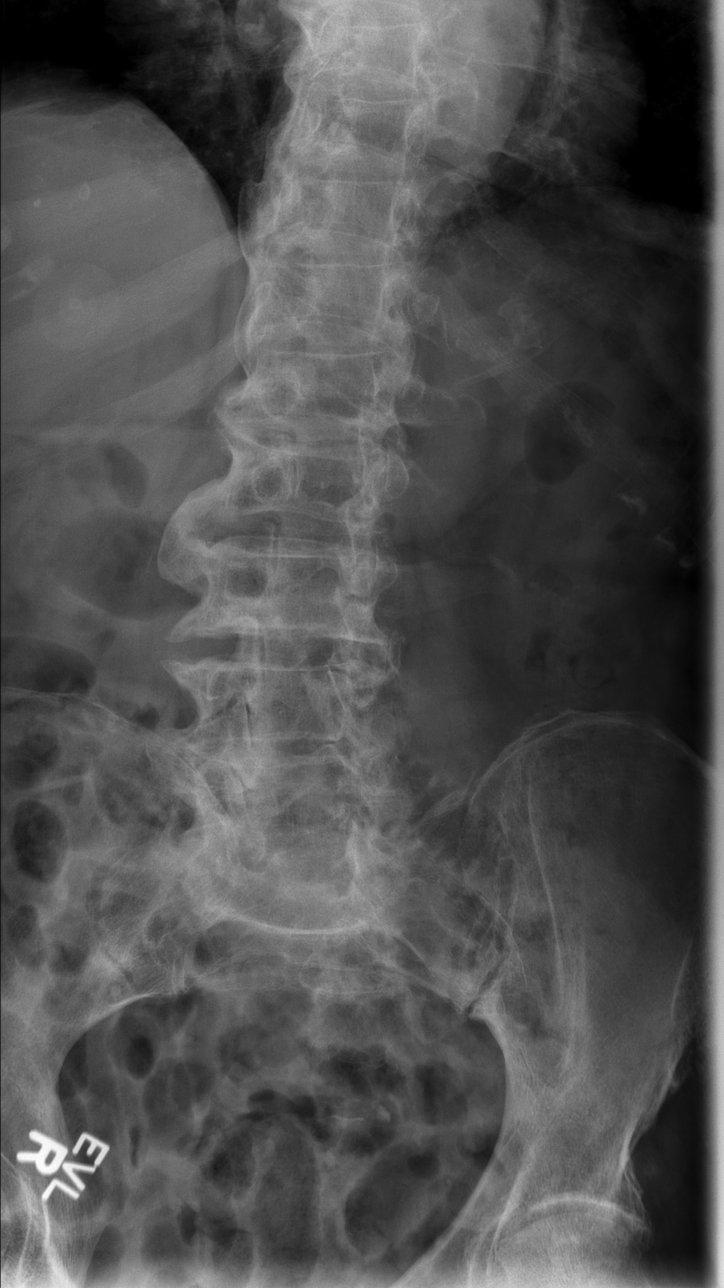

[t lumbar spine obl (2 of 2)]
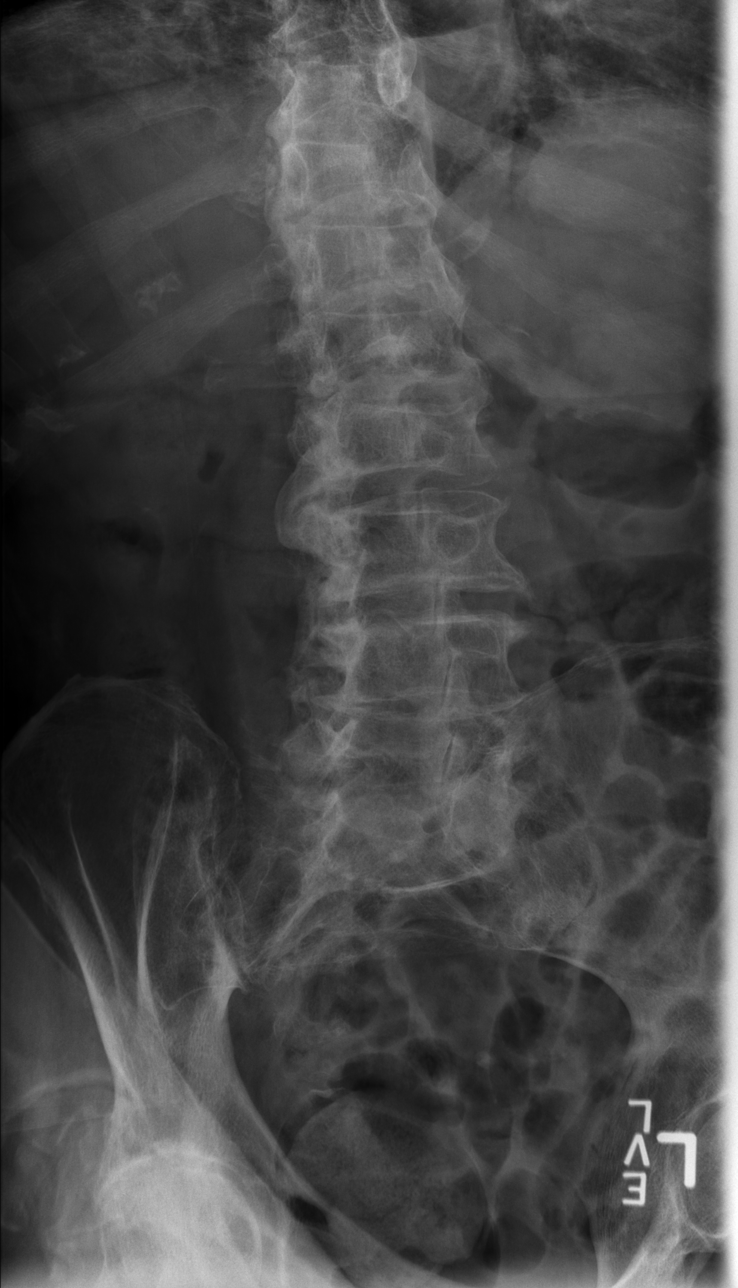

[w lumbar spine lat]
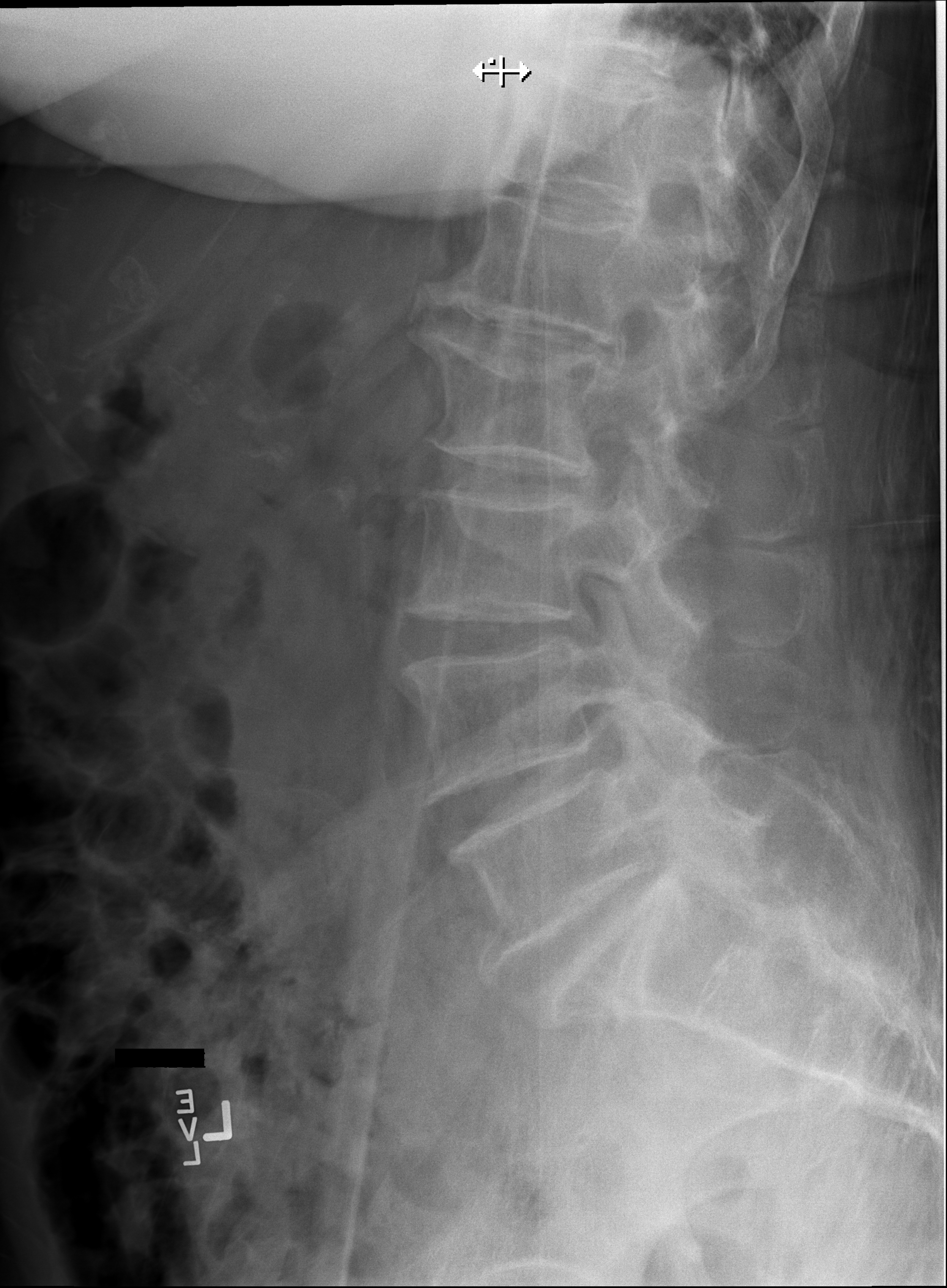

[4 of 4 positions shown; findings below may reference images not displayed]

FINDINGS: There is no evidence of fracture or subluxation. There is slight
chronic loss of height at the superior endplate of L2, stable from
6928. Vertebral bodies demonstrate normal alignment. Intervertebral
disc spaces are preserved. The visualized neural foramina are
grossly unremarkable in appearance. Scattered anterior and lateral
osteophytes are noted along the lumbar spine.

The visualized bowel gas pattern is unremarkable in appearance; air
and stool are noted within the colon. The sacroiliac joints are
within normal limits.
IMPRESSION: No evidence of acute fracture or subluxation along the lumbar spine.

## 2018-05-31 IMAGING — CR DG HUMERUS 2V *R*
2 series · 2 of 2 positions shown · non-contrast
Comparison: None.

CLINICAL DATA: Follow-up right humeral fracture. Subsequent
encounter.

EXAM:
RIGHT HUMERUS - 2+ VIEW

[t humerus ap right]
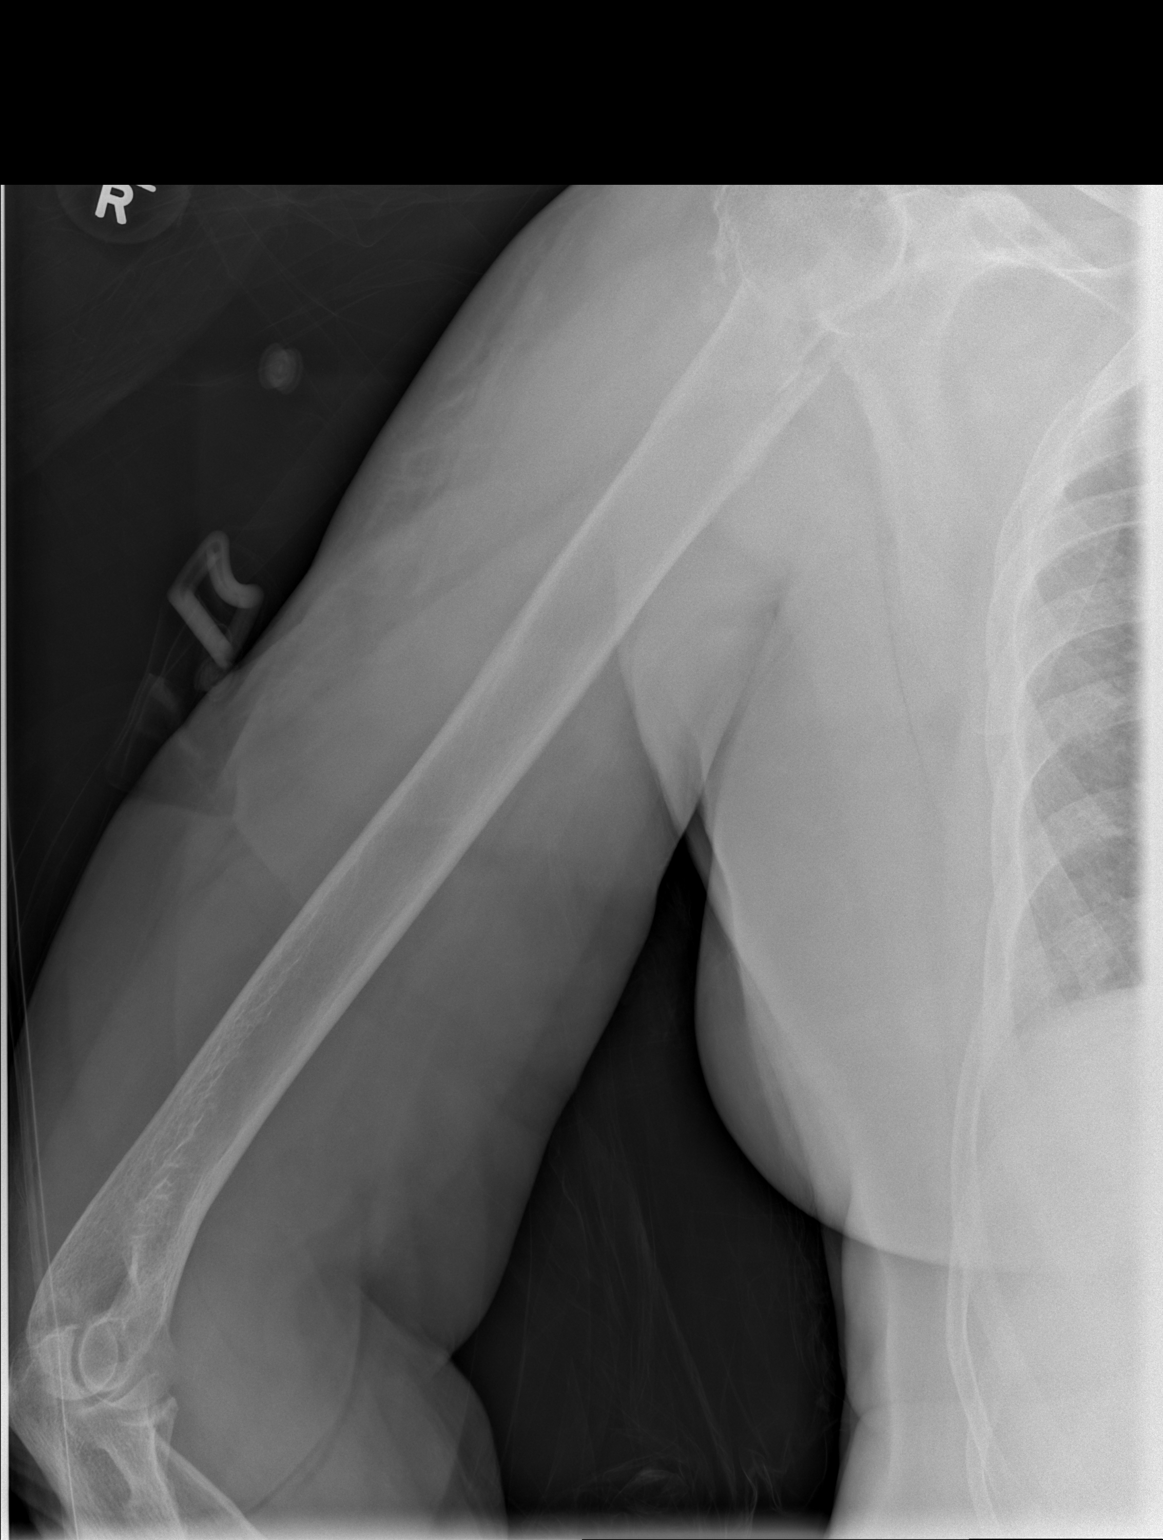

[w trans-thoracic humerus]
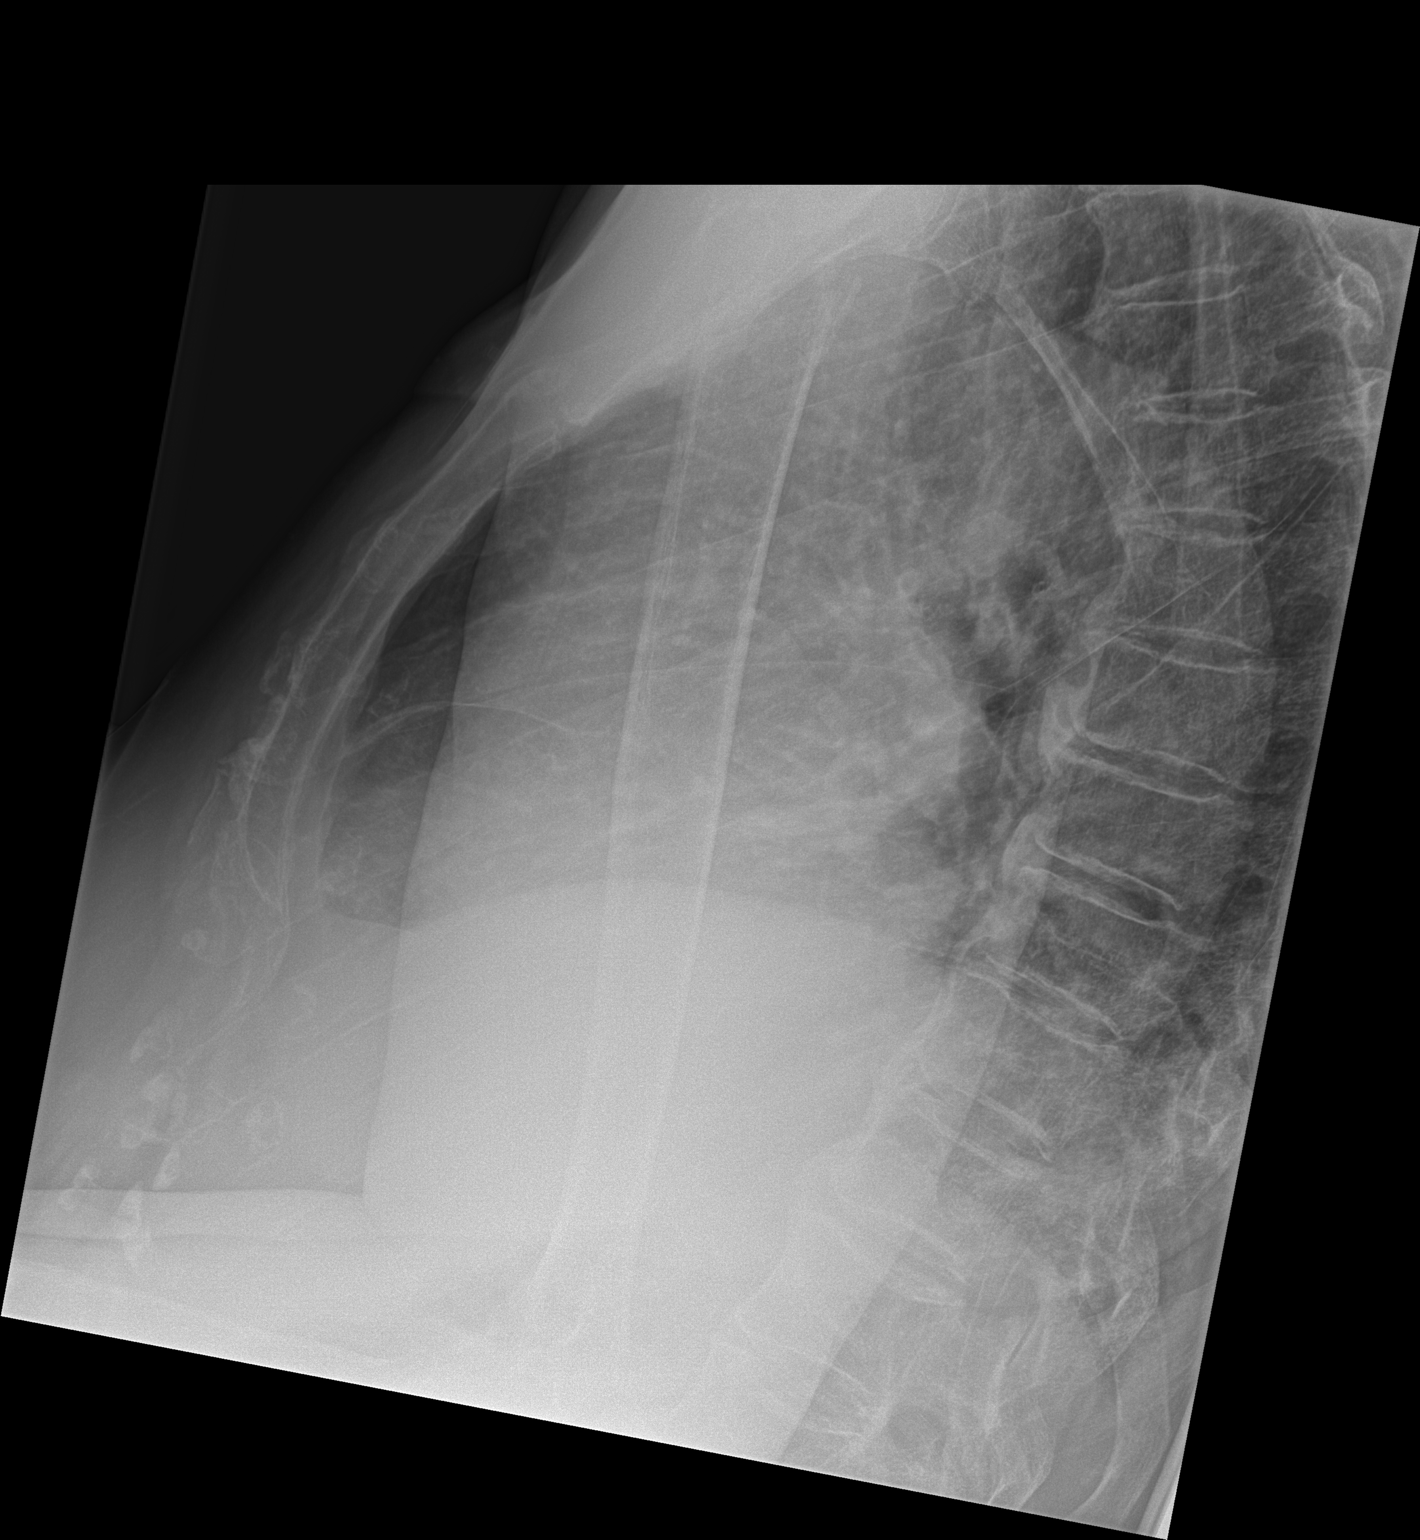

[2 of 2 positions shown; findings below may reference images not displayed]

FINDINGS: The patient's right humeral neck fracture is again noted, with
approximately [DATE] shaft width medial displacement.

Mild degenerative change is noted at the right acromioclavicular
joint. No new fractures are seen. The elbow joint is incompletely
assessed, but appears grossly unremarkable.
IMPRESSION: Right humeral neck fracture again noted, with approximately [DATE]
shaft width medial displacement.

## 2018-06-04 ENCOUNTER — Ambulatory Visit (INDEPENDENT_AMBULATORY_CARE_PROVIDER_SITE_OTHER): Payer: Medicare Other | Admitting: Podiatry

## 2018-06-04 ENCOUNTER — Encounter: Payer: Self-pay | Admitting: Podiatry

## 2018-06-04 DIAGNOSIS — B351 Tinea unguium: Secondary | ICD-10-CM | POA: Diagnosis not present

## 2018-06-04 DIAGNOSIS — E119 Type 2 diabetes mellitus without complications: Secondary | ICD-10-CM

## 2018-06-04 DIAGNOSIS — M79609 Pain in unspecified limb: Secondary | ICD-10-CM

## 2018-06-04 NOTE — Progress Notes (Signed)
Patient ID: Kirsten Kelly, female   DOB: 11/01/1927, 84 y.o.   MRN: [id number] Complaint:  Visit Type: Patient returns to my office for continued preventative foot care services. Complaint: Patient states" my nails have grown long and thick and become painful to walk and wear shoes" Patient has been diagnosed with DM with no foot complications. The patient presents for preventative foot care services. No changes to ROS  Podiatric Exam: Vascular: dorsalis pedis and posterior tibial pulses are palpable bilateral. Capillary return is immediate. Temperature gradient is WNL. Skin turgor WNL  Sensorium: Diminished  Semmes Weinstein monofilament test. Normal tactile sensation bilaterally. Nail Exam: Pt has thick disfigured discolored nails with subungual debris noted bilateral entire nail hallux through fifth toenails Ulcer Exam: There is no evidence of ulcer or pre-ulcerative changes or infection. Orthopedic Exam: Muscle tone and strength are WNL. No limitations in general ROM. No crepitus or effusions noted. Foot type and digits show no abnormalities. HAV  B/L Skin: No Porokeratosis. No infection or ulcers  Diagnosis:  Onychomycosis, , Pain in right toe, pain in left toes  Treatment & Plan Procedures and Treatment: Consent by patient was obtained for treatment procedures. The patient understood the discussion of treatment and procedures well. All questions were answered thoroughly reviewed. Debridement of mycotic and hypertrophic toenails, 1 through 5 bilateral and clearing of subungual debris. No ulceration, no infection noted.ABN signed for 2019.   Return Visit-Office Procedure: Patient instructed to return to the office for a follow up visit 3 months for continued evaluation and treatment.    Aleyssa Pike DPM 

## 2018-06-18 DIAGNOSIS — R42 Dizziness and giddiness: Secondary | ICD-10-CM | POA: Diagnosis not present

## 2018-06-18 DIAGNOSIS — M542 Cervicalgia: Secondary | ICD-10-CM | POA: Diagnosis not present

## 2018-06-18 DIAGNOSIS — Z23 Encounter for immunization: Secondary | ICD-10-CM | POA: Diagnosis not present

## 2018-06-18 DIAGNOSIS — I10 Essential (primary) hypertension: Secondary | ICD-10-CM | POA: Diagnosis not present

## 2018-06-18 DIAGNOSIS — E1169 Type 2 diabetes mellitus with other specified complication: Secondary | ICD-10-CM | POA: Diagnosis not present

## 2018-06-18 DIAGNOSIS — E785 Hyperlipidemia, unspecified: Secondary | ICD-10-CM | POA: Diagnosis not present

## 2018-06-18 DIAGNOSIS — F325 Major depressive disorder, single episode, in full remission: Secondary | ICD-10-CM | POA: Diagnosis not present

## 2018-06-24 ENCOUNTER — Other Ambulatory Visit: Payer: Self-pay | Admitting: Family Medicine

## 2018-06-24 DIAGNOSIS — Z1231 Encounter for screening mammogram for malignant neoplasm of breast: Secondary | ICD-10-CM

## 2018-08-08 ENCOUNTER — Ambulatory Visit
Admission: RE | Admit: 2018-08-08 | Discharge: 2018-08-08 | Disposition: A | Discharge status: Home / Self Care | Payer: Medicare Other | Source: Ambulatory Visit | Attending: Family Medicine | Admitting: Family Medicine

## 2018-08-08 DIAGNOSIS — Z1231 Encounter for screening mammogram for malignant neoplasm of breast: Secondary | ICD-10-CM

## 2018-09-03 ENCOUNTER — Encounter: Payer: Self-pay | Admitting: Podiatry

## 2018-09-03 ENCOUNTER — Ambulatory Visit (INDEPENDENT_AMBULATORY_CARE_PROVIDER_SITE_OTHER): Payer: Medicare Other | Admitting: Podiatry

## 2018-09-03 DIAGNOSIS — B351 Tinea unguium: Secondary | ICD-10-CM

## 2018-09-03 DIAGNOSIS — M79676 Pain in unspecified toe(s): Secondary | ICD-10-CM | POA: Diagnosis not present

## 2018-09-03 DIAGNOSIS — M79609 Pain in unspecified limb: Principal | ICD-10-CM

## 2018-09-03 DIAGNOSIS — E119 Type 2 diabetes mellitus without complications: Secondary | ICD-10-CM

## 2018-09-03 NOTE — Progress Notes (Signed)
Patient ID: Kirsten Kelly, female   DOB: 04-23-1928, 85 y.o.   MRN: [id number] Complaint:  Visit Type: Patient returns to my office for continued preventative foot care services. Complaint: Patient states" my nails have grown long and thick and become painful to walk and wear shoes" Patient has been diagnosed with DM with no foot complications. The patient presents for preventative foot care services. No changes to ROS  Podiatric Exam: Vascular: dorsalis pedis and posterior tibial pulses are palpable bilateral. Capillary return is immediate. Temperature gradient is WNL. Skin turgor WNL  Sensorium: Diminished  Semmes Weinstein monofilament test. Normal tactile sensation bilaterally. Nail Exam: Pt has thick disfigured discolored nails with subungual debris noted bilateral entire nail hallux through fifth toenails.  Pincer right hallux nail. Ulcer Exam: There is no evidence of ulcer or pre-ulcerative changes or infection. Orthopedic Exam: Muscle tone and strength are WNL. No limitations in general ROM. No crepitus or effusions noted. Foot type and digits show no abnormalities. HAV  B/L Skin: No Porokeratosis. No infection or ulcers  Diagnosis:  Onychomycosis, , Pain in right toe, pain in left toes  Treatment & Plan Procedures and Treatment: Consent by patient was obtained for treatment procedures. The patient understood the discussion of treatment and procedures well. All questions were answered thoroughly reviewed. Debridement of mycotic and hypertrophic toenails, 1 through 5 bilateral and clearing of subungual debris. No ulceration, no infection noted.   Return Visit-Office Procedure: Patient instructed to return to the office for a follow up visit 3 months for continued evaluation and treatment.    Helane GuntherGregory Maddux First DPM

## 2018-11-12 ENCOUNTER — Ambulatory Visit (INDEPENDENT_AMBULATORY_CARE_PROVIDER_SITE_OTHER): Payer: Medicare Other | Admitting: Podiatry

## 2018-11-12 ENCOUNTER — Other Ambulatory Visit: Payer: Self-pay

## 2018-11-12 ENCOUNTER — Encounter: Payer: Self-pay | Admitting: Podiatry

## 2018-11-12 VITALS — Temp 97.7°F

## 2018-11-12 DIAGNOSIS — M79676 Pain in unspecified toe(s): Secondary | ICD-10-CM

## 2018-11-12 DIAGNOSIS — B351 Tinea unguium: Secondary | ICD-10-CM | POA: Diagnosis not present

## 2018-11-12 DIAGNOSIS — M79609 Pain in unspecified limb: Principal | ICD-10-CM

## 2018-11-12 DIAGNOSIS — E119 Type 2 diabetes mellitus without complications: Secondary | ICD-10-CM | POA: Diagnosis not present

## 2018-11-12 DIAGNOSIS — M2010 Hallux valgus (acquired), unspecified foot: Secondary | ICD-10-CM

## 2018-11-12 NOTE — Progress Notes (Signed)
Patient ID: ADRIKA ZURFLUH, female   DOB: 06/25/1928, 85 y.o.   MRN: [id number] Complaint:  Visit Type: Patient returns to my office for continued preventative foot care services. Complaint: Patient states" my nails have grown long and thick and become painful to walk and wear shoes" Patient has been diagnosed with DM with no foot complications. The patient presents for preventative foot care services. No changes to ROS  Podiatric Exam: Vascular: dorsalis pedis and posterior tibial pulses are palpable bilateral. Capillary return is immediate. Temperature gradient is WNL. Skin turgor WNL  Sensorium: Diminished  Semmes Weinstein monofilament test. Normal tactile sensation bilaterally. Nail Exam: Pt has thick disfigured discolored nails with subungual debris noted bilateral entire nail hallux through fifth toenails.  Pincer right hallux nail. Ulcer Exam: There is no evidence of ulcer or pre-ulcerative changes or infection. Orthopedic Exam: Muscle tone and strength are WNL. No limitations in general ROM. No crepitus or effusions noted. Foot type and digits show no abnormalities. HAV  B/L Skin: Asymptomatic  Porokeratosis left foot.. No infection or ulcers  Diagnosis:  Onychomycosis, , Pain in right toe, pain in left toes  Treatment & Plan Procedures and Treatment: Consent by patient was obtained for treatment procedures. The patient understood the discussion of treatment and procedures well. All questions were answered thoroughly reviewed. Debridement of mycotic and hypertrophic toenails, 1 through 5 bilateral and clearing of subungual debris. No ulceration, no infection noted.   Return Visit-Office Procedure: Patient instructed to return to the office for a follow up visit 3 months for continued evaluation and treatment.    Helane Gunther DPM

## 2019-02-11 ENCOUNTER — Encounter: Payer: Self-pay | Admitting: Podiatry

## 2019-02-11 ENCOUNTER — Ambulatory Visit (INDEPENDENT_AMBULATORY_CARE_PROVIDER_SITE_OTHER): Payer: Medicare Other | Admitting: Podiatry

## 2019-02-11 ENCOUNTER — Other Ambulatory Visit: Payer: Self-pay

## 2019-02-11 VITALS — Temp 98.0°F

## 2019-02-11 DIAGNOSIS — B351 Tinea unguium: Secondary | ICD-10-CM | POA: Diagnosis not present

## 2019-02-11 DIAGNOSIS — M79676 Pain in unspecified toe(s): Secondary | ICD-10-CM | POA: Diagnosis not present

## 2019-02-11 DIAGNOSIS — E119 Type 2 diabetes mellitus without complications: Secondary | ICD-10-CM

## 2019-02-11 DIAGNOSIS — M79609 Pain in unspecified limb: Secondary | ICD-10-CM

## 2019-02-11 DIAGNOSIS — M2010 Hallux valgus (acquired), unspecified foot: Secondary | ICD-10-CM

## 2019-02-11 NOTE — Progress Notes (Signed)
Patient ID: Kirsten Kelly, female   DOB: [birth year], 85 y.o.   MRN: [id number] Complaint:  Visit Type: Patient returns to my office for continued preventative foot care services. Complaint: Patient states" my nails have grown long and thick and become painful to walk and wear shoes" Patient has been diagnosed with DM with no foot complications. The patient presents for preventative foot care services. No changes to ROS  Podiatric Exam: Vascular: dorsalis pedis and posterior tibial pulses are palpable bilateral. Capillary return is immediate. Temperature gradient is WNL. Skin turgor WNL  Sensorium: Diminished  Semmes Weinstein monofilament test. Normal tactile sensation bilaterally. Nail Exam: Pt has thick disfigured discolored nails with subungual debris noted bilateral entire nail hallux through fifth toenails.  Pincer right hallux nail. Ulcer Exam: There is no evidence of ulcer or pre-ulcerative changes or infection. Orthopedic Exam: Muscle tone and strength are WNL. No limitations in general ROM. No crepitus or effusions noted. Foot type and digits show no abnormalities. HAV  B/L Skin: Asymptomatic  Porokeratosis left foot.. No infection or ulcers  Diagnosis:  Onychomycosis, , Pain in right toe, pain in left toes  Treatment & Plan Procedures and Treatment: Consent by patient was obtained for treatment procedures. The patient understood the discussion of treatment and procedures well. All questions were answered thoroughly reviewed. Debridement of mycotic and hypertrophic toenails, 1 through 5 bilateral and clearing of subungual debris. No ulceration, no infection noted.   Return Visit-Office Procedure: Patient instructed to return to the office for a follow up visit 3 months for continued evaluation and treatment.    Gardiner Barefoot DPM

## 2019-03-23 DIAGNOSIS — Z961 Presence of intraocular lens: Secondary | ICD-10-CM | POA: Diagnosis not present

## 2019-03-23 DIAGNOSIS — H52223 Regular astigmatism, bilateral: Secondary | ICD-10-CM | POA: Diagnosis not present

## 2019-03-23 DIAGNOSIS — E119 Type 2 diabetes mellitus without complications: Secondary | ICD-10-CM | POA: Diagnosis not present

## 2019-03-23 DIAGNOSIS — H43813 Vitreous degeneration, bilateral: Secondary | ICD-10-CM | POA: Diagnosis not present

## 2019-03-23 DIAGNOSIS — H5213 Myopia, bilateral: Secondary | ICD-10-CM | POA: Diagnosis not present

## 2019-03-23 DIAGNOSIS — H35371 Puckering of macula, right eye: Secondary | ICD-10-CM | POA: Diagnosis not present

## 2019-05-20 ENCOUNTER — Other Ambulatory Visit: Payer: Self-pay

## 2019-05-20 ENCOUNTER — Ambulatory Visit (INDEPENDENT_AMBULATORY_CARE_PROVIDER_SITE_OTHER): Payer: Medicare Other | Admitting: Podiatry

## 2019-05-20 ENCOUNTER — Encounter: Payer: Self-pay | Admitting: Podiatry

## 2019-05-20 DIAGNOSIS — B351 Tinea unguium: Secondary | ICD-10-CM

## 2019-05-20 DIAGNOSIS — M79676 Pain in unspecified toe(s): Secondary | ICD-10-CM | POA: Diagnosis not present

## 2019-05-20 DIAGNOSIS — E119 Type 2 diabetes mellitus without complications: Secondary | ICD-10-CM | POA: Diagnosis not present

## 2019-05-20 DIAGNOSIS — M79609 Pain in unspecified limb: Secondary | ICD-10-CM

## 2019-05-20 NOTE — Progress Notes (Signed)
Patient ID: Kirsten Kelly, female   DOB: 09-11-1927, 85 y.o.   MRN: [id number] Complaint:  Visit Type: Patient returns to my office for continued preventative foot care services. Complaint: Patient states" my nails have grown long and thick and become painful to walk and wear shoes" Patient has been diagnosed with DM with no foot complications. The patient presents for preventative foot care services. No changes to ROS  Podiatric Exam: Vascular: dorsalis pedis and posterior tibial pulses are palpable bilateral. Capillary return is immediate. Temperature gradient is WNL. Skin turgor WNL  Sensorium: Diminished  Semmes Weinstein monofilament test. Normal tactile sensation bilaterally. Nail Exam: Pt has thick disfigured discolored nails with subungual debris noted bilateral entire nail hallux through fifth toenails.  Pincer right hallux nail. Ulcer Exam: There is no evidence of ulcer or pre-ulcerative changes or infection. Orthopedic Exam: Muscle tone and strength are WNL. No limitations in general ROM. No crepitus or effusions noted. Foot type and digits show no abnormalities. HAV  B/L Skin: Asymptomatic  Porokeratosis left foot.. No infection or ulcers  Diagnosis:  Onychomycosis, , Pain in right toe, pain in left toes  Treatment & Plan Procedures and Treatment: Consent by patient was obtained for treatment procedures. The patient understood the discussion of treatment and procedures well. All questions were answered thoroughly reviewed. Debridement of mycotic and hypertrophic toenails, 1 through 5 bilateral and clearing of subungual debris. No ulceration, no infection noted.   Return Visit-Office Procedure: Patient instructed to return to the office for a follow up visit 3 months for continued evaluation and treatment.    Gardiner Barefoot DPM

## 2019-08-19 ENCOUNTER — Ambulatory Visit: Payer: Medicare Other | Admitting: Podiatry

## 2019-08-21 DIAGNOSIS — F321 Major depressive disorder, single episode, moderate: Secondary | ICD-10-CM | POA: Diagnosis not present

## 2019-08-21 DIAGNOSIS — E441 Mild protein-calorie malnutrition: Secondary | ICD-10-CM | POA: Diagnosis not present

## 2019-08-21 DIAGNOSIS — E785 Hyperlipidemia, unspecified: Secondary | ICD-10-CM | POA: Diagnosis not present

## 2019-08-21 DIAGNOSIS — E1169 Type 2 diabetes mellitus with other specified complication: Secondary | ICD-10-CM | POA: Diagnosis not present

## 2019-08-21 DIAGNOSIS — I10 Essential (primary) hypertension: Secondary | ICD-10-CM | POA: Diagnosis not present

## 2019-08-25 ENCOUNTER — Ambulatory Visit: Payer: Medicare Other

## 2019-09-05 ENCOUNTER — Ambulatory Visit: Payer: Medicare Other | Attending: Internal Medicine

## 2019-09-05 DIAGNOSIS — Z23 Encounter for immunization: Secondary | ICD-10-CM | POA: Insufficient documentation

## 2019-09-05 NOTE — Progress Notes (Signed)
   Covid-19 Vaccination Clinic  Name:  MYRON LONA    MRN: [id number] DOB: [birth year]  09/05/2019  Ms. Grills was observed post Covid-19 immunization for 30 minutes based on pre-vaccination screening without incidence. She was provided with Vaccine Information Sheet and instruction to access the V-Safe system.   Ms. Charlie was instructed to call 911 with any severe reactions post vaccine: Marland Kitchen Difficulty breathing  . Swelling of your face and throat  . A fast heartbeat  . A bad rash all over your body  . Dizziness and weakness    Immunizations Administered    Name Date Dose VIS Date Route   Pfizer COVID-19 Vaccine 09/05/2019 10:32 AM 0.3 mL 07/10/2019 Intramuscular   Manufacturer: [employer], [employer]   Lot: [id number]   NDC: [address]-5

## 2019-09-15 ENCOUNTER — Ambulatory Visit: Payer: Medicare Other

## 2019-09-30 ENCOUNTER — Ambulatory Visit: Payer: Medicare Other | Attending: Internal Medicine

## 2019-09-30 DIAGNOSIS — Z23 Encounter for immunization: Secondary | ICD-10-CM | POA: Insufficient documentation

## 2019-09-30 NOTE — Progress Notes (Signed)
   Covid-19 Vaccination Clinic  Name:  Kirsten Kelly    MRN: [id number] DOB: [birth year]  09/30/2019  Kirsten Kelly was observed post Covid-19 immunization for 15 minutes without incident. She was provided with Vaccine Information Sheet and instruction to access the V-Safe system.   Kirsten Kelly was instructed to call 911 with any severe reactions post vaccine: Marland Kitchen Difficulty breathing  . Swelling of face and throat  . A fast heartbeat  . A bad rash all over body  . Dizziness and weakness   Immunizations Administered    Name Date Dose VIS Date Route   Pfizer COVID-19 Vaccine 09/30/2019  2:40 PM 0.3 mL 07/10/2019 Intramuscular   Manufacturer: [employer], [employer]   Lot: [id number]   NDC: [address]-7

## 2019-10-06 ENCOUNTER — Ambulatory Visit: Payer: Medicare Other | Admitting: Podiatry

## 2019-11-10 ENCOUNTER — Encounter: Payer: Self-pay | Admitting: Podiatry

## 2019-11-10 ENCOUNTER — Other Ambulatory Visit: Payer: Self-pay

## 2019-11-10 ENCOUNTER — Ambulatory Visit (INDEPENDENT_AMBULATORY_CARE_PROVIDER_SITE_OTHER): Payer: Medicare Other | Admitting: Podiatry

## 2019-11-10 VITALS — Temp 98.4°F

## 2019-11-10 DIAGNOSIS — M79676 Pain in unspecified toe(s): Secondary | ICD-10-CM

## 2019-11-10 DIAGNOSIS — E119 Type 2 diabetes mellitus without complications: Secondary | ICD-10-CM

## 2019-11-10 DIAGNOSIS — M2010 Hallux valgus (acquired), unspecified foot: Secondary | ICD-10-CM

## 2019-11-10 DIAGNOSIS — B351 Tinea unguium: Secondary | ICD-10-CM | POA: Diagnosis not present

## 2019-11-10 NOTE — Progress Notes (Signed)
This patient returns to my office for at risk foot care.  This patient requires this care by a professional since this patient will be at risk due to having diabetes mellitus.    This patient is unable to cut nails herself since the patient cannot reach her nails.These nails are painful walking and wearing shoes.  This patient presents for at risk foot care today. Patient has not been seen in over 6 months.  General Appearance  Alert, conversant and in no acute stress.  Vascular  Dorsalis pedis and posterior tibial  pulses are palpable  bilaterally.  Capillary return is within normal limits  bilaterally. Temperature is within normal limits  bilaterally.  Neurologic  Senn-Weinstein monofilament wire test diminished   bilaterally. Muscle power within normal limits bilaterally.  Nails Thick disfigured discolored nails with subungual debris  from hallux to fifth toes bilaterally. No evidence of bacterial infection or drainage bilaterally.  Orthopedic  No limitations of motion  feet .  No crepitus or effusions noted.  No bony pathology or digital deformities noted.  HAV  B/L.  Skin  normotropic skin with no porokeratosis noted bilaterally.  No signs of infections or ulcers noted.     Onychomycosis  Pain in right toes  Pain in left toes  Consent was obtained for treatment procedures.   Mechanical debridement of nails 1-5  bilaterally performed with a nail nipper.  Filed with dremel without incident.    Return office visit   3 months                   Told patient to return for periodic foot care and evaluation due to potential at risk complications.   Helane Gunther DPM

## 2020-02-10 ENCOUNTER — Other Ambulatory Visit: Payer: Self-pay

## 2020-02-10 ENCOUNTER — Ambulatory Visit (INDEPENDENT_AMBULATORY_CARE_PROVIDER_SITE_OTHER): Payer: Medicare Other | Admitting: Podiatry

## 2020-02-10 ENCOUNTER — Encounter: Payer: Self-pay | Admitting: Podiatry

## 2020-02-10 DIAGNOSIS — B351 Tinea unguium: Secondary | ICD-10-CM

## 2020-02-10 DIAGNOSIS — M79609 Pain in unspecified limb: Secondary | ICD-10-CM

## 2020-02-10 DIAGNOSIS — E119 Type 2 diabetes mellitus without complications: Secondary | ICD-10-CM | POA: Diagnosis not present

## 2020-02-10 DIAGNOSIS — M2010 Hallux valgus (acquired), unspecified foot: Secondary | ICD-10-CM | POA: Diagnosis not present

## 2020-02-10 NOTE — Progress Notes (Signed)
This patient returns to my office for at risk foot care.  This patient requires this care by a professional since this patient will be at risk due to having diabetes mellitus.    This patient is unable to cut nails herself since the patient cannot reach her nails.These nails are painful walking and wearing shoes.  This patient presents for at risk foot care today.   General Appearance  Alert, conversant and in no acute stress.  Vascular  Dorsalis pedis and posterior tibial  pulses are palpable  bilaterally.  Capillary return is within normal limits  bilaterally. Temperature is within normal limits  bilaterally.  Neurologic  Senn-Weinstein monofilament wire test diminished   bilaterally. Muscle power within normal limits bilaterally.  Nails Thick disfigured discolored nails with subungual debris  from hallux to fifth toes bilaterally. No evidence of bacterial infection or drainage bilaterally.  Orthopedic  No limitations of motion  feet .  No crepitus or effusions noted.  No bony pathology or digital deformities noted.  HAV  B/L.  Skin  normotropic skin with no porokeratosis noted bilaterally.  No signs of infections or ulcers noted.     Onychomycosis  Pain in right toes  Pain in left toes  Consent was obtained for treatment procedures.   Mechanical debridement of nails 1-5  bilaterally performed with a nail nipper.  Filed with dremel without incident.    Return office visit   10 weeks                  Told patient to return for periodic foot care and evaluation due to potential at risk complications.   Helane Gunther DPM

## 2020-04-14 DIAGNOSIS — Z6830 Body mass index (BMI) 30.0-30.9, adult: Secondary | ICD-10-CM | POA: Diagnosis not present

## 2020-04-14 DIAGNOSIS — E669 Obesity, unspecified: Secondary | ICD-10-CM | POA: Diagnosis not present

## 2020-04-14 DIAGNOSIS — G3184 Mild cognitive impairment, so stated: Secondary | ICD-10-CM | POA: Diagnosis not present

## 2020-04-14 DIAGNOSIS — J309 Allergic rhinitis, unspecified: Secondary | ICD-10-CM | POA: Diagnosis not present

## 2020-04-14 DIAGNOSIS — I10 Essential (primary) hypertension: Secondary | ICD-10-CM | POA: Diagnosis not present

## 2020-04-14 DIAGNOSIS — R69 Illness, unspecified: Secondary | ICD-10-CM | POA: Diagnosis not present

## 2020-04-14 DIAGNOSIS — E119 Type 2 diabetes mellitus without complications: Secondary | ICD-10-CM | POA: Diagnosis not present

## 2020-04-14 DIAGNOSIS — M1990 Unspecified osteoarthritis, unspecified site: Secondary | ICD-10-CM | POA: Diagnosis not present

## 2020-04-14 DIAGNOSIS — E785 Hyperlipidemia, unspecified: Secondary | ICD-10-CM | POA: Diagnosis not present

## 2020-04-14 DIAGNOSIS — Z791 Long term (current) use of non-steroidal anti-inflammatories (NSAID): Secondary | ICD-10-CM | POA: Diagnosis not present

## 2020-05-04 ENCOUNTER — Other Ambulatory Visit: Payer: Self-pay

## 2020-05-04 ENCOUNTER — Encounter: Payer: Self-pay | Admitting: Podiatry

## 2020-05-04 ENCOUNTER — Ambulatory Visit (INDEPENDENT_AMBULATORY_CARE_PROVIDER_SITE_OTHER): Payer: BC Managed Care – PPO | Admitting: Podiatry

## 2020-05-04 DIAGNOSIS — E119 Type 2 diabetes mellitus without complications: Secondary | ICD-10-CM

## 2020-05-04 DIAGNOSIS — M79609 Pain in unspecified limb: Secondary | ICD-10-CM

## 2020-05-04 DIAGNOSIS — B351 Tinea unguium: Secondary | ICD-10-CM

## 2020-05-04 DIAGNOSIS — M2010 Hallux valgus (acquired), unspecified foot: Secondary | ICD-10-CM

## 2020-05-04 NOTE — Progress Notes (Signed)
This patient returns to my office for at risk foot care.  This patient requires this care by a professional since this patient will be at risk due to having diabetes mellitus.    This patient is unable to cut nails herself since the patient cannot reach her nails.These nails are painful walking and wearing shoes.  This patient presents for at risk foot care today.   General Appearance  Alert, conversant and in no acute stress.  Vascular  Dorsalis pedis and posterior tibial  pulses are palpable  bilaterally.  Capillary return is within normal limits  bilaterally. Temperature is within normal limits  bilaterally.  Neurologic  Senn-Weinstein monofilament wire test diminished   bilaterally. Muscle power within normal limits bilaterally.  Nails Thick disfigured discolored nails with subungual debris  from hallux to fifth toes bilaterally. No evidence of bacterial infection or drainage bilaterally.  Orthopedic  No limitations of motion  feet .  No crepitus or effusions noted.  No bony pathology or digital deformities noted.  HAV  B/L.  Skin  normotropic skin with no porokeratosis noted bilaterally.  No signs of infections or ulcers noted.     Onychomycosis  Pain in right toes  Pain in left toes  Consent was obtained for treatment procedures.   Mechanical debridement of nails 1-5  bilaterally performed with a nail nipper.  Filed with dremel without incident.    Return office visit   10 weeks                  Told patient to return for periodic foot care and evaluation due to potential at risk complications.   Helane Gunther DPM

## 2020-05-28 ENCOUNTER — Ambulatory Visit: Payer: BC Managed Care – PPO

## 2020-06-01 DIAGNOSIS — Z23 Encounter for immunization: Secondary | ICD-10-CM | POA: Diagnosis not present

## 2020-06-17 DIAGNOSIS — M542 Cervicalgia: Secondary | ICD-10-CM | POA: Diagnosis not present

## 2020-06-17 DIAGNOSIS — Z20822 Contact with and (suspected) exposure to covid-19: Secondary | ICD-10-CM | POA: Diagnosis not present

## 2020-06-17 DIAGNOSIS — R4182 Altered mental status, unspecified: Secondary | ICD-10-CM | POA: Diagnosis not present

## 2020-06-17 DIAGNOSIS — R2689 Other abnormalities of gait and mobility: Secondary | ICD-10-CM | POA: Diagnosis not present

## 2020-06-17 DIAGNOSIS — Z6829 Body mass index (BMI) 29.0-29.9, adult: Secondary | ICD-10-CM | POA: Diagnosis not present

## 2020-06-17 NOTE — ED Provider Notes (Signed)
 [facility] Emergency Department Provider Note     ED Clinical Impression    Final diagnoses:  Altered mental status, unspecified altered mental status type (Primary)       Impression, ED Course, Assessment and Plan    Impression: Telina Kleckley is a 86 y.o. female with PMH significant for HTN and T2DM who presents to the emergency department today via EMS for evaluation of altered mental status w/ reportedly being a missing person x 3 days.  No significant medical complaints excluding mild left neck pain, no recent head injury and she is not anticoagulated.  No fevers or other systemic signs or symptoms.  Significantly hypertensive in triage at 221/116 otherwise VSS, nontoxic in appearance.  On exam, GCS is 14 and no focal neurological deficits are appreciated otherwise.  Skin is noted to be extremely dry with dry mucous membranes.  She has nonpitting edema noted to bilateral feet/ankles.  Benign exam otherwise.  Ddx includes ICH/CVA vs PRES vs UTI or other infectious process vs lecture light abnormality vs rhabdomyolysis due to extended stay in her car vs ACS vs other encephalopathy.  Plan to obtain basic labs including coags, EtOH/tox, CK, Trop, VBG, UA, EKG, CT head.  Discussed with hospital police were attempting to find family for collateral information.  6:36 PM Labs overall unremarkable including normal troponin/CK, lactate normal.  CT head without any acute abnormalities.  She has no focal neurological deficits, emergent MRI not warranted at this time.  Blood pressure has improved slightly without any intervention at 200/80.  Still pending [facility] to be [facility]ed.  Hospital police was able to get contact information for patient's reported caregiver Rosetta Rolfe.  I did call and speak with this person.  She is someone that used to work with the patient, assists with groceries and taking patient to the store intermittently, visits with the patient frequently  however is not with the patient 24/7.  No family in the area as she states her family resides in [place], patient does not have any children.  She does not have any additional contact information to provide for definitive family.  Unclear if patient has a dementia history as she states patient does not have any definitive diagnosis, she does note intermittent confusion at home however patient is typically able to verbalize the date, name, location which she was not able to do during her visit today.  After urine obtained, plan to admit for AMS, concern for safety of discharge as she still remains confused upon questioning.  Patient is agreeable to this plan.  7:04 PM [facility] recalled to obtain patient medication list - they have refaxed.  8:08 PM UA with slight leuk, proteinuria, ketonuria, 20 WBC and bacteria however contaminated with 50 squamous epithelia.  As she presents for AMS will treat with a one-time dose of fosfomycin.  MAO paged for admission.  I have also discussed w/ social work and placed consult order so that they may evaluate during admission as she potentially will need placement for safety.  8:35 PM Called [facility] back and medication list reconciled in chart.  9:03 PM Pending admission.  Care transferred to MD Lawanda Linker at this time.     Additional Medical Decision Making   I have reviewed the vital signs and the nursing notes. Labs and radiology results that were available during my care of the patient were independently reviewed by me and considered in my medical decision making.   I directly visualized and independently  interpreted the EKG tracing.  I independently visualized the radiology images.  I reviewed the patient's prior medical records.  I discussed the case and plan for continuity of care with the admitting provider.   Portions of this record have been created using Scientist, clinical (histocompatibility and immunogenetics). Dictation errors have been sought, but may not have been identified and  corrected. ____________________________________________      History     Chief Complaint Altered Mental Status   HPI  Rhen Dossantos is a 86 y.o. female with PMH significant for HTN and T2DM who presents to the emergency department today via EMS for evaluation of altered mental status.  Per EMS, patient has been reported as a missing person for the past 3 days.  Patient states that her car ran out of gas and when she turned around there was nothing but [place] surrounding her hence she just went in her car and went to sleep.  It is unclear the happenings of the next couple of days as patient states she has just been sleeping for the past few days.  Denies any recent EtOH or recreational drug use, does state that she occasionally imbibes in alcohol however none over the last week.  Staff at a candy company heard her speaking and called out and then found her on the outlier of the woods in her car.  Patient states that she has been out of her medications for the past 3 days as she has been in her car.  Denies any complaint of pain excluding the left neck, no significant headache, lightheadedness/dizziness, focal neurological deficit, visual disturbance, chest pain, shortness of breath, abdominal pain,n/v/d.  Patient is able to verbalize that she lives in [place], unable to verbalize any of her family contact numbers.  She did verbalize her pharmacy and I have called them and pending for them to fax her medications.  She denies being on any anticoagulants, no recent head injury.  Denies any history of psychiatric disorders or dementia.  She does verbalize that she lives by herself.  The more denies any recent Covid symptoms or Covid exposure has Covid not suspected at this time.   Past Medical History:  Diagnosis Date  . Diabetes mellitus (CMS-HCC)   . Hypertension     There is no problem list on file for this patient.   No past surgical history on file.  No current facility-administered  medications for this encounter.  Current Outpatient Medications:  .  metFORMIN  (GLUCOPHAGE ) 500 MG tablet, Take 500 mg by mouth daily., Disp: , Rfl:  .  olmesartan -hydrochlorothiazide  (BENICAR  HCT) 40-25 mg per tablet, Take 1 tablet by mouth daily., Disp: , Rfl:  .  sertraline  (ZOLOFT ) 100 MG tablet, Take 150 mg by mouth daily., Disp: , Rfl:   Allergies Patient has no allergy information on record.  History reviewed. No pertinent family history.  Social History Social History   Tobacco Use  . Smoking status: Never Smoker  . Smokeless tobacco: Not on file  Substance Use Topics  . Alcohol use: Yes    Comment: on occasion  . Drug use: Not Currently    Review of Systems  Constitutional: Negative for fever. Eyes: Negative for visual changes. ENT: Negative for sore throat. Cardiovascular: Negative for chest pain. Respiratory: Negative for shortness of breath or cough/congestion. Gastrointestinal: Negative for abdominal pain, nausea/vomiting or diarrhea. Genitourinary: Negative for dysuria or hematuria. Musculoskeletal: Positive for neck pain. Skin: Negative for rash. Neurological: Negative for headaches, lightheadedness/dizziness, focal weakness or numbness.  All  other systems have been reviewed and are negative except as otherwise documented.   Physical Exam   This provider entered the patient's room: Yes:  If this provider did not enter the room, a comprehensive physical exam was not able to be performed due to increased infection risk to themselves, other providers, staff and other patients), as well as to conserve personal protective equipment (PPE) utilization during the COVID-19 pandemic.  If this provider did enter the patient room, the following was PPE worn: Surgical mask, eye protection and gloves  ED Triage Vitals  Enc Vitals Group     BP 06/17/20 1645 221/116     Heart Rate 06/17/20 1645 75     SpO2 Pulse --      Resp 06/17/20 1645 18     Temp 06/17/20 1653  36.9 C (98.4 F)     Temp Source 06/17/20 1653 Oral     SpO2 06/17/20 1645 96 %   Constitutional: Alert and oriented. Well appearing and in no distress. Eyes: Conjunctivae are normal. ENT      Head: Normocephalic and atraumatic.      Nose: No congestion.      Mouth/Throat: Mucous membranes are dry.      Neck: No stridor. Hematological/Lymphatic/Immunilogical: No cervical lymphadenopathy. Cardiovascular: Normal rate, regular rhythm. Normal and symmetric distal pulses are present in all extremities.  Bilateral DPs 2+.  Nonpitting edema noted bilateral feet/ankles. Respiratory: Normal respiratory effort. Breath sounds are normal in all lobes. Gastrointestinal: Soft nondistended and nontender.  Genitourinary: No suprapubic TTP. Musculoskeletal: Normal range of motion in all extremities. Neurologic: Normal speech and language. No gross focal neurologic deficits are appreciated.  GCS 14.  PERRLA 2BR, CN II-XII intact.  No pronator drift, facial asymmetry, unilateral weakness, ataxia, aphasia or other speech abnormality noted.  BUE/BLE strength 5/5.  Slightly wide unsteady gait however not deviating unilaterally. Skin: Skin is warm, dry and intact. No rash noted. Psychiatric: Mood and affect are normal. Speech and behavior are normal.   EKG   No previous EKG on file for comparison  Normal rate, regular rhythm, 73 bpm Left axis deviation Normal intervals with borderline QT No significant ST elevation or depression   Radiology   CT head WO contrast  Final Result  No acute intracranial abnormality.    Advanced chronic microangiopathic white matter changes.        Procedures   n/a     [place] Rosaline Bonine, FNP 06/17/20 2104
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
## 2020-06-17 NOTE — ED Notes (Signed)
 Bed: 09  Expected date:   Expected time:   Means of arrival:   Comments:  EMS
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
## 2020-06-23 DIAGNOSIS — F0390 Unspecified dementia without behavioral disturbance: Secondary | ICD-10-CM | POA: Diagnosis not present

## 2020-06-23 DIAGNOSIS — I160 Hypertensive urgency: Secondary | ICD-10-CM | POA: Diagnosis not present

## 2020-06-23 DIAGNOSIS — I10 Essential (primary) hypertension: Secondary | ICD-10-CM | POA: Diagnosis not present

## 2020-06-23 DIAGNOSIS — R4182 Altered mental status, unspecified: Secondary | ICD-10-CM | POA: Diagnosis not present

## 2020-06-24 DIAGNOSIS — F0390 Unspecified dementia without behavioral disturbance: Secondary | ICD-10-CM | POA: Diagnosis not present

## 2020-06-24 DIAGNOSIS — F32A Depression, unspecified: Secondary | ICD-10-CM | POA: Diagnosis not present

## 2020-06-24 DIAGNOSIS — E119 Type 2 diabetes mellitus without complications: Secondary | ICD-10-CM | POA: Diagnosis not present

## 2020-06-24 DIAGNOSIS — I10 Essential (primary) hypertension: Secondary | ICD-10-CM | POA: Diagnosis not present

## 2020-07-01 NOTE — Nursing Note (Signed)
 Spiritual Care Visit Note  Assessment Summary: Ms. Brewton was sitting by the window with the TV on at full volume when I stopped by. I told her I stopped by to check on her. She says she is feeling good, and ready to go home. She appreciated the visit and turned her attention back towards the TV. I was able to provide a compassionate and non anxious presence. Pt is hopeful she will go home soon and said she didn't have any specific needs at this time.  Clinical Encounter Type Type of Visit: Follow-up visit Care Provided To: Patient Referral Source: Self-Referral On-Call Visit?: No Reason for Visit: Routine spiritual support Minutes Spent: 10 minutes     Spiritual Assessment Faith: No religion on file Presenting Concern(s): Meaning/Purpose,Isolation/Loneliness Beliefs: strong belief in her faith Emotions: anticipating going home Community: Faith community,Family Needs: be sure she has a safe place to go home to, is watched so she doesn't get hurt. Hopes: remains safe and cared for Resources: faith community and family (?)  Spiritual Care Interventions Interventions Made: Established relationship of care and support,Compassionate presence,Non-anxious presence Outcomes: Appreciated Chaplain visit,Open to continued care    Spiritual Care Plan Spiritual Care Plan: Continued care (Appreciates chaplain visits)    Signed: Austin Leavens, Chaplain 3:53 PM 07/01/2020

## 2020-07-01 NOTE — Nursing Note (Signed)
 Patient c/o diarrhea, refused senna and miralax . No other concerns or complaints at this time.

## 2020-07-02 NOTE — Progress Notes (Signed)
-------------------------------------------------------------------------------   Attestation signed by Cetrone, Emily Diane, MD at 07/02/20 1136 I saw and examined the patient concurrently  with the resident, reviewed the medical record and recent labs, discussed the case with the resident, and agree with the note, assessment, and plan as outlined in the  resident's note. Damien CORDOBA Cetrone, MD  -------------------------------------------------------------------------------  Med A Geriatrics Progress Note  Assessment & Plan:  Kirsten Kelly is a 86 y.o. female with a PMHx significant for HTN and T2DM presenting to [facility] ED via EMS with AMS in the setting of reportedly being a missing person for the last 3 days. Medically fit for discharge but awaiting niece to arrive and take pt home.  Principal Problem:   Moderate Dementia (CMS-HCC) Active Problems:   Altered mental status   Diabetes mellitus (CMS-HCC)   Hypertensive urgency   Hypertension Resolved Problems:   * No resolved hospital problems. *  AMS  Moderate Dementia  Disposition Baseline. - PT/OT - Delirium precautions - Started process to obtain guardianship for pt  Essential Hypertension Permissively hypertensive goal 160-180 SBP. Appears more hypertensive at night, switching to nightly regimen today.  - Cont losartan to 100mg   --Cont chlorthalidone 25mg  - Cont Amlodipine  10mg  - cont spironolactone 25mg   Constipation Had another BM overnight - Miralax  TID - Senna BID  Hypokalemia 3.2 this a.m.  - Repleted  Chronic Problems: Depression: zoloft  150mg  daily T2DM: restarted home metformin , A1C 8.3  Daily Checklist: Diet: Regular Diet DVT PPx: Not Indicated - Padua Score <4 Electrolytes: Replete PRN Code Status: Full Code Dispo: Waiting for 24/7 supervision before discharging to home  Team Contact Information:  Primary Team: Geriatrics (MEDA) Primary Resident: Beverley Come, MD/MBA Resident's Pager: [phone]  (Geriatrics Intern - [place])  Interval History:  No acute events overnight. Patient AAOx3 and resting comfortably up in chair. BM and urinating without complaint. Reports mild headache. Denies CP, stomach pain, SOB.   BP meds switched to nightly schedule as her hypertension appears worst at night. Night BP at 179/91, similarly to previous night.   All other systems were reviewed and are negative except as noted in the HPI and/or Interval History  Objective:   Vitals:   07/02/20 0839  BP: 170/79  Pulse: 52  Resp: 16  Temp: 36 C  SpO2: 98%     Intake/Output Summary (Last 24 hours) at 07/02/2020 1048 Last data filed at 07/01/2020 1842 Gross per 24 hour  Intake 240 ml  Output --  Net 240 ml   Gen: NAD, answers questions appropriately Eyes: sclera anicteric, EOMI HENT: atraumatic, normocephalic Heart: no chest wall tenderness Lungs: normal WOB, no use of accessory muscles,  Abdomen: soft, nontender, no rebound/guarding Extremities: no clubbing, cyanosis, or edema in the BLEs Psych: Alert, AOx3, appropriate mood and affect  Labs/Studies: Labs and Studies from the last 24hrs per EMR and Reviewed

## 2020-07-03 NOTE — Nursing Note (Signed)
 Pt BP elevated and HR is 58. Paged MD regarding giving BP meds for tonight with HR of 58. MD said OK to give all BP meds.     07/03/20 2031  Vitals  Temp 36.3 C (97.3 F)  Temp Source Oral  Heart Rate 58  Heart Rate Source Monitor  Resp 16  BP 198/91  MAP (mmHg) 131  BP Location Right arm  BP Method Automatic  Patient Position Lying  Oxygen Therapy  SpO2 98 %  O2 Device None (Room air)

## 2020-07-03 NOTE — Progress Notes (Signed)
-------------------------------------------------------------------------------   Attestation signed by Cetrone, Emily Diane, MD at 07/03/20 1537 I saw and examined the patient concurrently  with the resident, reviewed the medical record and recent labs, discussed the case with the resident, and agree with the note, assessment, and plan as outlined in the  resident's note. Damien CORDOBA Cetrone, MD  -------------------------------------------------------------------------------  Med A Geriatrics Progress Note  Assessment & Plan:  Kirsten Kelly is a 86 y.o. female with a PMHx significant for HTN and T2DM presenting to [facility] ED via EMS with AMS in the setting of reportedly being a missing person for the last 3 days. Medically fit for discharge but awaiting niece to arrive and take pt home.  Principal Problem:   Moderate Dementia (CMS-HCC) Active Problems:   Altered mental status   Diabetes mellitus (CMS-HCC)   Hypertensive urgency   Hypertension Resolved Problems:   * No resolved hospital problems. *  AMS  Moderate Dementia  Disposition Baseline. - PT/OT - Delirium precautions - Started process to obtain guardianship for pt  Essential Hypertension Permissively hypertensive goal 160-180 SBP. Appears more hypertensive at night, switching to nightly regimen.  - Cont losartan to 100mg   --Cont chlorthalidone 25mg  - Cont Amlodipine  10mg  - Increased spironolactone to 50mg   Constipation (Resolved) - Miralax   daily - Senna BID  Hypokalemia 3.2 this a.m.  - Repleted  Chronic Problems: Depression: zoloft  150mg  daily T2DM: restarted home metformin , A1C 8.3  Daily Checklist: Diet: Regular Diet DVT PPx: Not Indicated - Padua Score <4 Electrolytes: Replete PRN Code Status: Full Code Dispo: Waiting for 24/7 supervision before discharging to home  Team Contact Information:  Primary Team: Geriatrics (MEDA) Primary Resident: Patrcia Ramp, MD/MBA Resident's Pager: [phone] (Geriatrics  Intern - Blue)  Interval History:  No acute events overnight.    BP meds nightly schedule as her hypertension appears worst at night. Night BP at 179/91, similarly to previous night.   All other systems were reviewed and are negative except as noted in the HPI and/or Interval History  Objective:   Vitals:   07/03/20 0915  BP: 139/74  Pulse: 65  Resp: 19  Temp: 36.4 C  SpO2: 96%    No intake or output data in the 24 hours ending 07/03/20 1505 Gen: NAD, answers questions appropriately Eyes: sclera anicteric, EOMI HENT: atraumatic, normocephalic Heart: no chest wall tenderness Lungs: normal WOB, no use of accessory muscles,  Abdomen: soft, nontender, no rebound/guarding Extremities: no clubbing, cyanosis, or edema in the BLEs Psych: Alert, AOx3, appropriate mood and affect  Labs/Studies: Labs and Studies from the last 24hrs per EMR and Reviewed

## 2020-07-04 DIAGNOSIS — R4182 Altered mental status, unspecified: Secondary | ICD-10-CM | POA: Diagnosis not present

## 2020-07-04 DIAGNOSIS — I10 Essential (primary) hypertension: Secondary | ICD-10-CM | POA: Diagnosis not present

## 2020-07-04 DIAGNOSIS — E876 Hypokalemia: Secondary | ICD-10-CM | POA: Diagnosis not present

## 2020-07-04 NOTE — Progress Notes (Signed)
-------------------------------------------------------------------------------   Attestation signed by Leodis Lenis, MD at 07/04/20 1407 I saw and examined the patient concurrently  with the resident, reviewed the medical record and recent labs, discussed the case with the resident, and agree with the note, assessment, and plan as outlined in the  resident's note. Lenis Leodis, MD.  -------------------------------------------------------------------------------  Med A Geriatrics Progress Note  Assessment & Plan:  Kirsten Kelly is a 86 y.o. female with a PMHx significant for HTN and T2DM presenting to [facility] ED via EMS with AMS in the setting of reportedly being a missing person for the last 3 days. Medically fit for discharge but awaiting niece to arrive and take pt home.  Principal Problem:   Moderate Dementia (CMS-HCC) Active Problems:   Altered mental status   Diabetes mellitus (CMS-HCC)   Hypertensive urgency   Hypertension Resolved Problems:   * No resolved hospital problems. *  AMS  Moderate Dementia  Disposition Baseline. - PT/OT - Delirium precautions - Started process to obtain guardianship for pt  Essential Hypertension Permissively hypertensive goal 160-180 SBP. Appears more hypertensive at night, switched to nightly regimen, a.m. BP 155/73  - Cont losartan to 100mg   - Cont chlorthalidone 25mg  - Cont Amlodipine  10mg  - Cont spironolactone to 50mg   Constipation (Resolved) - Miralax   daily - Senna nightly  Hypokalemia 3.2 this a.m.  - Repleted  Chronic Problems: Depression: zoloft  150mg  daily T2DM: restarted home metformin , A1C 8.3  Daily Checklist: Diet: Regular Diet DVT PPx: Not Indicated - Padua Score <4 Electrolytes: Replete PRN Code Status: Full Code Dispo: Waiting for 24/7 supervision before discharging to home  Team Contact Information:  Primary Team: Geriatrics (MEDA) Primary Resident: Patrcia Ramp, MD/MBA Resident's Pager: [phone] (Geriatrics  Intern - Blue)  Interval History:  No acute events overnight. Still figuring out dispo as we proceed forward with obtaining a guardian.   BP meds nightly schedule as her hypertension appears worst at night. Night BP at 179/91, similarly to previous night.   All other systems were reviewed and are negative except as noted in the HPI and/or Interval History  Objective:   Vitals:   07/04/20 0809  BP: 155/73  Pulse: 58  Resp: 18  Temp: 36.8 C  SpO2: 97%    No intake or output data in the 24 hours ending 07/04/20 1241 Gen: NAD, answers questions appropriately Eyes: sclera anicteric, EOMI HENT: atraumatic, normocephalic Heart: no chest wall tenderness Lungs: normal WOB, no use of accessory muscles,  Abdomen: soft, nontender, no rebound/guarding Extremities: no clubbing, cyanosis, or edema in the BLEs Psych: Alert, AOx3, appropriate mood and affect  Labs/Studies: Labs and Studies from the last 24hrs per EMR and Reviewed

## 2020-07-04 NOTE — Care Plan (Signed)
 Plan of care reviewed with pt. Pt doing well, eating/drinking, does require assistance with ordering meals. Gets confused about the time of day, but is pleasantly confused. Will continue to monitor.   Problem: Adult Inpatient Plan of Care Goal: Plan of Care Review Outcome: Progressing Goal: Patient-Specific Goal (Individualized) Outcome: Progressing Goal: Absence of Hospital-Acquired Illness or Injury Outcome: Progressing Intervention: Identify and Manage Fall Risk Recent Flowsheet Documentation Taken 07/04/2020 0800 by Tawni LITTIE Lunger, BSN Safety Interventions: . fall reduction program maintained . nonskid shoes/slippers when out of bed Goal: Optimal Comfort and Wellbeing Outcome: Progressing Goal: Readiness for Transition of Care Outcome: Progressing Goal: Rounds/Family Conference Outcome: Progressing   Problem: Fall Injury Risk Goal: Absence of Fall and Fall-Related Injury Outcome: Progressing Intervention: Promote Retail buyer Documentation Taken 07/04/2020 0800 by Tawni LITTIE Lunger, BSN Safety Interventions: . fall reduction program maintained . nonskid shoes/slippers when out of bed   Problem: Diabetes Comorbidity Goal: Blood Glucose Level Within Targeted Range Outcome: Progressing   Problem: Hypertension Comorbidity Goal: Blood Pressure in Desired Range Outcome: Progressing   Problem: Self-Care Deficit Goal: Improved Ability to Complete Activities of Daily Living Outcome: Progressing

## 2020-07-05 DIAGNOSIS — E119 Type 2 diabetes mellitus without complications: Secondary | ICD-10-CM | POA: Diagnosis not present

## 2020-07-05 DIAGNOSIS — F0390 Unspecified dementia without behavioral disturbance: Secondary | ICD-10-CM | POA: Diagnosis not present

## 2020-07-05 DIAGNOSIS — I10 Essential (primary) hypertension: Secondary | ICD-10-CM | POA: Diagnosis not present

## 2020-07-05 DIAGNOSIS — R4182 Altered mental status, unspecified: Secondary | ICD-10-CM | POA: Diagnosis not present

## 2020-07-05 NOTE — Nursing Note (Signed)
   Care Management Reassessment   Estimated Discharge Date: 07/05/2020  Current Discharge Plan: Home with Home Health    Anticipated Changes: None  Coordination of Care and Care Progression Notes:   SW spoke to Rosetta this AM, she stated she will be at the hospital this morning to pick up patient.   Team aware. Meds are being sent to pharmacy.   Will provide list of caregiver support options.   SW will continue to follow for avoidable delays and opportunities for progression of care.

## 2020-07-05 NOTE — Care Plan (Signed)
 Patient is alert and oriented x3, but forgetful at times. Vital signs stable. All medications have been given per order. No complaints of pain or discomfort have been voiced. Patient has been ambulating in the room independently with a rolator. No falls or injuries this shift. Patient to discharge this morning with HCPOA. No PIV in place. Bedside table, call bell, and telephone are within reach. Bed in lowest position with wheels locked.   Problem: Adult Inpatient Plan of Care Goal: Plan of Care Review Outcome: Progressing

## 2020-07-05 NOTE — Discharge Summary (Signed)
 ------------------------------------------------------------------------------- Attestation signed by Leodis Lenis, MD at 07/05/20 1253 I was the supervising physician in the delivery of the service. Lenis Leodis, MD   ------------------------------------------------------------------------------- ------------------------------------------------------------------------------- Summary: Discharge Summary  -------------------------------------------------------------------------------     [facility] Discharge Summary   Identifying Information:  Kirsten Kelly 01-26-1928 [id number]  PCP: MONTIE GORMAN PIZZA, MD  Admit date: 06/17/2020   Discharge date: 07/05/2020   Discharge Attending Physician: Lenis Leodis, MD; (P): [phone]  Discharge to: Home with Home Health: Other: TBD  Discharge Diagnoses: Principal Problem:   Moderate Dementia (CMS-HCC) Active Problems:   Altered mental status   Diabetes mellitus (CMS-HCC)   Hypertensive urgency   Hypertension Resolved Problems:   * No resolved hospital problems. [facility] Course    You have a follow up appointment on 07-18-20 at 12:00 noon with Dr. PIZZA (your primary doctor). Please have Dr. PIZZA check a basic metabolic panel and check your blood pressure.   Please do not drive until you follow up with Dr. PIZZA Lanius also have a follow up visit with a Geriatric Medicine Doctor on 07-20-20 at 10:30. See the details below.    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 She was admitted to the hospital after she had been reported missing since November 16 by her neighbor and was subsequently found 11/19 in her car in the woods after having driven off the road and down an embankment and possibly stayed there for multiple days. She lives in a mobile home by herself with her dog. She mostly spends her days watching TV at home. She has a friend Teacher, music who checks on her about 2-3 times per week. She reports  independence with her ADLs and IADLs, but she receives some assistance with IADLs from Ms. Rolfe. On cognitive testing her scores are in the range of dementia. Additionally, it seems that her executive dysfunction and memory deficits have compromised her ability to function safely as evidenced by the events leading to her admission. Foreseeably, she will continue to require more supervision and assistance. She would benefit from establishing and HCPOA to assist with medical decision-making moving forward.   Geriatric ROS Vision impairment: Has glasses, needs new Rx.  Hearing impairment:  Mild Speech impairment: None  Swallowing impairment: None Urinary incontinence: Some deficits Fecal incontinence:  None  Constipation:  None Falls (in last 12 mths): >= 2 falls: no 1 injurious fall: no PHQ 2 0   Sleep disturbance:  None Unintentional weight loss: None Behavior disturbance:  None    *Refer below for the patient's functional / cognitive assessments, and advance care planning*   Admit Date: Functional Assessment  (DELETE THIS: refer to PT/OT notes)    ADLs:  IADLs:  Feeding: Independent Dressing: Independent Ambulation: Independent Toileting: Independent Bathing: Independent  Using the phone: Independent Shopping: Independent Meal preparation: Independent Medication mgmt: Independent Managing finances: Independent Housework: Scientist, product/process development (driving or navigating public transit): Independent   Living situation: Patient lives in own home with noone (lives alone).   Changes in ADLs during hospitalization:    Assistive devices: None  Additional services recommended at discharge:    Cognitive Assessment   Delirium Assessment: CAM (Confusion Assessment Method):   On discharge, the patient did not show evidence of delirium. .    Other cognitive assessment:  The patient scored 11/30 [facility] Mental Status Exam (SLUMS) which is considered to be  in the range of dementia. Given her executive dysfunction and memory deficits contributing to  the events prompting her admission, her cognitive deficits are clearing impacting her ability to function.  Advance Care Planning   The patient has NOT completed a living will, HCPOA or MOST form.   Code Status: Full Code Surrogate decision maker/Emergency Contact: Extended Emergency Contact Information Primary Emergency Contact: Rolfe Laundry Home Phone: [phone] Relation: Other    Procedures  None   No admission procedures for hospital encounter.  Discharge Day Services:  BP 155/70   Pulse 61   Temp 36.7 C (98.1 F) (Oral)   Resp 18   Ht 162.6 cm (5' 4)   Wt 78.9 kg (174 lb)   SpO2 96%   BMI 29.87 kg/m   Last 5 Recorded Weights   06/17/20 2209  Weight: 78.9 kg (174 lb)    Pt seen on the day of discharge and determined appropriate for discharge.  Condition at Discharge: good  Length of Discharge: I spent greater than 30 mins in the discharge of this patient.  Discharge Medication List:     Your Medication List    STOP taking these medications   olmesartan -hydrochlorothiazide  40-25 mg per tablet Commonly known as: BENICAR  HCT     START taking these medications   amLODIPine  10 MG tablet Commonly known as: NORVASC  Take 1 tablet (10 mg total) by mouth nightly.   cetirizine 10 MG tablet Commonly known as: ZyrTEC Take 1 tablet (10 mg total) by mouth daily. Start taking on: July 06, 2020   chlorthalidone 25 MG tablet Commonly known as: HYGROTON Take 1 tablet (25 mg total) by mouth nightly.   fluticasone propionate 50 mcg/actuation nasal spray Commonly known as: FLONASE Use 1 spray into each nostril daily. Start taking on: July 06, 2020   losartan 100 MG tablet Commonly known as: COZAAR Take 1 tablet (100 mg total) by mouth nightly.   magnesium oxide 400 mg (241.3 mg elemental) tablet Commonly known as: MAG-OX Take 1 tablet (400 mg total)  by mouth daily. Start taking on: July 06, 2020   polyethylene glycol 17 gram/dose powder Commonly known as: GLYCOLAX  Mix 17 grams (fill line in cap) in 4 to 8 ounces of beverage and drink daily   senna 8.6 mg tablet Commonly known as: SENOKOT Take 2 tablets by mouth nightly.   spironolactone 50 MG tablet Commonly known as: ALDACTONE Take 1 tablet (50 mg total) by mouth nightly.     CHANGE how you take these medications   sertraline  50 MG tablet Commonly known as: ZOLOFT  Take 3 tablets (150 mg total) by mouth daily. Start taking on: July 06, 2020 What changed: medication strength     CONTINUE taking these medications   metFORMIN  500 MG tablet Commonly known as: GLUCOPHAGE  Take 1 tablet (500 mg total) by mouth daily.         Pending Test Results (if blank, then none):   Most Recent Labs: Microbiology Results (last day)    ** No results found for the last 24 hours. **      Lab Results  Component Value Date   WBC 6.5 07/04/2020   HGB 13.3 07/04/2020   HCT 40.6 07/04/2020   PLT 169 07/04/2020    Lab Results  Component Value Date   NA 139 07/04/2020   K 3.5 07/04/2020   CL 104 07/04/2020   CO2 27.7 07/04/2020   BUN 16 07/04/2020   CREATININE 0.71 07/04/2020   CALCIUM 10.0 07/04/2020   MG 1.7 07/01/2020   PHOS 3.1 07/01/2020    Lab Results  Component Value Date   ALKPHOS 87 06/17/2020   BILITOT 0.9 06/17/2020   PROT 7.8 06/17/2020   ALBUMIN 3.6 06/17/2020   ALT 11 06/17/2020   AST 25 06/17/2020    Lab Results  Component Value Date   PT 13.4 06/17/2020   INR 1.15 06/17/2020   APTT 30.6 06/17/2020   Hospital Radiology: ECG 12 Lead  Result Date: 06/18/2020 NORMAL SINUS RHYTHM MODERATE VOLTAGE CRITERIA FOR LVH, MAY BE NORMAL VARIANT NONSPECIFIC T WAVE ABNORMALITY ABNORMAL ECG NO PREVIOUS ECGS AVAILABLE Confirmed by Quinton Goad [phone]) on 06/18/2020 7:54:28 PM  CT head WO contrast  Result Date: 06/17/2020 EXAM: Computed tomography,  head or brain without contrast material. DATE: 06/17/2020 5:08 PM ACCESSION: [id number] UN DICTATED: 06/17/2020 5:16 PM INTERPRETATION LOCATION: [facility] CLINICAL INDICATION: 86 years old Female with AMS ; Mental status change, unknown cause  COMPARISON: None TECHNIQUE: Axial CT images of the head  from skull base to vertex without contrast. FINDINGS: There is no midline shift. No mass lesion. There is no evidence of acute infarct. No acute intracranial hemorrhage. There are severe scattered and confluent periventricular hypodensities that are non-specific however consistent with chronic microvascular ischemic disease. There is generalized cerebral volume loss with sulcal prominence and ex vacuo ventricular dilation. No fractures are evident. The sinuses are pneumatized. Bilateral carotid siphon atherosclerotic ossifications.   No acute intracranial abnormality. Advanced chronic microangiopathic white matter changes.   Discharge Instructions:    Activity Instructions    Activity as tolerated     Do not drive until further discussion with your family physician      Diet Instructions    Discharge diet (specify)     Discharge Nutrition Therapy: Regular      Other Instructions    Discharge instructions     You will be notified by Dr. Montie Musca office for a primary care outpatient follow up time and date.   Please do not drive until you have discussed your safety behind the wheel with your primary doctor.   Dr. Teresa will check your blood work and blood pressure when you follow up with her.  Why was I admitted?   You have been admitted with confusion. This is from Alzheimer's Disease.   What should I watch for in the next few weeks?  Worsening confusion, weakness or dizziness   Who will I follow up with? We have scheduled you follow up with these doctors  - You will see your primary doctor (Dr. Teresa) in 1 to 2 weeks. They will contact you  - You also have a follow up visit  with a Geriatric Medicine Doctor on July 20, 2020 (see details below)    When you see your primary doctor, please get these labs drawn:  Basic Metabolic Panel   Were there any changes in my advance care planning?  No   It is important to discuss healthcare issues with your primary care doctor at an upcoming visit, so that your doctor knows what is important to you and what you would or would not want done if you were to become very ill  What should I do if I start to feel sick again?  You should call Dr. Musca office at [phone]  Medications: You will find and updated medication list attached to this information packet. If you have any questions or concerns about your medications, please call [phone] If you receive pre-packed Adherence Packaging (Bubble Packs) from your home pharmacy, please show this medication list to your home  pharmacist so that they can update any changes that have been made.  In general, if you have specific questions about these instructions or your hospital stay in the next 48 hours, please call [phone] and ask for the geriatrics doctor on call. Let them know that you were just discharged from the hospital.   For new symptoms or problems, call your primary care provider.   If you believe that this is a life-threatening emergency, call 911 or go to the emergency room.    Thank you for allowing us  to participate in your care.      Follow Up instructions and Outpatient Referrals    Discharge instructions     Ambulatory referral to Home Health     Is this a [facility] or [facility] Patient?: Yes   Home Health Options: Traditional Home Health   Is this patient at high risk for COVID 19 transmission and recommended to  stay at home during this pandemic?: No   If the patient has a diagnosis of heart failure and is already on oral  diuretics, do you want to activate the in home IV Lasix protocol?: No   Do you want to initiate remote  patient monitoring?: No   Physician to follow patient's care: Referring Provider   Disciplines requested:  Nursing Physical Therapy Occupational Therapy Medical Social Work     Nursing requested: Other: (please enter in comments) Comment - Medication  Management   Physical Therapy requested:  Home safety evaluation Ambulation training Strengthening exercises Evaluate and treat     Occupational Therapy Requested:  Home safety evaluation ADL or IADL training Cognitive training Strengthening exercises Evaluate and treat     Medical Social Work requested: General area of patient need   General area of patient need: Caregiver support   Do you want ongoing co-management?: No   Care coordination required?: No     Appointments which have been scheduled for you   Jul 20, 2020 10:30 AM (Arrive by 10:15 AM) RETURN  GENERAL with Max JULIANNA Dublin, MD [facility] [place] [facility]) [address] [place] [address] [phone]

## 2020-07-05 NOTE — Care Plan (Signed)
 Pt resting in bed, no signs of distress noted, VSS. No significant events overnight, pt rested well. No c/o pain, pt remains free of falls. Safety precautions in place, call bell within reach.    Problem: Adult Inpatient Plan of Care Goal: Plan of Care Review Outcome: Progressing Goal: Patient-Specific Goal (Individualized) Outcome: Progressing Goal: Absence of Hospital-Acquired Illness or Injury Outcome: Progressing Intervention: Identify and Manage Fall Risk Recent Flowsheet Documentation Taken 07/04/2020 2200 by Levon LITTIE Sharps, RN Safety Interventions: . fall reduction program maintained . low bed . nonskid shoes/slippers when out of bed Taken 07/04/2020 2000 by Levon LITTIE Sharps, RN Safety Interventions: . fall reduction program maintained . low bed . nonskid shoes/slippers when out of bed Intervention: Prevent and Manage VTE (Venous Thromboembolism) Risk Recent Flowsheet Documentation Taken 07/04/2020 2000 by Levon LITTIE Sharps, RN Activity Management: up ad lib Goal: Optimal Comfort and Wellbeing Outcome: Progressing Goal: Readiness for Transition of Care Outcome: Progressing Goal: Rounds/Family Conference Outcome: Progressing   Problem: Fall Injury Risk Goal: Absence of Fall and Fall-Related Injury Outcome: Progressing Intervention: Promote Injury-Free Environment Recent Flowsheet Documentation Taken 07/04/2020 2200 by Levon LITTIE Sharps, RN Safety Interventions: . fall reduction program maintained . low bed . nonskid shoes/slippers when out of bed Taken 07/04/2020 2000 by Levon LITTIE Sharps, RN Safety Interventions: . fall reduction program maintained . low bed . nonskid shoes/slippers when out of bed   Problem: Diabetes Comorbidity Goal: Blood Glucose Level Within Targeted Range Outcome: Progressing   Problem: Hypertension Comorbidity Goal: Blood Pressure in Desired Range Outcome: Progressing   Problem: Self-Care Deficit Goal: Improved Ability to Complete Activities of Daily  Living Outcome: Progressing

## 2020-07-05 NOTE — Care Plan (Signed)
  Care Management Final Transition Planning Assessment       Patient's Post Acute Contact Information: . There are no phone numbers on file. Extended Emergency Contact Information Primary Emergency Contact: Rolfe Laundry Home Phone: [phone] Mobile Phone: [phone] Relation: Other   Additional transition plan information: Pt will dc home with 24/7 support and Home Health  Has a PCP appointment been made?: No   Has a specialist appointment been made?: Yes  Future Appointments  Date Time Provider Department Center  07/20/2020 10:30 AM Max JULIANNA Dublin, MD [facility]      Post Acute Facility needed at discharge?: No        Home Care/ Home Medical Equipment needed at discharge?: Yes Home Care/ Home Medical Equipment: Home Health (specify)       [facility] - Admitted Since 06/17/2020    Service Provider Selected Services Address Phone Fax Patient Preferred   [facility] [address] [place], [place] [address] [phone] --       Outpatient/Community Referrals needed for discharge?: No       Transportation Anticipated: family or friend will provide    Currently receiving outpatient dialysis?: No       Discharge Disposition: Home w/ Home Health        Quality data for continuing care services shared with patient and/or representative?: Yes Patient and/or family were provided with choice of facilities / services that are available and appropriate to meet post hospital care needs?: N/A       Final Assessment Complete: Yes  Readmission Risk Score:  Predictive Model Details        10% (Low)  Factor Value   Calculated 07/05/2020 08:07 25% Current length of stay 14.807 days   [facility] Risk of Unplanned Readmission Model 17% Number of active Rx orders 14    16% ECG/EKG order present in last 6 months    13% Age 86    12% Imaging order present in last 6 months    10% Number of  ED visits in last six months 1    8% Charlson Comorbidity Index 4

## 2020-07-18 DIAGNOSIS — I10 Essential (primary) hypertension: Secondary | ICD-10-CM | POA: Diagnosis not present

## 2020-07-18 DIAGNOSIS — F321 Major depressive disorder, single episode, moderate: Secondary | ICD-10-CM | POA: Diagnosis not present

## 2020-07-18 DIAGNOSIS — F0390 Unspecified dementia without behavioral disturbance: Secondary | ICD-10-CM | POA: Diagnosis not present

## 2020-07-18 DIAGNOSIS — E785 Hyperlipidemia, unspecified: Secondary | ICD-10-CM | POA: Diagnosis not present

## 2020-07-18 DIAGNOSIS — E1169 Type 2 diabetes mellitus with other specified complication: Secondary | ICD-10-CM | POA: Diagnosis not present

## 2020-07-19 ENCOUNTER — Ambulatory Visit (INDEPENDENT_AMBULATORY_CARE_PROVIDER_SITE_OTHER): Payer: Medicare HMO | Admitting: Podiatry

## 2020-07-19 ENCOUNTER — Other Ambulatory Visit: Payer: Self-pay

## 2020-07-19 ENCOUNTER — Encounter: Payer: Self-pay | Admitting: Podiatry

## 2020-07-19 DIAGNOSIS — L600 Ingrowing nail: Secondary | ICD-10-CM | POA: Diagnosis not present

## 2020-07-19 DIAGNOSIS — L603 Nail dystrophy: Secondary | ICD-10-CM | POA: Diagnosis not present

## 2020-07-19 NOTE — Progress Notes (Signed)
Subjective:  Patient ID: Kirsten Kelly, female    DOB: [birth year],  MRN: [id number]  Chief Complaint  Patient presents with  . Nail Problem    Left hallux nail has some drainage and dried up blood on it.     86 y.o. female presents with the above complaint.  Patient presents with complaint of right hallux medial ingrown nail border.  Patient states that there is pain to it.  There is some blood and drainage associated with it.  Patient states this causes pain on the right foot.  Patient is here with her caregiver today.  She is not very communicative likely due to dementia.  However she is insistent that she would like to have the nail taken off is causing a lot of pain.  Patient is a diabetic who is well controlled.  She states her A1c has been around 6.5-6 7.0.   Review of Systems: Negative except as noted in the HPI. Denies N/V/F/Ch.  Past Medical History:  Diagnosis Date  . Arthritis   . Diabetes mellitus   . Hypertension     Current Outpatient Medications:  .  alendronate (FOSAMAX) 70 MG tablet, Take 70 mg by mouth once a week., Disp: , Rfl:  .  amLODipine (NORVASC) 10 MG tablet, Take 10 mg by mouth daily., Disp: , Rfl:  .  amLODipine (NORVASC) 5 MG tablet, , Disp: , Rfl:  .  aspirin 81 MG tablet, Take 81 mg by mouth daily., Disp: , Rfl:  .  atorvastatin (LIPITOR) 10 MG tablet, 1 TABLET ONCE A DAY ORALLY 90 DAYS, Disp: , Rfl: 3 .  carvedilol (COREG) 25 MG tablet, 1 TABLET TWICE A DAY ORALLY 90 DAYS, Disp: , Rfl: 1 .  carvedilol (COREG) 6.25 MG tablet, Take 6.25 mg by mouth 2 (two) times daily with a meal., Disp: , Rfl:  .  cetirizine (ZYRTEC) 10 MG tablet, Take 10 mg by mouth daily., Disp: , Rfl:  .  chlorthalidone (HYGROTON) 25 MG tablet, Take 25 mg by mouth daily., Disp: , Rfl:  .  [employer] SENNA 8.6 MG tablet, Take by mouth., Disp: , Rfl:  .  donepezil (ARICEPT) 5 MG tablet, Take 5 mg by mouth daily., Disp: , Rfl:  .  enalapril (VASOTEC) 20 MG tablet, Take 20 mg by mouth daily.   , Disp: , Rfl:  .  fluticasone (FLONASE) 50 MCG/ACT nasal spray, Place into both nostrils., Disp: , Rfl:  .  GAVILAX 17 GM/SCOOP powder, Take by mouth., Disp: , Rfl:  .  JANUMET XR 50-1000 MG TB24, 1 TABLET WITH EVENING MEAL ONCE A DAY ORALLY 90 DAYS, Disp: , Rfl: 1 .  KLOR-CON M20 20 MEQ tablet, TAKE 1 TABLET BY MOUTH EVERY DAY 90, Disp: , Rfl: 1 .  losartan (COZAAR) 100 MG tablet, Take 100 mg by mouth daily., Disp: , Rfl:  .  magnesium oxide (MAG-OX) 400 MG tablet, Take 1 tablet by mouth daily., Disp: , Rfl:  .  metFORMIN (GLUCOPHAGE) 500 MG tablet, Take 500 mg by mouth daily., Disp: , Rfl:  .  metFORMIN (GLUCOPHAGE-XR) 500 MG 24 hr tablet, , Disp: , Rfl:  .  olmesartan-hydrochlorothiazide (BENICAR HCT) 40-25 MG per tablet, Take 1 tablet by mouth daily. , Disp: , Rfl:  .  sertraline (ZOLOFT) 100 MG tablet, Take 100 mg by mouth daily., Disp: , Rfl: 1 .  sertraline (ZOLOFT) 50 MG tablet, Take 150 mg by mouth daily., Disp: , Rfl:  .  spironolactone (ALDACTONE) 50 MG  tablet, Take 50 mg by mouth daily., Disp: , Rfl:  .  tiZANidine (ZANAFLEX) 2 MG tablet, , Disp: , Rfl:  .  traMADol (ULTRAM) 50 MG tablet, Take 1 tablet (50 mg total) by mouth every 6 (six) hours as needed., Disp: 60 tablet, Rfl: 0  Social History   Tobacco Use  Smoking Status Never Smoker  Smokeless Tobacco Never Used    No Known Allergies Objective:  There were no vitals filed for this visit. There is no height or weight on file to calculate BMI. Constitutional Well developed. Well nourished.  Vascular Dorsalis pedis pulses palpable bilaterally. Posterior tibial pulses palpable bilaterally. Capillary refill normal to all digits.  No cyanosis or clubbing noted. Pedal hair growth normal.  Neurologic Normal speech. Oriented to person, place, and time. Epicritic sensation to light touch grossly present bilaterally.  Dermatologic Painful ingrowing nail at medial nail borders of the hallux nail right. No other open  wounds. No skin lesions.  Orthopedic: Normal joint ROM without pain or crepitus bilaterally. No visible deformities. No bony tenderness.   Radiographs: None Assessment:   1. Nail dystrophy   2. Ingrown right big toenail    Plan:  Patient was evaluated and treated and all questions answered.  Ingrown Nail, right -Patient elects to proceed with minor surgery to remove ingrown toenail removal today. Consent reviewed and signed by patient. -Ingrown nail excised. See procedure note. -Educated on post-procedure care including soaking. Written instructions provided and reviewed. -Patient to follow up in 2 weeks for nail check.  Procedure: Excision of Ingrown Toenail Location: Right 1st toe medial nail borders. Anesthesia: Lidocaine 1% plain; 1.5 mL and Marcaine 0.5% plain; 1.5 mL, digital block. Skin Prep: Betadine. Dressing: Silvadene; telfa; dry, sterile, compression dressing. Technique: Following skin prep, the toe was exsanguinated and a tourniquet was secured at the base of the toe. The affected nail border was freed, split with a nail splitter, and excised. Chemical matrixectomy was then performed with phenol and irrigated out with alcohol. The tourniquet was then removed and sterile dressing applied. Disposition: Patient tolerated procedure well. Patient to return in 2 weeks for follow-up.   No follow-ups on file.

## 2020-08-03 DIAGNOSIS — Z7984 Long term (current) use of oral hypoglycemic drugs: Secondary | ICD-10-CM | POA: Diagnosis not present

## 2020-08-03 DIAGNOSIS — F32A Depression, unspecified: Secondary | ICD-10-CM | POA: Diagnosis not present

## 2020-08-03 DIAGNOSIS — F0390 Unspecified dementia without behavioral disturbance: Secondary | ICD-10-CM | POA: Diagnosis not present

## 2020-08-03 DIAGNOSIS — E876 Hypokalemia: Secondary | ICD-10-CM | POA: Diagnosis not present

## 2020-08-03 DIAGNOSIS — I10 Essential (primary) hypertension: Secondary | ICD-10-CM | POA: Diagnosis not present

## 2020-08-03 DIAGNOSIS — E119 Type 2 diabetes mellitus without complications: Secondary | ICD-10-CM | POA: Diagnosis not present

## 2020-08-03 DIAGNOSIS — R32 Unspecified urinary incontinence: Secondary | ICD-10-CM | POA: Diagnosis not present

## 2020-08-08 DIAGNOSIS — E119 Type 2 diabetes mellitus without complications: Secondary | ICD-10-CM | POA: Diagnosis not present

## 2020-08-08 DIAGNOSIS — F32A Depression, unspecified: Secondary | ICD-10-CM | POA: Diagnosis not present

## 2020-08-08 DIAGNOSIS — I10 Essential (primary) hypertension: Secondary | ICD-10-CM | POA: Diagnosis not present

## 2020-08-08 DIAGNOSIS — F0390 Unspecified dementia without behavioral disturbance: Secondary | ICD-10-CM | POA: Diagnosis not present

## 2020-08-08 DIAGNOSIS — Z7984 Long term (current) use of oral hypoglycemic drugs: Secondary | ICD-10-CM | POA: Diagnosis not present

## 2020-08-08 DIAGNOSIS — E876 Hypokalemia: Secondary | ICD-10-CM | POA: Diagnosis not present

## 2020-08-08 DIAGNOSIS — R32 Unspecified urinary incontinence: Secondary | ICD-10-CM | POA: Diagnosis not present

## 2020-08-09 ENCOUNTER — Encounter: Payer: Self-pay | Admitting: Podiatry

## 2020-08-09 ENCOUNTER — Other Ambulatory Visit: Payer: Self-pay

## 2020-08-09 ENCOUNTER — Ambulatory Visit (INDEPENDENT_AMBULATORY_CARE_PROVIDER_SITE_OTHER): Payer: Medicare HMO | Admitting: Podiatry

## 2020-08-09 DIAGNOSIS — B351 Tinea unguium: Secondary | ICD-10-CM

## 2020-08-09 DIAGNOSIS — M79609 Pain in unspecified limb: Secondary | ICD-10-CM

## 2020-08-09 DIAGNOSIS — M2010 Hallux valgus (acquired), unspecified foot: Secondary | ICD-10-CM

## 2020-08-12 NOTE — Progress Notes (Signed)
Subjective:  Patient ID: Kirsten Kelly, female    DOB: Nov 29, 1927,  MRN: [id number]  Kirsten Kelly presents to clinic today for painful thick toenails that are difficult to trim. Pain interferes with ambulation. Aggravating factors include wearing enclosed shoe gear. Pain is relieved with periodic professional debridement..  87 y.o. female presents with the above complaint.    Review of Systems: Negative except as noted in the HPI. Past Medical History:  Diagnosis Date  . Arthritis   . Diabetes mellitus   . Hypertension    Past Surgical History:  Procedure Laterality Date  . ABDOMINAL HYSTERECTOMY    . ORIF HUMERUS FRACTURE Right 09/28/2016   Procedure: OPEN REDUCTION INTERNAL FIXATION (ORIF) PROXIMAL HUMERUS FRACTURE;  Surgeon: Eldred Manges, MD;  Location: [facility] OR;  Service: Orthopedics;  Laterality: Right;    Current Outpatient Medications:  .  alendronate (FOSAMAX) 70 MG tablet, Take 70 mg by mouth once a week., Disp: , Rfl:  .  amLODipine (NORVASC) 10 MG tablet, Take 10 mg by mouth daily., Disp: , Rfl:  .  amLODipine (NORVASC) 5 MG tablet, , Disp: , Rfl:  .  aspirin 81 MG tablet, Take 81 mg by mouth daily., Disp: , Rfl:  .  atorvastatin (LIPITOR) 10 MG tablet, 1 TABLET ONCE A DAY ORALLY 90 DAYS, Disp: , Rfl: 3 .  carvedilol (COREG) 25 MG tablet, 1 TABLET TWICE A DAY ORALLY 90 DAYS, Disp: , Rfl: 1 .  carvedilol (COREG) 6.25 MG tablet, Take 6.25 mg by mouth 2 (two) times daily with a meal., Disp: , Rfl:  .  cetirizine (ZYRTEC) 10 MG tablet, Take 10 mg by mouth daily., Disp: , Rfl:  .  chlorthalidone (HYGROTON) 25 MG tablet, Take 25 mg by mouth daily., Disp: , Rfl:  .  [employer] SENNA 8.6 MG tablet, Take by mouth., Disp: , Rfl:  .  donepezil (ARICEPT) 5 MG tablet, Take 5 mg by mouth daily., Disp: , Rfl:  .  enalapril (VASOTEC) 20 MG tablet, Take 20 mg by mouth daily.  , Disp: , Rfl:  .  fluticasone (FLONASE) 50 MCG/ACT nasal spray, Place into both nostrils., Disp: , Rfl:  .  GAVILAX 17  GM/SCOOP powder, Take by mouth., Disp: , Rfl:  .  JANUMET XR 50-1000 MG TB24, 1 TABLET WITH EVENING MEAL ONCE A DAY ORALLY 90 DAYS, Disp: , Rfl: 1 .  KLOR-CON M20 20 MEQ tablet, TAKE 1 TABLET BY MOUTH EVERY DAY 90, Disp: , Rfl: 1 .  losartan (COZAAR) 100 MG tablet, Take 100 mg by mouth daily., Disp: , Rfl:  .  magnesium oxide (MAG-OX) 400 MG tablet, Take 1 tablet by mouth daily., Disp: , Rfl:  .  metFORMIN (GLUCOPHAGE) 500 MG tablet, Take 500 mg by mouth daily., Disp: , Rfl:  .  metFORMIN (GLUCOPHAGE-XR) 500 MG 24 hr tablet, , Disp: , Rfl:  .  olmesartan-hydrochlorothiazide (BENICAR HCT) 40-25 MG per tablet, Take 1 tablet by mouth daily. , Disp: , Rfl:  .  sertraline (ZOLOFT) 100 MG tablet, Take 100 mg by mouth daily., Disp: , Rfl: 1 .  sertraline (ZOLOFT) 50 MG tablet, Take 150 mg by mouth daily., Disp: , Rfl:  .  spironolactone (ALDACTONE) 50 MG tablet, Take 50 mg by mouth daily., Disp: , Rfl:  .  tiZANidine (ZANAFLEX) 2 MG tablet, , Disp: , Rfl:  .  traMADol (ULTRAM) 50 MG tablet, Take 1 tablet (50 mg total) by mouth every 6 (six) hours as needed., Disp: 60  tablet, Rfl: 0 No Known Allergies Social History   Occupational History  . Not on file  Tobacco Use  . Smoking status: Never Smoker  . Smokeless tobacco: Never Used  Substance and Sexual Activity  . Alcohol use: Yes    Comment: socially  . Drug use: No  . Sexual activity: Not on file    Objective:   Constitutional Kirsten Kelly is a pleasant 87 y.o. African American female, WD, WN in NAD. AAO x 3.   Vascular Capillary refill time to digits immediate b/l. Palpable pedal pulses b/l LE. Pedal hair present. Lower extremity skin temperature gradient within normal limits. No edema noted b/l lower extremities. No cyanosis or clubbing noted.  Neurologic Normal speech. Oriented to person, place, and time. Protective sensation intact 5/5 intact bilaterally with 10g monofilament b/l. Vibratory sensation intact b/l. Clonus negative b/l.   Dermatologic Pedal skin with normal turgor, texture and tone bilaterally. No open wounds bilaterally. No interdigital macerations bilaterally. Toenails 1-5 b/l elongated, discolored, dystrophic, thickened, crumbly with subungual debris and tenderness to dorsal palpation.  Orthopedic: Normal muscle strength 5/5 to all lower extremity muscle groups bilaterally. No pain crepitus or joint limitation noted with ROM b/l. Hallux valgus with bunion deformity noted b/l lower extremities.   Radiographs: None Assessment:   1. Pain due to onychomycosis of nail   2. Acquired hallux valgus, unspecified laterality    Plan:  Patient was evaluated and treated and all questions answered.  Onychomycosis with pain -Nails palliatively debridement as below -Educated on self-care  Procedure: Nail Debridement Rationale: Pain Type of Debridement: manual, sharp debridement. Instrumentation: Nail nipper, rotary burr. Number of Nails: 10 -Examined patient. -Patient to continue soft, supportive shoe gear daily. -Toenails 1-5 b/l were debrided in length and girth with sterile nail nippers and dremel without iatrogenic bleeding.  -Patient to report any pedal injuries to medical professional immediately. -Patient/POA to call should there be question/concern in the interim.  Return in about 3 months (around 11/07/2020) for diabetic foot care.  Freddie Breech, DPM

## 2020-08-18 DIAGNOSIS — R32 Unspecified urinary incontinence: Secondary | ICD-10-CM | POA: Diagnosis not present

## 2020-08-18 DIAGNOSIS — E119 Type 2 diabetes mellitus without complications: Secondary | ICD-10-CM | POA: Diagnosis not present

## 2020-08-18 DIAGNOSIS — Z7984 Long term (current) use of oral hypoglycemic drugs: Secondary | ICD-10-CM | POA: Diagnosis not present

## 2020-08-18 DIAGNOSIS — E876 Hypokalemia: Secondary | ICD-10-CM | POA: Diagnosis not present

## 2020-08-18 DIAGNOSIS — I10 Essential (primary) hypertension: Secondary | ICD-10-CM | POA: Diagnosis not present

## 2020-08-18 DIAGNOSIS — F32A Depression, unspecified: Secondary | ICD-10-CM | POA: Diagnosis not present

## 2020-08-18 DIAGNOSIS — F0390 Unspecified dementia without behavioral disturbance: Secondary | ICD-10-CM | POA: Diagnosis not present

## 2020-08-19 DIAGNOSIS — E119 Type 2 diabetes mellitus without complications: Secondary | ICD-10-CM | POA: Diagnosis not present

## 2020-08-19 DIAGNOSIS — R32 Unspecified urinary incontinence: Secondary | ICD-10-CM | POA: Diagnosis not present

## 2020-08-19 DIAGNOSIS — I10 Essential (primary) hypertension: Secondary | ICD-10-CM | POA: Diagnosis not present

## 2020-08-19 DIAGNOSIS — Z7984 Long term (current) use of oral hypoglycemic drugs: Secondary | ICD-10-CM | POA: Diagnosis not present

## 2020-08-19 DIAGNOSIS — F0390 Unspecified dementia without behavioral disturbance: Secondary | ICD-10-CM | POA: Diagnosis not present

## 2020-08-19 DIAGNOSIS — F32A Depression, unspecified: Secondary | ICD-10-CM | POA: Diagnosis not present

## 2020-08-19 DIAGNOSIS — E876 Hypokalemia: Secondary | ICD-10-CM | POA: Diagnosis not present

## 2020-08-24 DIAGNOSIS — F32A Depression, unspecified: Secondary | ICD-10-CM | POA: Diagnosis not present

## 2020-08-24 DIAGNOSIS — R32 Unspecified urinary incontinence: Secondary | ICD-10-CM | POA: Diagnosis not present

## 2020-08-24 DIAGNOSIS — E119 Type 2 diabetes mellitus without complications: Secondary | ICD-10-CM | POA: Diagnosis not present

## 2020-08-24 DIAGNOSIS — E876 Hypokalemia: Secondary | ICD-10-CM | POA: Diagnosis not present

## 2020-08-24 DIAGNOSIS — F0390 Unspecified dementia without behavioral disturbance: Secondary | ICD-10-CM | POA: Diagnosis not present

## 2020-08-24 DIAGNOSIS — I10 Essential (primary) hypertension: Secondary | ICD-10-CM | POA: Diagnosis not present

## 2020-08-24 DIAGNOSIS — Z7984 Long term (current) use of oral hypoglycemic drugs: Secondary | ICD-10-CM | POA: Diagnosis not present

## 2020-08-30 DIAGNOSIS — R32 Unspecified urinary incontinence: Secondary | ICD-10-CM | POA: Diagnosis not present

## 2020-08-30 DIAGNOSIS — E119 Type 2 diabetes mellitus without complications: Secondary | ICD-10-CM | POA: Diagnosis not present

## 2020-08-30 DIAGNOSIS — E876 Hypokalemia: Secondary | ICD-10-CM | POA: Diagnosis not present

## 2020-08-30 DIAGNOSIS — F32A Depression, unspecified: Secondary | ICD-10-CM | POA: Diagnosis not present

## 2020-08-30 DIAGNOSIS — F0390 Unspecified dementia without behavioral disturbance: Secondary | ICD-10-CM | POA: Diagnosis not present

## 2020-08-30 DIAGNOSIS — I10 Essential (primary) hypertension: Secondary | ICD-10-CM | POA: Diagnosis not present

## 2020-08-30 DIAGNOSIS — Z7984 Long term (current) use of oral hypoglycemic drugs: Secondary | ICD-10-CM | POA: Diagnosis not present

## 2020-11-01 ENCOUNTER — Encounter: Payer: Self-pay | Admitting: Podiatry

## 2020-11-01 ENCOUNTER — Ambulatory Visit (INDEPENDENT_AMBULATORY_CARE_PROVIDER_SITE_OTHER): Payer: Medicare Other | Admitting: Podiatry

## 2020-11-01 ENCOUNTER — Other Ambulatory Visit: Payer: Self-pay

## 2020-11-01 DIAGNOSIS — M2011 Hallux valgus (acquired), right foot: Secondary | ICD-10-CM

## 2020-11-01 DIAGNOSIS — B351 Tinea unguium: Secondary | ICD-10-CM

## 2020-11-01 DIAGNOSIS — M79609 Pain in unspecified limb: Secondary | ICD-10-CM

## 2020-11-01 DIAGNOSIS — M2012 Hallux valgus (acquired), left foot: Secondary | ICD-10-CM

## 2020-11-01 DIAGNOSIS — E119 Type 2 diabetes mellitus without complications: Secondary | ICD-10-CM | POA: Diagnosis not present

## 2020-11-01 DIAGNOSIS — M2010 Hallux valgus (acquired), unspecified foot: Secondary | ICD-10-CM

## 2020-11-01 NOTE — Patient Instructions (Signed)
Call Dr. Lucilla Lame office and inform them your glucometer is broken.

## 2020-11-06 NOTE — Progress Notes (Signed)
ANNUAL DIABETIC FOOT EXAM  Subjective: Kirsten Kelly presents today for for annual diabetic foot examination and painful thick toenails that are difficult to trim. Pain interferes with ambulation. Aggravating factors include wearing enclosed shoe gear. Pain is relieved with periodic professional debridement..  Patient relates 10 year h/o diabetes.  Patient denies any h/o foot wounds.  Patient denies symptoms of foot numbness.  Patient denies symptoms of foot tingling.  Patient denies symptoms of burning in feet.  Patient denies symptoms of pins/needles in feet.  Patient did not check blood glucose this morning. She states she needs a new glucometer.  Kirsten Montana, MD is patient's PCP. Last visit was 08/21/2019.  No Known Allergies   Review of Systems: Negative except as noted in the HPI.  Objective:  There were no vitals filed for this visit.  Kirsten Kelly is a pleasant 87 y.o. female in NAD. AAO X 3.  Vascular Examination: Capillary refill time to digits immediate b/l. Palpable DP pulse(s) b/l lower extremities Faintly palpable PT pulse(s) b/l lower extremities. Pedal hair absent. Lower extremity skin temperature gradient within normal limits. No pain with calf compression b/l.  Dermatological Examination: Pedal skin with normal turgor, texture and tone bilaterally. No open wounds bilaterally. No interdigital macerations bilaterally. Toenails 1-5 b/l elongated, discolored, dystrophic, thickened, crumbly with subungual debris and tenderness to dorsal palpation.  Musculoskeletal Examination: Normal muscle strength 5/5 to all lower extremity muscle groups bilaterally. No pain crepitus or joint limitation noted with ROM b/l. Hallux valgus with bunion deformity noted b/l lower extremities.  Footwear Assessment: Does the patient wear appropriate shoes? Yes. Does the patient need inserts/orthotics? No.  Neurological Examination: Protective sensation intact 5/5 intact  bilaterally with 10g monofilament b/l. Vibratory sensation intact b/l.  Assessment: 1. Pain due to onychomycosis of nail   2. Acquired hallux valgus, unspecified laterality   3. Hallux valgus, acquired, bilateral   4. Type 2 diabetes mellitus without complication, without long-term current use of insulin (HCC)   5. Encounter for diabetic foot exam (HCC)     ADA Risk Categorization: Low Risk :  Patient has all of the following: Intact protective sensation No prior foot ulcer  No severe deformity Pedal pulses present  Plan: -Examined patient. -Diabetic foot examination performed on today's visit. -Patient to continue soft, supportive shoe gear daily. -Toenails 1-5 b/l were debrided in length and girth with sterile nail nippers and dremel without iatrogenic bleeding.  -Patient to report any pedal injuries to medical professional immediately. -Patient/POA to call should there be question/concern in the interim.  Return in about 3 months (around 01/31/2021).  Freddie Breech, DPM

## 2021-02-01 DIAGNOSIS — Z7984 Long term (current) use of oral hypoglycemic drugs: Secondary | ICD-10-CM | POA: Diagnosis not present

## 2021-02-01 DIAGNOSIS — J309 Allergic rhinitis, unspecified: Secondary | ICD-10-CM | POA: Diagnosis not present

## 2021-02-01 DIAGNOSIS — F0390 Unspecified dementia without behavioral disturbance: Secondary | ICD-10-CM | POA: Diagnosis not present

## 2021-02-01 DIAGNOSIS — E785 Hyperlipidemia, unspecified: Secondary | ICD-10-CM | POA: Diagnosis not present

## 2021-02-01 DIAGNOSIS — I10 Essential (primary) hypertension: Secondary | ICD-10-CM | POA: Diagnosis not present

## 2021-02-01 DIAGNOSIS — F325 Major depressive disorder, single episode, in full remission: Secondary | ICD-10-CM | POA: Diagnosis not present

## 2021-02-01 DIAGNOSIS — E1169 Type 2 diabetes mellitus with other specified complication: Secondary | ICD-10-CM | POA: Diagnosis not present

## 2021-02-08 ENCOUNTER — Ambulatory Visit: Payer: Medicare Other | Admitting: Podiatry

## 2021-02-08 ENCOUNTER — Other Ambulatory Visit: Payer: Self-pay

## 2021-02-08 ENCOUNTER — Encounter: Payer: Self-pay | Admitting: Podiatry

## 2021-02-08 DIAGNOSIS — E119 Type 2 diabetes mellitus without complications: Secondary | ICD-10-CM | POA: Diagnosis not present

## 2021-02-08 DIAGNOSIS — M79609 Pain in unspecified limb: Secondary | ICD-10-CM | POA: Diagnosis not present

## 2021-02-08 DIAGNOSIS — B351 Tinea unguium: Secondary | ICD-10-CM | POA: Diagnosis not present

## 2021-02-11 NOTE — Progress Notes (Signed)
Subjective: Kirsten Kelly is a pleasant 87 y.o. female patient seen today painful thick toenails that are difficult to trim. Pain interferes with ambulation. Aggravating factors include wearing enclosed shoe gear. Pain is relieved with periodic professional debridement.  Patient is diabetic and blood glucose is not monitored daily. She also has h/o dementia.  Caregiver is present during today's visit. They voice no new pedal problems on today's visit.  PCP is Laurann Montana, MD. Last visit was: 08/03/2020.  No Known Allergies  Objective: Physical Exam  General: Kirsten Kelly is a pleasant 87 y.o. African American female, WD, WN in NAD. AAO x 3.   Vascular:  Capillary refill time to digits immediate b/l. Palpable DP pulse(s) b/l lower extremities Faintly palpable PT pulse(s) b/l lower extremities. Pedal hair absent. Lower extremity skin temperature gradient within normal limits. No pain with calf compression b/l.  Dermatological:  Pedal skin with normal turgor, texture and tone b/l lower extremities. No open wounds b/l lower extremities. No interdigital macerations b/l lower extremities. Toenails 1-5 b/l elongated, discolored, dystrophic, thickened, crumbly with subungual debris and tenderness to dorsal palpation.  Musculoskeletal:  Normal muscle strength 5/5 to all lower extremity muscle groups bilaterally. No pain crepitus or joint limitation noted with ROM b/l. Hallux valgus with bunion deformity noted b/l lower extremities.  Neurological:  Protective sensation intact 5/5 intact bilaterally with 10g monofilament b/l.  Assessment and Plan:  1. Pain due to onychomycosis of nail   2. Type 2 diabetes mellitus without complication, without long-term current use of insulin (HCC)     -Examined patient. -Patient to continue soft, supportive shoe gear daily. -Toenails 1-5 b/l were debrided in length and girth with sterile nail nippers and dremel without iatrogenic bleeding.  -Patient  to report any pedal injuries to medical professional immediately. -Patient/POA to call should there be question/concern in the interim.  Return in about 3 months (around 05/11/2021).  Freddie Breech, DPM

## 2021-05-17 ENCOUNTER — Ambulatory Visit: Payer: Medicare HMO | Admitting: Podiatry

## 2021-07-05 ENCOUNTER — Ambulatory Visit: Payer: Medicare HMO | Admitting: Podiatry

## 2021-07-11 ENCOUNTER — Other Ambulatory Visit: Payer: Self-pay

## 2021-07-11 ENCOUNTER — Ambulatory Visit: Payer: Medicare HMO | Admitting: Podiatry

## 2021-07-11 ENCOUNTER — Encounter: Payer: Self-pay | Admitting: Podiatry

## 2021-07-11 DIAGNOSIS — L603 Nail dystrophy: Secondary | ICD-10-CM | POA: Diagnosis not present

## 2021-07-11 DIAGNOSIS — B351 Tinea unguium: Secondary | ICD-10-CM | POA: Diagnosis not present

## 2021-07-11 DIAGNOSIS — E119 Type 2 diabetes mellitus without complications: Secondary | ICD-10-CM | POA: Diagnosis not present

## 2021-07-11 DIAGNOSIS — M79609 Pain in unspecified limb: Secondary | ICD-10-CM

## 2021-07-11 NOTE — Progress Notes (Signed)
Subjective: Kirsten Kelly is a pleasant 87 y.o. female patient seen today painful thick toenails that are difficult to trim. Pain interferes with ambulation. Aggravating factors include wearing enclosed shoe gear. Pain is relieved with periodic professional debridement.  Patient is diabetic and blood glucose is not monitored daily. She also has h/o dementia.  Caregiver is present during today's visit. They voice no new pedal problems on today's visit.  PCP is Laurann Montana, MD. Last visit was: 08/03/2020.  No Known Allergies  Objective: Physical Exam  General: Kirsten Kelly is a pleasant 87 y.o. African American female, WD, WN in NAD. AAO x 3.   Vascular:  Capillary refill time to digits immediate b/l. Palpable DP pulse(s) b/l lower extremities Faintly palpable PT pulse(s) b/l lower extremities. Pedal hair absent. Lower extremity skin temperature gradient within normal limits. No pain with calf compression b/l.  Dermatological:  Pedal skin with normal turgor, texture and tone b/l lower extremities. No open wounds b/l lower extremities. No interdigital macerations b/l lower extremities. Toenails 1-5 b/l elongated, discolored, dystrophic, thickened, crumbly with subungual debris and tenderness to dorsal palpation.  Musculoskeletal:  Normal muscle strength 5/5 to all lower extremity muscle groups bilaterally. No pain crepitus or joint limitation noted with ROM b/l. Hallux valgus with bunion deformity noted b/l lower extremities.  Neurological:  Protective sensation intact 5/5 intact bilaterally with 10g monofilament b/l.  Assessment and Plan:  No diagnosis found.   -Examined patient. -Patient to continue soft, supportive shoe gear daily. -Toenails 1-5 b/l were debrided in length and girth with sterile nail nippers and dremel without iatrogenic bleeding.  -Patient to report any pedal injuries to medical professional immediately. -Patient/POA to call should there be  question/concern in the interim.  No follow-ups on file.  Louann Sjogren, MD

## 2021-09-18 DIAGNOSIS — F0390 Unspecified dementia without behavioral disturbance: Secondary | ICD-10-CM | POA: Diagnosis not present

## 2021-09-18 DIAGNOSIS — E1169 Type 2 diabetes mellitus with other specified complication: Secondary | ICD-10-CM | POA: Diagnosis not present

## 2021-09-18 DIAGNOSIS — R21 Rash and other nonspecific skin eruption: Secondary | ICD-10-CM | POA: Diagnosis not present

## 2021-09-18 DIAGNOSIS — E785 Hyperlipidemia, unspecified: Secondary | ICD-10-CM | POA: Diagnosis not present

## 2021-09-18 DIAGNOSIS — F325 Major depressive disorder, single episode, in full remission: Secondary | ICD-10-CM | POA: Diagnosis not present

## 2021-09-18 DIAGNOSIS — I10 Essential (primary) hypertension: Secondary | ICD-10-CM | POA: Diagnosis not present

## 2021-09-18 DIAGNOSIS — Z7984 Long term (current) use of oral hypoglycemic drugs: Secondary | ICD-10-CM | POA: Diagnosis not present

## 2022-01-08 ENCOUNTER — Ambulatory Visit (INDEPENDENT_AMBULATORY_CARE_PROVIDER_SITE_OTHER): Payer: BC Managed Care – PPO | Admitting: Podiatry

## 2022-01-08 ENCOUNTER — Encounter: Payer: Self-pay | Admitting: Podiatry

## 2022-01-08 DIAGNOSIS — M2010 Hallux valgus (acquired), unspecified foot: Secondary | ICD-10-CM | POA: Diagnosis not present

## 2022-01-08 DIAGNOSIS — B351 Tinea unguium: Secondary | ICD-10-CM | POA: Diagnosis not present

## 2022-01-08 DIAGNOSIS — M79609 Pain in unspecified limb: Secondary | ICD-10-CM

## 2022-01-08 DIAGNOSIS — E119 Type 2 diabetes mellitus without complications: Secondary | ICD-10-CM

## 2022-01-08 DIAGNOSIS — M2012 Hallux valgus (acquired), left foot: Secondary | ICD-10-CM

## 2022-01-08 DIAGNOSIS — M2011 Hallux valgus (acquired), right foot: Secondary | ICD-10-CM

## 2022-01-08 NOTE — Progress Notes (Signed)
This patient returns to my office for at risk foot care.  This patient requires this care by a professional since this patient will be at risk due to having diabetes mellitus.    This patient is unable to cut nails herself since the patient cannot reach her nails.These nails are painful walking and wearing shoes.  This patient presents for at risk foot care today.   General Appearance  Alert, conversant and in no acute stress.  Vascular  Dorsalis pedis and posterior tibial  pulses are palpable  bilaterally.  Capillary return is within normal limits  bilaterally. Temperature is within normal limits  bilaterally.  Neurologic  Senn-Weinstein monofilament wire test diminished   bilaterally. Muscle power within normal limits bilaterally.  Nails Thick disfigured discolored nails with subungual debris  from hallux to fifth toes bilaterally. No evidence of bacterial infection or drainage bilaterally.  Orthopedic  No limitations of motion  feet .  No crepitus or effusions noted.  No bony pathology or digital deformities noted.  HAV  B/L.  Skin  normotropic skin with no porokeratosis noted bilaterally.  No signs of infections or ulcers noted.     Onychomycosis  Pain in right toes  Pain in left toes  Consent was obtained for treatment procedures.   Mechanical debridement of nails 1-5  bilaterally performed with a nail nipper.  Filed with dremel without incident.    Return office visit   4 months                 Told patient to return for periodic foot care and evaluation due to potential at risk complications.   Kiren Mcisaac DPM  

## 2022-05-14 ENCOUNTER — Ambulatory Visit: Payer: Medicare (Managed Care) | Admitting: Podiatry

## 2022-05-23 ENCOUNTER — Ambulatory Visit (INDEPENDENT_AMBULATORY_CARE_PROVIDER_SITE_OTHER): Payer: Medicare (Managed Care) | Admitting: Podiatry

## 2022-05-23 ENCOUNTER — Encounter: Payer: Self-pay | Admitting: Podiatry

## 2022-05-23 DIAGNOSIS — M79676 Pain in unspecified toe(s): Secondary | ICD-10-CM

## 2022-05-23 DIAGNOSIS — M79609 Pain in unspecified limb: Secondary | ICD-10-CM

## 2022-05-23 DIAGNOSIS — B351 Tinea unguium: Secondary | ICD-10-CM

## 2022-05-23 DIAGNOSIS — M2010 Hallux valgus (acquired), unspecified foot: Secondary | ICD-10-CM

## 2022-05-23 DIAGNOSIS — E119 Type 2 diabetes mellitus without complications: Secondary | ICD-10-CM | POA: Diagnosis not present

## 2022-05-23 NOTE — Progress Notes (Signed)
This patient returns to my office for at risk foot care.  This patient requires this care by a professional since this patient will be at risk due to having diabetes mellitus.    This patient is unable to cut nails herself since the patient cannot reach her nails.These nails are painful walking and wearing shoes.  This patient presents for at risk foot care today.   General Appearance  Alert, conversant and in no acute stress.  Vascular  Dorsalis pedis and posterior tibial  pulses are palpable  bilaterally.  Capillary return is within normal limits  bilaterally. Temperature is within normal limits  bilaterally.  Neurologic  Senn-Weinstein monofilament wire test diminished   bilaterally. Muscle power within normal limits bilaterally.  Nails Thick disfigured discolored nails with subungual debris  from hallux to fifth toes bilaterally. No evidence of bacterial infection or drainage bilaterally.  Orthopedic  No limitations of motion  feet .  No crepitus or effusions noted.  No bony pathology or digital deformities noted.  HAV  B/L.  Skin  normotropic skin with no porokeratosis noted bilaterally.  No signs of infections or ulcers noted.     Onychomycosis  Pain in right toes  Pain in left toes  Consent was obtained for treatment procedures.   Mechanical debridement of nails 1-5  bilaterally performed with a nail nipper.  Filed with dremel without incident.    Return office visit   4 months                 Told patient to return for periodic foot care and evaluation due to potential at risk complications.   Gardiner Barefoot DPM

## 2022-10-11 ENCOUNTER — Emergency Department (HOSPITAL_COMMUNITY): Payer: Medicare HMO

## 2022-10-11 ENCOUNTER — Inpatient Hospital Stay (HOSPITAL_COMMUNITY)
Admission: EM | Admit: 2022-10-11 | Discharge: 2022-10-15 | DRG: 558 | Disposition: A | Discharge status: Home Health | Payer: Medicare HMO | Attending: Internal Medicine | Admitting: Internal Medicine

## 2022-10-11 DIAGNOSIS — W1809XA Striking against other object with subsequent fall, initial encounter: Secondary | ICD-10-CM | POA: Diagnosis present

## 2022-10-11 DIAGNOSIS — Y9301 Activity, walking, marching and hiking: Secondary | ICD-10-CM | POA: Diagnosis present

## 2022-10-11 DIAGNOSIS — F39 Unspecified mood [affective] disorder: Secondary | ICD-10-CM | POA: Diagnosis present

## 2022-10-11 DIAGNOSIS — M1990 Unspecified osteoarthritis, unspecified site: Secondary | ICD-10-CM | POA: Diagnosis present

## 2022-10-11 DIAGNOSIS — Z79899 Other long term (current) drug therapy: Secondary | ICD-10-CM | POA: Diagnosis not present

## 2022-10-11 DIAGNOSIS — Z90710 Acquired absence of both cervix and uterus: Secondary | ICD-10-CM

## 2022-10-11 DIAGNOSIS — Z7983 Long term (current) use of bisphosphonates: Secondary | ICD-10-CM

## 2022-10-11 DIAGNOSIS — I159 Secondary hypertension, unspecified: Secondary | ICD-10-CM | POA: Diagnosis not present

## 2022-10-11 DIAGNOSIS — E119 Type 2 diabetes mellitus without complications: Secondary | ICD-10-CM

## 2022-10-11 DIAGNOSIS — R109 Unspecified abdominal pain: Secondary | ICD-10-CM | POA: Diagnosis not present

## 2022-10-11 DIAGNOSIS — F0390 Unspecified dementia without behavioral disturbance: Secondary | ICD-10-CM | POA: Diagnosis present

## 2022-10-11 DIAGNOSIS — M810 Age-related osteoporosis without current pathological fracture: Secondary | ICD-10-CM | POA: Diagnosis present

## 2022-10-11 DIAGNOSIS — I679 Cerebrovascular disease, unspecified: Secondary | ICD-10-CM | POA: Diagnosis not present

## 2022-10-11 DIAGNOSIS — W19XXXA Unspecified fall, initial encounter: Principal | ICD-10-CM

## 2022-10-11 DIAGNOSIS — D72829 Elevated white blood cell count, unspecified: Secondary | ICD-10-CM | POA: Diagnosis present

## 2022-10-11 DIAGNOSIS — R531 Weakness: Secondary | ICD-10-CM | POA: Diagnosis not present

## 2022-10-11 DIAGNOSIS — E785 Hyperlipidemia, unspecified: Secondary | ICD-10-CM | POA: Diagnosis present

## 2022-10-11 DIAGNOSIS — Y92009 Unspecified place in unspecified non-institutional (private) residence as the place of occurrence of the external cause: Secondary | ICD-10-CM

## 2022-10-11 DIAGNOSIS — Z7982 Long term (current) use of aspirin: Secondary | ICD-10-CM | POA: Diagnosis not present

## 2022-10-11 DIAGNOSIS — N179 Acute kidney failure, unspecified: Secondary | ICD-10-CM

## 2022-10-11 DIAGNOSIS — Z7984 Long term (current) use of oral hypoglycemic drugs: Secondary | ICD-10-CM | POA: Diagnosis not present

## 2022-10-11 DIAGNOSIS — M545 Low back pain, unspecified: Secondary | ICD-10-CM | POA: Diagnosis present

## 2022-10-11 DIAGNOSIS — E860 Dehydration: Secondary | ICD-10-CM | POA: Diagnosis present

## 2022-10-11 DIAGNOSIS — I493 Ventricular premature depolarization: Secondary | ICD-10-CM | POA: Diagnosis present

## 2022-10-11 DIAGNOSIS — K5900 Constipation, unspecified: Secondary | ICD-10-CM

## 2022-10-11 DIAGNOSIS — R7989 Other specified abnormal findings of blood chemistry: Secondary | ICD-10-CM | POA: Diagnosis present

## 2022-10-11 DIAGNOSIS — Z794 Long term (current) use of insulin: Secondary | ICD-10-CM | POA: Diagnosis not present

## 2022-10-11 DIAGNOSIS — M6282 Rhabdomyolysis: Secondary | ICD-10-CM | POA: Diagnosis present

## 2022-10-11 DIAGNOSIS — Z043 Encounter for examination and observation following other accident: Secondary | ICD-10-CM | POA: Diagnosis not present

## 2022-10-11 DIAGNOSIS — R55 Syncope and collapse: Secondary | ICD-10-CM | POA: Diagnosis present

## 2022-10-11 DIAGNOSIS — I10 Essential (primary) hypertension: Secondary | ICD-10-CM | POA: Insufficient documentation

## 2022-10-11 DIAGNOSIS — M47816 Spondylosis without myelopathy or radiculopathy, lumbar region: Secondary | ICD-10-CM | POA: Diagnosis not present

## 2022-10-11 LAB — CBC WITH DIFFERENTIAL/PLATELET
Abs Immature Granulocytes: 0.07 10*3/uL (ref 0.00–0.07)
Basophils Absolute: 0 10*3/uL (ref 0.0–0.1)
Basophils Relative: 0 %
Eosinophils Absolute: 0.2 10*3/uL (ref 0.0–0.5)
Eosinophils Relative: 1 %
HCT: 39.1 % (ref 36.0–46.0)
Hemoglobin: 12.6 g/dL (ref 12.0–15.0)
Immature Granulocytes: 1 %
Lymphocytes Relative: 11 %
Lymphs Abs: 1.4 10*3/uL (ref 0.7–4.0)
MCH: 29.2 pg (ref 26.0–34.0)
MCHC: 32.2 g/dL (ref 30.0–36.0)
MCV: 90.7 fL (ref 80.0–100.0)
Monocytes Absolute: 0.7 10*3/uL (ref 0.1–1.0)
Monocytes Relative: 6 %
Neutro Abs: 10.2 10*3/uL — ABNORMAL HIGH (ref 1.7–7.7)
Neutrophils Relative %: 81 %
Platelets: 174 10*3/uL (ref 150–400)
RBC: 4.31 MIL/uL (ref 3.87–5.11)
RDW: 13.3 % (ref 11.5–15.5)
WBC: 12.5 10*3/uL — ABNORMAL HIGH (ref 4.0–10.5)
nRBC: 0 % (ref 0.0–0.2)

## 2022-10-11 LAB — URINALYSIS, ROUTINE W REFLEX MICROSCOPIC
Bilirubin Urine: NEGATIVE
Glucose, UA: NEGATIVE mg/dL
Hgb urine dipstick: NEGATIVE
Ketones, ur: NEGATIVE mg/dL
Leukocytes,Ua: NEGATIVE
Nitrite: NEGATIVE
Protein, ur: NEGATIVE mg/dL
Specific Gravity, Urine: 1 — ABNORMAL LOW (ref 1.005–1.030)
pH: 6 (ref 5.0–8.0)

## 2022-10-11 LAB — BASIC METABOLIC PANEL
Anion gap: 12 (ref 5–15)
BUN: 31 mg/dL — ABNORMAL HIGH (ref 8–23)
CO2: 24 mmol/L (ref 22–32)
Calcium: 9.7 mg/dL (ref 8.9–10.3)
Chloride: 101 mmol/L (ref 98–111)
Creatinine, Ser: 1.42 mg/dL — ABNORMAL HIGH (ref 0.44–1.00)
GFR, Estimated: 35 mL/min — ABNORMAL LOW (ref >60)
Glucose, Bld: 139 mg/dL — ABNORMAL HIGH (ref 70–99)
Potassium: 4.6 mmol/L (ref 3.5–5.1)
Sodium: 137 mmol/L (ref 135–145)

## 2022-10-11 LAB — CK: Total CK: 450 U/L — ABNORMAL HIGH (ref 38–234)

## 2022-10-11 MED ORDER — LACTATED RINGERS IV BOLUS
1000.0000 mL | Freq: Once | INTRAVENOUS | Status: AC
  Administered 2022-10-11: 1000 mL via INTRAVENOUS

## 2022-10-11 NOTE — ED Provider Notes (Addendum)
[facility] Provider Note   CSN: [id number] Arrival date & time: 10/11/22  1738     History  Chief Complaint  Patient presents with   Kirsten Kelly is a 89 y.o. female.  With PMH of HTN, DM, arthritis brought in by EMS from home after being found on the ground from a possible fall and being called for a welfare check.  She was last seen by neighbors yesterday.  Patient tells me this morning when she was getting out of bed she tripped over her small dog and fell to the ground and could not get up. However, with EMS and when she first arrived she was unsure how she got to the ground or ended up there.   EMS arrived later after being called by neighbors when they saw that her door was open but had not seen her all day.  She denies any loss of consciousness or injury from the fall.  She denies any chest pain or shortness of breath or recent illness.  She says she has walked since the fall.  She denies  being on any blood thinners.   Fall       Home Medications Prior to Admission medications   Medication Sig Start Date End Date Taking? Authorizing Provider  alendronate (FOSAMAX) 70 MG tablet Take 70 mg by mouth once a week. 05/05/19   [provider]  amLODipine (NORVASC) 10 MG tablet Take 10 mg by mouth daily. 07/05/20   [provider]  amLODipine (NORVASC) 10 MG tablet Take by mouth. 07/05/20 07/05/21  [provider]  amLODipine (NORVASC) 5 MG tablet  06/18/18   [provider]  aspirin 81 MG tablet Take 81 mg by mouth daily.    [provider]  atorvastatin (LIPITOR) 10 MG tablet 1 TABLET ONCE A DAY ORALLY 90 DAYS 10/25/16   [provider]  carvedilol (COREG) 25 MG tablet 1 TABLET TWICE A DAY ORALLY 90 DAYS 10/25/16   [provider]  carvedilol (COREG) 6.25 MG tablet Take 6.25 mg by mouth 2 (two) times daily with a meal.    [provider]  cetirizine (ZYRTEC)  10 MG tablet Take 10 mg by mouth daily.    [provider]  cetirizine (ZYRTEC) 10 MG tablet Take 1 tablet by mouth daily. 07/06/20 07/06/21  [provider]  chlorthalidone (HYGROTON) 25 MG tablet Take 25 mg by mouth daily. 07/05/20   [provider]  chlorthalidone (HYGROTON) 25 MG tablet Take by mouth. 07/05/20   [provider]  [facility] SENNA 8.6 MG tablet Take by mouth. 07/05/20   [provider]  donepezil (ARICEPT) 5 MG tablet Take 5 mg by mouth daily. 07/18/20   [provider]  enalapril (VASOTEC) 20 MG tablet Take 20 mg by mouth daily.      [provider]  fluticasone (FLONASE) 50 MCG/ACT nasal spray Place into both nostrils. 07/05/20   [provider]  fluticasone (FLONASE) 50 MCG/ACT nasal spray Use 1 spray into each nostril daily. 07/06/20 07/06/21  [provider]  GAVILAX 17 GM/SCOOP powder Take by mouth. 07/05/20   [provider]  JANUMET XR 50-1000 MG TB24 1 TABLET WITH EVENING MEAL ONCE A DAY ORALLY 90 DAYS 10/12/16   [provider]  KLOR-CON M20 20 MEQ tablet TAKE 1 TABLET BY MOUTH EVERY DAY 90 10/25/16   [provider]  losartan (COZAAR) 100 MG tablet Take  100 mg by mouth daily. 07/05/20   [provider]  losartan (COZAAR) 100 MG tablet Take by mouth. 07/05/20   [provider]  magnesium oxide (MAG-OX) 400 MG tablet Take 1 tablet by mouth daily. 07/05/20   [provider]  metFORMIN (GLUCOPHAGE) 500 MG tablet Take 500 mg by mouth daily. 07/05/20   [provider]  metFORMIN (GLUCOPHAGE) 500 MG tablet Take 1 tablet by mouth daily. 07/05/20   [provider]  metFORMIN (GLUCOPHAGE-XR) 500 MG 24 hr tablet  06/18/18   [provider]  olmesartan-hydrochlorothiazide (BENICAR HCT) 40-25 MG per tablet Take 1 tablet by mouth daily.     [provider]  sertraline (ZOLOFT) 100 MG tablet Take 100 mg by mouth daily. 04/09/18    [provider]  sertraline (ZOLOFT) 50 MG tablet Take 150 mg by mouth daily. 07/05/20   [provider]  sertraline (ZOLOFT) 50 MG tablet Take by mouth. 07/06/20   [provider]  spironolactone (ALDACTONE) 50 MG tablet Take 50 mg by mouth daily. 07/05/20   [provider]  tiZANidine (ZANAFLEX) 2 MG tablet  06/18/18   [provider]  traMADol (ULTRAM) 50 MG tablet Take 1 tablet (50 mg total) by mouth every 6 (six) hours as needed. 10/01/16   Lanae Crumbly, PA-C      Allergies    Patient has no known allergies.    Review of Systems   Review of Systems  Physical Exam Updated Vital Signs BP (!) 127/59   Pulse 67   Temp 98.5 F (36.9 C) (Oral)   Resp 14   SpO2 94%  Physical Exam Constitutional: Alert and oriented. Pleasant elderly female, nontoxic Eyes: Conjunctivae are normal. ENT      Head: Normocephalic and atraumatic.      Mouth/Throat: Membranes are moist tongue is moist      Neck/spine: No midline tenderness step-off or deformity of C/T/L-spine Cardiovascular: S1, S2,  Normal and symmetric distal pulses are present in all extremities.Warm and well perfused. Respiratory: Normal respiratory effort. Breath sounds are normal.  O2 sat 97 on RA.  No chest wall crepitus or deformity or tenderness. Gastrointestinal: Soft and nontender.  Musculoskeletal: Normal range of motion in all extremities.  No pain with full range of motion of bilateral upper extremities.  No gross deformity noted of upper extremities.      Right lower leg: No tenderness or edema.      Left lower leg: No tenderness or edema. Neurologic: Normal speech and language.  No facial droop.  EOMI.  PERRL.  Equal strength bilateral upper and lower extremities.  Sensation grossly intact.  No gross focal neurologic deficits are appreciated. Skin: Skin is warm, dry  Psychiatric: Mood and affect are normal. Speech and behavior are normal.  ED Results / Procedures / Treatments    Labs (all labs ordered are listed, but only abnormal results are displayed) Labs Reviewed  BASIC METABOLIC PANEL - Abnormal; Notable for the following components:      Result Value   Glucose, Bld 139 (*)    BUN 31 (*)    Creatinine, Ser 1.42 (*)    GFR, Estimated 35 (*)    All other components within normal limits  CBC WITH DIFFERENTIAL/PLATELET - Abnormal; Notable for the following components:   WBC 12.5 (*)    Neutro Abs 10.2 (*)    All other components within normal limits  CK - Abnormal; Notable for the following components:   Total  CK 450 (*)    All other components within normal limits  URINALYSIS, ROUTINE W REFLEX MICROSCOPIC - Abnormal; Notable for the following components:   Color, Urine COLORLESS (*)    Specific Gravity, Urine 1.000 (*)    All other components within normal limits    EKG EKG Interpretation  Date/Time:  Thursday October 11 2022 18:37:51 EDT Ventricular Rate:  65 PR Interval:  195 QRS Duration: 94 QT Interval:  391 QTC Calculation: 407 R Axis:   -9 Text Interpretation: Sinus rhythm Ventricular premature complex Nonspecific T abnrm, anterolateral leads Improved from prior Confirmed by Georgina Snell [phone]) on 10/11/2022 6:39:52 PM  Radiology CT Head Wo Contrast  Result Date: 10/11/2022 CLINICAL DATA:  Found down EXAM: CT HEAD WITHOUT CONTRAST TECHNIQUE: Contiguous axial images were obtained from the base of the skull through the vertex without intravenous contrast. RADIATION DOSE REDUCTION: This exam was performed according to the departmental dose-optimization program which includes automated exposure control, adjustment of the mA and/or kV according to patient size and/or use of iterative reconstruction technique. COMPARISON:  09/21/2016 FINDINGS: Brain: There is no mass, hemorrhage or extra-axial collection. The size and configuration of the ventricles and extra-axial CSF spaces are normal. There is hypoattenuation of the white matter, most commonly  indicating chronic small vessel disease. Vascular: No abnormal hyperdensity of the major intracranial arteries or dural venous sinuses. No intracranial atherosclerosis. Skull: The visualized skull base, calvarium and extracranial soft tissues are normal. Sinuses/Orbits: No fluid levels or advanced mucosal thickening of the visualized paranasal sinuses. No mastoid or middle ear effusion. The orbits are normal. IMPRESSION: Chronic small vessel disease without acute intracranial abnormality. Electronically Signed   By: Ulyses Jarred M.D.   On: 10/11/2022 20:31   DG Chest Portable 1 View  Result Date: 10/11/2022 CLINICAL DATA:  [place], lower back pain EXAM: PORTABLE CHEST 1 VIEW COMPARISON:  09/21/2016 FINDINGS: Single frontal view of the chest demonstrates an unremarkable cardiac silhouette. There is stable prominence of the central pulmonary vasculature, without airspace disease, effusion, or pneumothorax. Atherosclerosis of the aortic arch. ORIF right humerus. No acute displaced fractures. IMPRESSION: 1. No acute intrathoracic process. 2. Chronic central vascular congestion. Electronically Signed   By: Randa Ngo M.D.   On: 10/11/2022 18:49   DG Pelvis Portable  Result Date: 10/11/2022 CLINICAL DATA:  [place], lower back pain EXAM: PORTABLE PELVIS 1-2 VIEWS COMPARISON:  08/07/2011 FINDINGS: Single frontal view of the pelvis includes both hips. There are no acute displaced fractures. Symmetrical bilateral hip osteoarthritis. Sacroiliac joints are unremarkable. Prominent degenerative changes within the lower lumbar spine. Soft tissues are unremarkable. IMPRESSION: 1. Degenerative changes as above.  No acute displaced fracture. Electronically Signed   By: Randa Ngo M.D.   On: 10/11/2022 18:48    Procedures Procedures    Medications Ordered in ED Medications  lactated ringers bolus 1,000 mL (1,000 mLs Intravenous New Bag/Given 10/11/22 2200)    ED Course/ Medical Decision Making/ A&P   {                             Medical Decision Making Kirsten Kelly is a 89 y.o. female.  With PMH of HTN, DM, arthritis brought in by EMS from home after being found on the ground from a fall and being called for a welfare check.  Patient reports having a mechanical fall over her dog getting out of her bed however there is concern there has been  syncope as she initially had no recollection of getting on the floor and EMS found patient on floor.  She is overall well-appearing on exam without any traumatic injuries notable on body exam, neurologically intact and hemodynamically stable.  EKG reviewed by me sinus rhythm with nonspecific T wave changes improved from prior EKGs.  No acute changes or new changes of ST/T concerning for ischemia.  Since patient was on the ground for approximately close to 12 hours, baseline labs obtained to ensure no evidence of rhabdomyolysis or other acute electrolyte abnormalities.  Chest x-ray, pelvis x-ray and CT head obtained to ensure no traumatic injury although no obvious injuries on exam.  CT head obtained which I personally reviewed no ICH.  Chest x-ray with no pneumonia.  Pelvis x-ray no traumatic injury.  Labs reviewed by me elevated creatinine 1.42 with elevated BUN 31 GFR down to 35.  This is increased from baseline but previous labs from 2021.  Also mild leukocytosis 12.5 with left shift.  No signs of UTI on UA.  Mild rhabdomyolysis CK4 50.  Patient started on fluids for mild rhabdomyolysis and AKI.  Due to questionable event today and possible syncope, discussed with hospitalist Dr Marlowe Sax for admission for further syncope workup continued fluids and Pt/OT for safe discharge.  Amount and/or Complexity of Data Reviewed Labs: ordered. Radiology: ordered.  Risk Decision regarding hospitalization.    Final Clinical Impression(s) / ED Diagnoses Final diagnoses:  Fall, initial encounter  Non-traumatic rhabdomyolysis  AKI (acute kidney injury) [facility])    Rx / [place]  Orders ED Discharge Orders     None         Elgie Congo, MD 10/11/22 2336    Elgie Congo, MD 10/11/22 2336

## 2022-10-11 NOTE — ED Triage Notes (Addendum)
Pt bib EMS coming from home. Neighbors report they saw pt front door open since yesterday and called for welfare check. When police arrived pt was alert lying on the ground. Pt does not recall how she fell. Denies pain. No blood thinners

## 2022-10-12 ENCOUNTER — Emergency Department (HOSPITAL_BASED_OUTPATIENT_CLINIC_OR_DEPARTMENT_OTHER): Payer: Medicare HMO

## 2022-10-12 ENCOUNTER — Observation Stay (HOSPITAL_COMMUNITY): Payer: Medicare HMO

## 2022-10-12 ENCOUNTER — Encounter (HOSPITAL_COMMUNITY): Payer: Self-pay | Admitting: Internal Medicine

## 2022-10-12 DIAGNOSIS — M47816 Spondylosis without myelopathy or radiculopathy, lumbar region: Secondary | ICD-10-CM | POA: Diagnosis not present

## 2022-10-12 DIAGNOSIS — M6282 Rhabdomyolysis: Secondary | ICD-10-CM

## 2022-10-12 DIAGNOSIS — M545 Low back pain, unspecified: Secondary | ICD-10-CM

## 2022-10-12 DIAGNOSIS — R531 Weakness: Secondary | ICD-10-CM | POA: Diagnosis not present

## 2022-10-12 DIAGNOSIS — R55 Syncope and collapse: Secondary | ICD-10-CM

## 2022-10-12 DIAGNOSIS — W19XXXA Unspecified fall, initial encounter: Secondary | ICD-10-CM

## 2022-10-12 DIAGNOSIS — R109 Unspecified abdominal pain: Secondary | ICD-10-CM | POA: Diagnosis not present

## 2022-10-12 DIAGNOSIS — E785 Hyperlipidemia, unspecified: Secondary | ICD-10-CM

## 2022-10-12 DIAGNOSIS — N179 Acute kidney failure, unspecified: Secondary | ICD-10-CM

## 2022-10-12 DIAGNOSIS — K5900 Constipation, unspecified: Secondary | ICD-10-CM

## 2022-10-12 LAB — CBC
HCT: 39.1 % (ref 36.0–46.0)
Hemoglobin: 12.4 g/dL (ref 12.0–15.0)
MCH: 29.2 pg (ref 26.0–34.0)
MCHC: 31.7 g/dL (ref 30.0–36.0)
MCV: 92.2 fL (ref 80.0–100.0)
Platelets: 171 10*3/uL (ref 150–400)
RBC: 4.24 MIL/uL (ref 3.87–5.11)
RDW: 13.6 % (ref 11.5–15.5)
WBC: 12.7 10*3/uL — ABNORMAL HIGH (ref 4.0–10.5)
nRBC: 0 % (ref 0.0–0.2)

## 2022-10-12 LAB — ECHOCARDIOGRAM COMPLETE
AR max vel: 2.31 cm2
AV Area VTI: 2.16 cm2
AV Area mean vel: 2.12 cm2
AV Mean grad: 3 mmHg
AV Peak grad: 6.2 mmHg
Ao pk vel: 1.24 m/s
Area-P 1/2: 3.27 cm2
Calc EF: 61.7 %
S' Lateral: 2.6 cm
Single Plane A2C EF: 55.4 %
Single Plane A4C EF: 62 %

## 2022-10-12 LAB — COMPREHENSIVE METABOLIC PANEL
ALT: 19 U/L (ref 0–44)
AST: 42 U/L — ABNORMAL HIGH (ref 15–41)
Albumin: 3.7 g/dL (ref 3.5–5.0)
Alkaline Phosphatase: 63 U/L (ref 38–126)
Anion gap: 9 (ref 5–15)
BUN: 27 mg/dL — ABNORMAL HIGH (ref 8–23)
CO2: 28 mmol/L (ref 22–32)
Calcium: 10 mg/dL (ref 8.9–10.3)
Chloride: 101 mmol/L (ref 98–111)
Creatinine, Ser: 1.47 mg/dL — ABNORMAL HIGH (ref 0.44–1.00)
GFR, Estimated: 33 mL/min — ABNORMAL LOW (ref >60)
Glucose, Bld: 112 mg/dL — ABNORMAL HIGH (ref 70–99)
Potassium: 4.4 mmol/L (ref 3.5–5.1)
Sodium: 138 mmol/L (ref 135–145)
Total Bilirubin: 1.2 mg/dL (ref 0.3–1.2)
Total Protein: 7.5 g/dL (ref 6.5–8.1)

## 2022-10-12 LAB — GLUCOSE, CAPILLARY
Glucose-Capillary: 109 mg/dL — ABNORMAL HIGH (ref 70–99)
Glucose-Capillary: 133 mg/dL — ABNORMAL HIGH (ref 70–99)

## 2022-10-12 LAB — CBG MONITORING, ED
Glucose-Capillary: 103 mg/dL — ABNORMAL HIGH (ref 70–99)
Glucose-Capillary: 97 mg/dL (ref 70–99)
Glucose-Capillary: 96 mg/dL (ref 70–99)

## 2022-10-12 LAB — HEMOGLOBIN A1C
Hgb A1c MFr Bld: 7 % — ABNORMAL HIGH (ref 4.8–5.6)
Mean Plasma Glucose: 154 mg/dL

## 2022-10-12 LAB — CK: Total CK: 501 U/L — ABNORMAL HIGH (ref 38–234)

## 2022-10-12 LAB — BRAIN NATRIURETIC PEPTIDE: B Natriuretic Peptide: 184.4 pg/mL — ABNORMAL HIGH (ref 0.0–100.0)

## 2022-10-12 MED ORDER — INSULIN ASPART 100 UNIT/ML IJ SOLN
0.0000 [IU] | Freq: Every day | INTRAMUSCULAR | Status: DC
  Administered 2022-10-13: 2 [IU] via SUBCUTANEOUS

## 2022-10-12 MED ORDER — INSULIN ASPART 100 UNIT/ML IJ SOLN
0.0000 [IU] | Freq: Three times a day (TID) | INTRAMUSCULAR | Status: DC
  Administered 2022-10-13 – 2022-10-15 (×5): 1 [IU] via SUBCUTANEOUS

## 2022-10-12 MED ORDER — SODIUM CHLORIDE 0.9 % IV SOLN: INTRAVENOUS | Status: AC

## 2022-10-12 NOTE — ED Notes (Signed)
ED TO INPATIENT HANDOFF REPORT  ED Nurse Name and Phone #: Effie Shy Name/Age/Gender Kirsten Kelly 89 y.o. female Room/Bed: 043C/043C  Code Status   Code Status: Full Code  Home/SNF/Other Home Patient oriented to: self and place Is this baseline? Yes   Triage Complete: Triage complete  Chief Complaint Syncope [R55]  Triage Note Pt bib EMS coming from home. Neighbors report they saw pt front door open since yesterday and called for welfare check. When police arrived pt was alert lying on the ground. Pt does not recall how she fell. Denies pain. No blood thinners   Allergies No Known Allergies  Level of Care/Admitting Diagnosis ED Disposition     ED Disposition  Admit   Condition  --   Comment  Hospital Area: [facility] [100100]  Level of Care: Telemetry Cardiac [103]  May place patient in observation at [facility] or [facility] if equivalent level of care is available:: Yes  Covid Evaluation: Asymptomatic - no recent exposure (last 10 days) testing not required  Diagnosis: Syncope [206001]  Admitting Physician: Shela Leff [id number]  Attending Physician: Shela Leff [id number]          B Medical/Surgery History Past Medical History:  Diagnosis Date   Arthritis    Diabetes mellitus    Hypertension    Past Surgical History:  Procedure Laterality Date   ABDOMINAL HYSTERECTOMY     ORIF HUMERUS FRACTURE Right 09/28/2016   Procedure: OPEN REDUCTION INTERNAL FIXATION (ORIF) PROXIMAL HUMERUS FRACTURE;  Surgeon: Marybelle Killings, MD;  Location: [place];  Service: Orthopedics;  Laterality: Right;     A IV Location/Drains/Wounds Patient Lines/Drains/Airways Status     Active Line/Drains/Airways     Name Placement date Placement time Site Days   Peripheral IV 10/11/22 20 G Right Forearm 10/11/22  1849  Forearm  1   Incision (Closed) 09/28/16 Shoulder Right 09/28/16  1119  -- 2205            Intake/Output Last 24 hours No  intake or output data in the 24 hours ending 10/12/22 1201  Labs/Imaging Results for orders placed or performed during the hospital encounter of 10/11/22 (from the past 48 hour(s))  Basic metabolic panel     Status: Abnormal   Collection Time: 10/11/22  6:00 PM  Result Value Ref Range   Sodium 137 135 - 145 mmol/L   Potassium 4.6 3.5 - 5.1 mmol/L   Chloride 101 98 - 111 mmol/L   CO2 24 22 - 32 mmol/L   Glucose, Bld 139 (H) 70 - 99 mg/dL    Comment: Glucose reference range applies only to samples taken after fasting for at least 8 hours.   BUN 31 (H) 8 - 23 mg/dL   Creatinine, Ser 1.42 (H) 0.44 - 1.00 mg/dL   Calcium 9.7 8.9 - 10.3 mg/dL   GFR, Estimated 35 (L) >60 mL/min    Comment: (NOTE) Calculated using the CKD-EPI Creatinine Equation (2021)    Anion gap 12 5 - 15    Comment: Performed at [facility] [address] [place], [place] [address]  CBC with Differential     Status: Abnormal   Collection Time: 10/11/22  6:00 PM  Result Value Ref Range   WBC 12.5 (H) 4.0 - 10.5 K/uL   RBC 4.31 3.87 - 5.11 MIL/uL   Hemoglobin 12.6 12.0 - 15.0 g/dL   HCT 39.1 36.0 - 46.0 %   MCV 90.7 80.0 - 100.0  fL   MCH 29.2 26.0 - 34.0 pg   MCHC 32.2 30.0 - 36.0 g/dL   RDW 13.3 11.5 - 15.5 %   Platelets 174 150 - 400 K/uL   nRBC 0.0 0.0 - 0.2 %   Neutrophils Relative % 81 %   Neutro Abs 10.2 (H) 1.7 - 7.7 K/uL   Lymphocytes Relative 11 %   Lymphs Abs 1.4 0.7 - 4.0 K/uL   Monocytes Relative 6 %   Monocytes Absolute 0.7 0.1 - 1.0 K/uL   Eosinophils Relative 1 %   Eosinophils Absolute 0.2 0.0 - 0.5 K/uL   Basophils Relative 0 %   Basophils Absolute 0.0 0.0 - 0.1 K/uL   Immature Granulocytes 1 %   Abs Immature Granulocytes 0.07 0.00 - 0.07 K/uL    Comment: Performed at [facility] [address] [place], [place] [address]  CK     Status: Abnormal   Collection Time: 10/11/22  6:00 PM  Result Value Ref Range   Total CK 450 (H) 38 - 234 U/L    Comment: Performed at [facility], [place] [address], [place], [place] [address]  Urinalysis, Routine w reflex microscopic -Urine, Clean Catch     Status: Abnormal   Collection Time: 10/11/22 10:01 PM  Result Value Ref Range   Color, Urine COLORLESS (A) YELLOW   APPearance CLEAR CLEAR   Specific Gravity, Urine 1.000 (L) 1.005 - 1.030   pH 6.0 5.0 - 8.0   Glucose, UA NEGATIVE NEGATIVE mg/dL   Hgb urine dipstick NEGATIVE NEGATIVE   Bilirubin Urine NEGATIVE NEGATIVE   Ketones, ur NEGATIVE NEGATIVE mg/dL   Protein, ur NEGATIVE NEGATIVE mg/dL   Nitrite NEGATIVE NEGATIVE   Leukocytes,Ua NEGATIVE NEGATIVE    Comment: Performed at [facility] [address] [place], [place] [address]  CBC     Status: Abnormal   Collection Time: 10/12/22  2:11 AM  Result Value Ref Range   WBC 12.7 (H) 4.0 - 10.5 K/uL   RBC 4.24 3.87 - 5.11 MIL/uL   Hemoglobin 12.4 12.0 - 15.0 g/dL   HCT 39.1 36.0 - 46.0 %   MCV 92.2 80.0 - 100.0 fL   MCH 29.2 26.0 - 34.0 pg   MCHC 31.7 30.0 - 36.0 g/dL   RDW 13.6 11.5 - 15.5 %   Platelets 171 150 - 400 K/uL   nRBC 0.0 0.0 - 0.2 %    Comment: Performed at [facility], [place] [address] [place], [place] [address]  Comprehensive metabolic panel     Status: Abnormal   Collection Time: 10/12/22  2:11 AM  Result Value Ref Range   Sodium 138 135 - 145 mmol/L   Potassium 4.4 3.5 - 5.1 mmol/L   Chloride 101 98 - 111 mmol/L   CO2 28 22 - 32 mmol/L   Glucose, Bld 112 (H) 70 - 99 mg/dL    Comment: Glucose reference range applies only to samples taken after fasting for at least 8 hours.   BUN 27 (H) 8 - 23 mg/dL   Creatinine, Ser 1.47 (H) 0.44 - 1.00 mg/dL   Calcium 10.0 8.9 - 10.3 mg/dL   Total Protein 7.5 6.5 - 8.1 g/dL   Albumin 3.7 3.5 - 5.0 g/dL   AST 42 (H) 15 - 41 U/L   ALT 19 0 - 44 U/L   Alkaline Phosphatase 63 38 - 126 U/L   Total Bilirubin 1.2 0.3 - 1.2 mg/dL  GFR, Estimated 33 (L) >60 mL/min    Comment: (NOTE) Calculated using the CKD-EPI Creatinine Equation (2021)     Anion gap 9 5 - 15    Comment: Performed at [facility], [place] [address], [place], [place] [address]  CK     Status: Abnormal   Collection Time: 10/12/22  2:11 AM  Result Value Ref Range   Total CK 501 (H) 38 - 234 U/L    Comment: Performed at [facility], [place] [address], [place], [place] [address]  Brain natriuretic peptide     Status: Abnormal   Collection Time: 10/12/22  2:11 AM  Result Value Ref Range   B Natriuretic Peptide 184.4 (H) 0.0 - 100.0 pg/mL    Comment: Performed at [facility] [address], [place], [place] [address]  CBG monitoring, ED     Status: Abnormal   Collection Time: 10/12/22  2:29 AM  Result Value Ref Range   Glucose-Capillary 103 (H) 70 - 99 mg/dL    Comment: Glucose reference range applies only to samples taken after fasting for at least 8 hours.  CBG monitoring, ED     Status: None   Collection Time: 10/12/22  8:06 AM  Result Value Ref Range   Glucose-Capillary 97 70 - 99 mg/dL    Comment: Glucose reference range applies only to samples taken after fasting for at least 8 hours.   VAS US CAROTID  Result Date: 10/12/2022 Carotid Arterial Duplex Study Patient Name:  Kirsten Kelly  Date of Exam:   10/12/2022 Medical Rec #: [id number]        Accession #:    [id number] Date of Birth: 26-Nov-1927        Patient Gender: F Patient Age:   41 years Exam Location:  [facility] Procedure:      VAS US CAROTID Referring Phys: Wandra Feinstein RATHORE --------------------------------------------------------------------------------  Indications:       Syncope and Weakness. Risk Factors:      Hypertension, hyperlipidemia, Diabetes. Comparison Study:  No prior study. Performing Technologist: McKayla Maag RVT, [place]  Examination Guidelines: A complete evaluation includes B-mode imaging, spectral Doppler, color Doppler, and power Doppler as needed of all accessible portions of each vessel. Bilateral testing is considered an integral part of a complete  examination. Limited examinations for reoccurring indications may be performed as noted.  Right Carotid Findings: +----------+--------+--------+--------+------------------+------------------+           PSV cm/sEDV cm/sStenosisPlaque DescriptionComments           +----------+--------+--------+--------+------------------+------------------+ CCA Prox  76      13                                intimal thickening +----------+--------+--------+--------+------------------+------------------+ CCA Distal45      9                                 intimal thickening +----------+--------+--------+--------+------------------+------------------+ ICA Prox  42      12      1-39%                     intimal thickening +----------+--------+--------+--------+------------------+------------------+ ICA Mid   76      18                                                   +----------+--------+--------+--------+------------------+------------------+  ICA Distal58      15                                                   +----------+--------+--------+--------+------------------+------------------+ ECA       121     12                                                   +----------+--------+--------+--------+------------------+------------------+ +----------+--------+-------+----------------+-------------------+           PSV cm/sEDV cmsDescribe        Arm Pressure (mmHG) +----------+--------+-------+----------------+-------------------+ [id number]            Multiphasic, WNL                    +----------+--------+-------+----------------+-------------------+ +---------+--------+--+--------+-+---------+ VertebralPSV cm/s33EDV cm/s7Antegrade +---------+--------+--+--------+-+---------+  Left Carotid Findings: +----------+--------+--------+--------+------------------+------------------+           PSV cm/sEDV cm/sStenosisPlaque DescriptionComments            +----------+--------+--------+--------+------------------+------------------+ CCA Prox  81      10                                                   +----------+--------+--------+--------+------------------+------------------+ CCA Distal63      12                                intimal thickening +----------+--------+--------+--------+------------------+------------------+ ICA Prox  58      8       1-39%                     intimal thickening +----------+--------+--------+--------+------------------+------------------+ ICA Mid   48      12                                                   +----------+--------+--------+--------+------------------+------------------+ ICA Distal54      13                                                   +----------+--------+--------+--------+------------------+------------------+ ECA       80      9                                                    +----------+--------+--------+--------+------------------+------------------+ +----------+--------+--------+----------------+-------------------+           PSV cm/sEDV cm/sDescribe        Arm Pressure (mmHG) +----------+--------+--------+----------------+-------------------+ [id number]              Multiphasic, WNL                    +----------+--------+--------+----------------+-------------------+ +---------+--------+--+--------+--+---------+  VertebralPSV cm/s79EDV cm/s14Antegrade +---------+--------+--+--------+--+---------+   Summary: Right Carotid: Velocities in the right ICA are consistent with a 1-39% stenosis. Left Carotid: Velocities in the left ICA are consistent with a 1-39% stenosis. Vertebrals:  Bilateral vertebral arteries demonstrate antegrade flow. Subclavians: Normal flow hemodynamics were seen in bilateral subclavian              arteries. *See table(s) above for measurements and observations.  Electronically signed by Servando Snare MD on 10/12/2022 at 10:42:24  AM.    Final    EEG adult  Result Date: 10/12/2022 Lora Havens, MD     10/12/2022  8:56 AM Patient Name: LENDSEY WEDEKING MRN: [id number] Epilepsy Attending: Lora Havens Referring Physician/Provider: Shela Leff, MD Date: 10/12/2022 Duration: 24.07 mins Patient history: 89yo F with syncope. EEG to evaluate for seizure Level of alertness: Awake AEDs during EEG study: None Technical aspects: This EEG study was done with scalp electrodes positioned according to the 10-20 International system of electrode placement. Electrical activity was reviewed with band pass filter of 1-70Hz , sensitivity of 7 uV/mm, display speed of 8mm/sec with a 60Hz  notched filter applied as appropriate. EEG data were recorded continuously and digitally stored.  Video monitoring was available and reviewed as appropriate. Description: The posterior dominant rhythm consists of 8 Hz activity of moderate voltage (25-35 uV) seen predominantly in posterior head regions, symmetric and reactive to eye opening and eye closing. Physiologic photic driving was seen during photic stimulation.  Hyperventilation was not performed.   IMPRESSION: This study is within normal limits. No seizures or epileptiform discharges were seen throughout the recording. A normal interictal EEG does not exclude the diagnosis of epilepsy. Lora Havens   DG Abd 1 View  Result Date: 10/12/2022 CLINICAL DATA:  [id number] Fall [id number] 91320 Back pain 91320 EXAM: ABDOMEN - 1 VIEW COMPARISON:  CT abdomen pelvis 08/07/2011 FINDINGS: The bowel gas pattern is normal. No radio-opaque calculi or other significant radiographic abnormality are seen. Degenerative changes of the lumbar spine. Degenerative changes of the hips. No acute osseous abnormality with limited evaluation on this frontal view. IMPRESSION: Nonobstructed bowel gas pattern. Electronically Signed   By: Iven Finn M.D.   On: 10/12/2022 02:50   DG Lumbar Spine 2-3 Views  Result Date:  10/12/2022 CLINICAL DATA:  Possible fall. EXAM: LUMBAR SPINE - 2-3 VIEW COMPARISON:  09/21/2016 FINDINGS: There is no evidence of acute lumbar spine fracture. Stable chronic loss of height in the superior endplate at L2 is noted. Alignment is normal. Multilevel intervertebral disc space narrowing, degenerative endplate changes, and facet arthropathy. IMPRESSION: 1. No acute fracture. 2. Multilevel degenerative changes. Electronically Signed   By: Brett Fairy M.D.   On: 10/12/2022 02:34   CT Head Wo Contrast  Result Date: 10/11/2022 CLINICAL DATA:  Found down EXAM: CT HEAD WITHOUT CONTRAST TECHNIQUE: Contiguous axial images were obtained from the base of the skull through the vertex without intravenous contrast. RADIATION DOSE REDUCTION: This exam was performed according to the departmental dose-optimization program which includes automated exposure control, adjustment of the mA and/or kV according to patient size and/or use of iterative reconstruction technique. COMPARISON:  09/21/2016 FINDINGS: Brain: There is no mass, hemorrhage or extra-axial collection. The size and configuration of the ventricles and extra-axial CSF spaces are normal. There is hypoattenuation of the white matter, most commonly indicating chronic small vessel disease. Vascular: No abnormal hyperdensity of the major intracranial arteries or dural venous sinuses. No intracranial atherosclerosis. Skull: The visualized skull base, calvarium and  extracranial soft tissues are normal. Sinuses/Orbits: No fluid levels or advanced mucosal thickening of the visualized paranasal sinuses. No mastoid or middle ear effusion. The orbits are normal. IMPRESSION: Chronic small vessel disease without acute intracranial abnormality. Electronically Signed   By: Ulyses Jarred M.D.   On: 10/11/2022 20:31   DG Chest Portable 1 View  Result Date: 10/11/2022 CLINICAL DATA:  [place], lower back pain EXAM: PORTABLE CHEST 1 VIEW COMPARISON:  09/21/2016 FINDINGS:  Single frontal view of the chest demonstrates an unremarkable cardiac silhouette. There is stable prominence of the central pulmonary vasculature, without airspace disease, effusion, or pneumothorax. Atherosclerosis of the aortic arch. ORIF right humerus. No acute displaced fractures. IMPRESSION: 1. No acute intrathoracic process. 2. Chronic central vascular congestion. Electronically Signed   By: Randa Ngo M.D.   On: 10/11/2022 18:49   DG Pelvis Portable  Result Date: 10/11/2022 CLINICAL DATA:  [place], lower back pain EXAM: PORTABLE PELVIS 1-2 VIEWS COMPARISON:  08/07/2011 FINDINGS: Single frontal view of the pelvis includes both hips. There are no acute displaced fractures. Symmetrical bilateral hip osteoarthritis. Sacroiliac joints are unremarkable. Prominent degenerative changes within the lower lumbar spine. Soft tissues are unremarkable. IMPRESSION: 1. Degenerative changes as above.  No acute displaced fracture. Electronically Signed   By: Randa Ngo M.D.   On: 10/11/2022 18:48    Pending Labs Unresulted Labs (From admission, onward)     Start     Ordered   10/12/22 0144  Hemoglobin A1c  Once,   R       Comments: To assess prior glycemic control    10/12/22 0148            Vitals/Pain Today's Vitals   10/12/22 0600 10/12/22 0614 10/12/22 0732 10/12/22 0800  BP: 136/69   (!) 136/93  Pulse: 66   64  Resp: 17   18  Temp:   98.4 F (36.9 C)   TempSrc:   Oral   SpO2: 95%   91%  PainSc:  Asleep      Isolation Precautions No active isolations  Medications Medications  insulin aspart (novoLOG) injection 0-9 Units ( Subcutaneous Not Given 10/12/22 0810)  insulin aspart (novoLOG) injection 0-5 Units ( Subcutaneous Not Given 10/12/22 0232)  0.9 %  sodium chloride infusion ( Intravenous New Bag/Given 10/12/22 0231)  lactated ringers bolus 1,000 mL (0 mLs Intravenous Stopped 10/12/22 0414)    Mobility walks with device. Walker or one person assist per OT assessment      Focused Assessments    R Recommendations: See Admitting Provider Note  Report given to:   Additional Notes:

## 2022-10-12 NOTE — Progress Notes (Signed)
Patient is in X-Ray. RN will message when patient returns.

## 2022-10-12 NOTE — Progress Notes (Signed)
Bilateral carotid ultrasound study completed.  Please see CV Procedures for preliminary results.  Candie Chroman, RVT  9:34 AM 10/12/22

## 2022-10-12 NOTE — Progress Notes (Signed)
Brief progress note: -Admitted earlier today. -Suspect patient had a fall at home. -Due to dementing illness, patient cannot give any cohesive history. -PT/OT input is appreciated.  As per H&P done on admission:  Kirsten Kelly is a 89 y.o. female with medical history significant of dementia, type 2 diabetes, hypertension, hyperlipidemia, mood disorder, osteoporosis presented to the ED via EMS after being found on the ground from a possible fall and being called for a welfare check.  She was last seen by neighbors yesterday.  Patient told ED physician that she had a mechanical fall from tripping over her dog this morning, however, there was concern that this might have been a syncopal event as initially patient had no recollection of getting on the floor when EMS found her.  In the ED, vital signs stable.  Labs significant for WBC 12.5, hemoglobin 12.6, glucose 139, BUN 31, creatinine 1.4 (previously around 0.6 in 2021), CK 450, UA not suggestive of infection.  Chest x-ray showing chronic central vascular congestion and no acute finding.  Pelvic x-ray negative for fracture.  CT head negative for acute finding.  EKG showing sinus rhythm, borderline T wave abnormalities inferolaterally, PVCs.  No acute ischemic changes when compared to prior EKG from March 2018. Patient received 1 L LR bolus.  TRH called to admit.    Patient states she has no family and lives alone with her little dog but does have friends and neighbors who check on her.  States this morning she woke up and was walking to her kitchen when she tripped over something and fell.  Denies head injury or loss of consciousness.  She is endorsing lower back pain.  Denies preceding lightheadedness/dizziness, chest pain, palpitations, or shortness of breath.  States a friend came to check on her and called neighbors and EMS.  She thinks she was on the floor for about 5 or 10 minutes.  Patient states she coughs sometimes in the morning due to mucus in  her throat but not otherwise.  She also reports a few episodes of vomiting over the past few weeks but has not vomited for the past 1 week and has been able to eat.  Reports constipation but did have a bowel movement yesterday.  Denies abdominal pain.  No other complaints.  10/12/2022: Workup for syncope is negative so far.  Pursue disposition.  PT and OT have recommended home with PT OT.  Hydrate patient.

## 2022-10-12 NOTE — H&P (Signed)
History and Physical    Kirsten Kelly [id number] DOB: 1927-11-30 DOA: 10/11/2022  PCP: Harlan Stains, MD  Patient coming from: Home  Chief Complaint: Fall  HPI: Kirsten Kelly is a 89 y.o. female with medical history significant of dementia, type 2 diabetes, hypertension, hyperlipidemia, mood disorder, osteoporosis presented to the ED via EMS after being found on the ground from a possible fall and being called for a welfare check.  She was last seen by neighbors yesterday.  Patient told ED physician that she had a mechanical fall from tripping over her dog this morning, however, there was concern that this might have been a syncopal event as initially patient had no recollection of getting on the floor when EMS found her.  In the ED, vital signs stable.  Labs significant for WBC 12.5, hemoglobin 12.6, glucose 139, BUN 31, creatinine 1.4 (previously around 0.6 in 2021), CK 450, UA not suggestive of infection.  Chest x-ray showing chronic central vascular congestion and no acute finding.  Pelvic x-ray negative for fracture.  CT head negative for acute finding.  EKG showing sinus rhythm, borderline T wave abnormalities inferolaterally, PVCs.  No acute ischemic changes when compared to prior EKG from March 2018. Patient received 1 L LR bolus.  TRH called to admit.   Patient states she has no family and lives alone with her little dog but does have friends and neighbors who check on her.  States this morning she woke up and was walking to her kitchen when she tripped over something and fell.  Denies head injury or loss of consciousness.  She is endorsing lower back pain.  Denies preceding lightheadedness/dizziness, chest pain, palpitations, or shortness of breath.  States a friend came to check on her and called neighbors and EMS.  She thinks she was on the floor for about 5 or 10 minutes.  Patient states she coughs sometimes in the morning due to mucus in her throat but not otherwise.  She also  reports a few episodes of vomiting over the past few weeks but has not vomited for the past 1 week and has been able to eat.  Reports constipation but did have a bowel movement yesterday.  Denies abdominal pain.  No other complaints.  Review of Systems:  Review of Systems  All other systems reviewed and are negative.   Past Medical History:  Diagnosis Date   Arthritis    Diabetes mellitus    Hypertension     Past Surgical History:  Procedure Laterality Date   ABDOMINAL HYSTERECTOMY     ORIF HUMERUS FRACTURE Right 09/28/2016   Procedure: OPEN REDUCTION INTERNAL FIXATION (ORIF) PROXIMAL HUMERUS FRACTURE;  Surgeon: Marybelle Killings, MD;  Location: [facility];  Service: Orthopedics;  Laterality: Right;     reports that she has never smoked. She has never used smokeless tobacco. She reports current alcohol use. She reports that she does not use drugs.  No Known Allergies  History reviewed. No pertinent family history.  Prior to Admission medications   Medication Sig Start Date End Date Taking? Authorizing Provider  alendronate (FOSAMAX) 70 MG tablet Take 70 mg by mouth once a week. 05/05/19   [provider]  amLODipine (NORVASC) 10 MG tablet Take 10 mg by mouth daily. 07/05/20   [provider]  amLODipine (NORVASC) 10 MG tablet Take by mouth. 07/05/20 07/05/21  [provider]  amLODipine (NORVASC) 5 MG tablet  06/18/18   [provider]  aspirin 81 MG tablet  Take 81 mg by mouth daily.    [provider]  atorvastatin (LIPITOR) 10 MG tablet 1 TABLET ONCE A DAY ORALLY 90 DAYS 10/25/16   [provider]  carvedilol (COREG) 25 MG tablet 1 TABLET TWICE A DAY ORALLY 90 DAYS 10/25/16   [provider]  carvedilol (COREG) 6.25 MG tablet Take 6.25 mg by mouth 2 (two) times daily with a meal.    [provider]  cetirizine (ZYRTEC) 10 MG tablet Take 10 mg by mouth daily.    [provider]  cetirizine (ZYRTEC) 10 MG tablet Take  1 tablet by mouth daily. 07/06/20 07/06/21  [provider]  chlorthalidone (HYGROTON) 25 MG tablet Take 25 mg by mouth daily. 07/05/20   [provider]  chlorthalidone (HYGROTON) 25 MG tablet Take by mouth. 07/05/20   [provider]  [facility] SENNA 8.6 MG tablet Take by mouth. 07/05/20   [provider]  donepezil (ARICEPT) 5 MG tablet Take 5 mg by mouth daily. 07/18/20   [provider]  enalapril (VASOTEC) 20 MG tablet Take 20 mg by mouth daily.      [provider]  fluticasone (FLONASE) 50 MCG/ACT nasal spray Place into both nostrils. 07/05/20   [provider]  fluticasone (FLONASE) 50 MCG/ACT nasal spray Use 1 spray into each nostril daily. 07/06/20 07/06/21  [provider]  GAVILAX 17 GM/SCOOP powder Take by mouth. 07/05/20   [provider]  JANUMET XR 50-1000 MG TB24 1 TABLET WITH EVENING MEAL ONCE A DAY ORALLY 90 DAYS 10/12/16   [provider]  KLOR-CON M20 20 MEQ tablet TAKE 1 TABLET BY MOUTH EVERY DAY 90 10/25/16   [provider]  losartan (COZAAR) 100 MG tablet Take 100 mg by mouth daily. 07/05/20   [provider]  losartan (COZAAR) 100 MG tablet Take by mouth. 07/05/20   [provider]  magnesium oxide (MAG-OX) 400 MG tablet Take 1 tablet by mouth daily. 07/05/20   [provider]  metFORMIN (GLUCOPHAGE) 500 MG tablet Take 500 mg by mouth daily. 07/05/20   [provider]  metFORMIN (GLUCOPHAGE) 500 MG tablet Take 1 tablet by mouth daily. 07/05/20   [provider]  metFORMIN (GLUCOPHAGE-XR) 500 MG 24 hr tablet  06/18/18   [provider]  olmesartan-hydrochlorothiazide (BENICAR HCT) 40-25 MG per tablet Take 1 tablet by mouth daily.     [provider]  sertraline (ZOLOFT) 100 MG tablet Take 100 mg by mouth daily. 04/09/18   [provider]  sertraline (ZOLOFT) 50 MG tablet Take 150 mg by mouth daily. 07/05/20   [provider]  sertraline (ZOLOFT) 50 MG tablet Take by mouth. 07/06/20   [provider]  spironolactone (ALDACTONE) 50 MG tablet Take 50 mg by mouth daily. 07/05/20   [provider]  tiZANidine (ZANAFLEX) 2 MG tablet  06/18/18   [provider]  traMADol (ULTRAM) 50 MG tablet Take 1 tablet (50 mg total) by mouth every 6 (six) hours as needed. 10/01/16   Lanae Crumbly, PA-C    Physical Exam: Vitals:   10/11/22 2145 10/11/22 2300 10/11/22 2330 10/12/22 0000  BP: 130/89  (!) 127/59 117/88  Pulse: 63 64 67 65  Resp: 14 (!) 21 14 15   Temp:      TempSrc:      SpO2: 95% 94% 94% 94%    Physical Exam Vitals reviewed.  Constitutional:      General: She is not in  acute distress. HENT:     Head: Normocephalic and atraumatic.  Eyes:     Extraocular Movements: Extraocular movements intact.  Cardiovascular:     Rate and Rhythm: Normal rate and regular rhythm.     Pulses: Normal pulses.  Pulmonary:     Effort: Pulmonary effort is normal. No respiratory distress.     Breath sounds: Normal breath sounds. No wheezing or rales.  Abdominal:     General: Bowel sounds are normal. There is no distension.     Palpations: Abdomen is soft.     Tenderness: There is no abdominal tenderness.  Musculoskeletal:     Cervical back: Normal range of motion.     Right lower leg: No edema.     Left lower leg: No edema.  Skin:    General: Skin is warm and dry.  Neurological:     General: No focal deficit present.     Mental Status: She is alert.     Comments: Oriented to person and place, knows the month is March but does not remember the year     Labs on Admission: I have personally reviewed following labs and imaging studies  CBC: Recent Labs  Lab 10/11/22 1800  WBC 12.5*  NEUTROABS 10.2*  HGB 12.6  HCT 39.1  MCV 90.7  PLT [id number]   Basic Metabolic Panel: Recent Labs  Lab 10/11/22 1800  NA 137  K 4.6  CL 101  CO2 24  GLUCOSE 139*  BUN 31*  CREATININE 1.42*   CALCIUM 9.7   GFR: CrCl cannot be calculated (Unknown ideal weight.). Liver Function Tests: No results for input(s): "AST", "ALT", "ALKPHOS", "BILITOT", "PROT", "ALBUMIN" in the last 168 hours. No results for input(s): "LIPASE", "AMYLASE" in the last 168 hours. No results for input(s): "AMMONIA" in the last 168 hours. Coagulation Profile: No results for input(s): "INR", "PROTIME" in the last 168 hours. Cardiac Enzymes: Recent Labs  Lab 10/11/22 1800  CKTOTAL 450*   BNP (last 3 results) No results for input(s): "PROBNP" in the last 8760 hours. HbA1C: No results for input(s): "HGBA1C" in the last 72 hours. CBG: No results for input(s): "GLUCAP" in the last 168 hours. Lipid Profile: No results for input(s): "CHOL", "HDL", "LDLCALC", "TRIG", "CHOLHDL", "LDLDIRECT" in the last 72 hours. Thyroid Function Tests: No results for input(s): "TSH", "T4TOTAL", "FREET4", "T3FREE", "THYROIDAB" in the last 72 hours. Anemia Panel: No results for input(s): "VITAMINB12", "FOLATE", "FERRITIN", "TIBC", "IRON", "RETICCTPCT" in the last 72 hours. Urine analysis:    Component Value Date/Time   COLORURINE COLORLESS (A) 10/11/2022 2201   APPEARANCEUR CLEAR 10/11/2022 2201   LABSPEC 1.000 (L) 10/11/2022 2201   PHURINE 6.0 10/11/2022 2201   GLUCOSEU NEGATIVE 10/11/2022 2201   HGBUR NEGATIVE 10/11/2022 2201   BILIRUBINUR NEGATIVE 10/11/2022 2201   KETONESUR NEGATIVE 10/11/2022 2201   PROTEINUR NEGATIVE 10/11/2022 2201   NITRITE NEGATIVE 10/11/2022 2201   LEUKOCYTESUR NEGATIVE 10/11/2022 2201    Radiological Exams on Admission: CT Head Wo Contrast  Result Date: 10/11/2022 CLINICAL DATA:  Found down EXAM: CT HEAD WITHOUT CONTRAST TECHNIQUE: Contiguous axial images were obtained from the base of the skull through the vertex without intravenous contrast. RADIATION DOSE REDUCTION: This exam was performed according to the departmental dose-optimization program which includes automated exposure control,  adjustment of the mA and/or kV according to patient size and/or use of iterative reconstruction technique. COMPARISON:  09/21/2016 FINDINGS: Brain: There is no mass, hemorrhage or extra-axial collection. The size and configuration of the ventricles and  extra-axial CSF spaces are normal. There is hypoattenuation of the white matter, most commonly indicating chronic small vessel disease. Vascular: No abnormal hyperdensity of the major intracranial arteries or dural venous sinuses. No intracranial atherosclerosis. Skull: The visualized skull base, calvarium and extracranial soft tissues are normal. Sinuses/Orbits: No fluid levels or advanced mucosal thickening of the visualized paranasal sinuses. No mastoid or middle ear effusion. The orbits are normal. IMPRESSION: Chronic small vessel disease without acute intracranial abnormality. Electronically Signed   By: Ulyses Jarred M.D.   On: 10/11/2022 20:31   DG Chest Portable 1 View  Result Date: 10/11/2022 CLINICAL DATA:  [place], lower back pain EXAM: PORTABLE CHEST 1 VIEW COMPARISON:  09/21/2016 FINDINGS: Single frontal view of the chest demonstrates an unremarkable cardiac silhouette. There is stable prominence of the central pulmonary vasculature, without airspace disease, effusion, or pneumothorax. Atherosclerosis of the aortic arch. ORIF right humerus. No acute displaced fractures. IMPRESSION: 1. No acute intrathoracic process. 2. Chronic central vascular congestion. Electronically Signed   By: Randa Ngo M.D.   On: 10/11/2022 18:49   DG Pelvis Portable  Result Date: 10/11/2022 CLINICAL DATA:  [place], lower back pain EXAM: PORTABLE PELVIS 1-2 VIEWS COMPARISON:  08/07/2011 FINDINGS: Single frontal view of the pelvis includes both hips. There are no acute displaced fractures. Symmetrical bilateral hip osteoarthritis. Sacroiliac joints are unremarkable. Prominent degenerative changes within the lower lumbar spine. Soft tissues are unremarkable. IMPRESSION: 1.  Degenerative changes as above.  No acute displaced fracture. Electronically Signed   By: Randa Ngo M.D.   On: 10/11/2022 18:48    Assessment and Plan  Mechanical fall versus possible syncope Patient states she woke up this morning and was walking to her kitchen when she tripped over something and fell.  She denies head injury or loss of consciousness.  However, there is concern that this might have been a syncopal event as initially patient had no recollection of getting on the floor when EMS found her.  She does have dementia but currently oriented to person and place and answering questions appropriately.  Chest x-ray showing chronic central vascular congestion and no acute finding.  CT head negative for acute finding and no focal neurodeficit on exam.  EKG showing sinus rhythm, borderline T wave abnormalities inferolaterally, PVCs.  No acute ischemic changes when compared to prior EKG from March 2018.  Patient denies chest pain.  PE less likely given no tachycardia or hypoxemia.  Will admit for observation, cardiac monitoring, echocardiogram, EEG, carotid Dopplers, and check orthostatics.  PT/OT eval, fall precautions.  TOC consulted for placement as she lives alone.  Mild rhabdomyolysis In the setting of fall at home and being found on the floor.  CK 450.  Has AKI as mentioned below.  Check LFTs.  Continue IV fluid hydration and trend CK.  Chest x-ray showing central vascular congestion which appears to be chronic.  No history of CHF and not hypoxemic.  Check BNP.  AKI In the setting of mild rhabdomyolysis.  BUN 31, creatinine 1.4 (previously on 0.6 in 2021).  Dehydration also likely contributing as patient reports a few episodes of vomiting a week ago, now resolved.  Continue IV fluid hydration and monitor renal function.  Avoid nephrotoxic agents/hold home diuretics and ACE inhibitor/ARB.  Low back pain In the setting of recent fall.  X-ray ordered to rule out fracture.  Mild  leukocytosis Likely reactive.  No infectious signs or symptoms.  Repeat CBC in a.m.  Type 2 diabetes Not on insulin.  Check A1c.  Sensitive sliding scale insulin ACHS.  Hold oral hypoglycemic agents at this time.  Hypertension Currently normotensive.  Hold antihypertensives at this time and check orthostatics.  Hyperlipidemia Hold statin given rhabdomyolysis.  Constipation and vomiting KUB ordered as patient reports constipation and episodes of vomiting a week ago.  Abdominal exam benign.  Dementia: Follow delirium precautions. Mood disorder Pharmacy med rec pending.  DVT prophylaxis: SCDs.  No chemical DVT prophylaxis until lumbar x-ray is done. Code Status: Full Code (discussed with the patient) Family Communication: No family available. Level of care: Telemetry bed Admission status: It is my clinical opinion that referral for OBSERVATION is reasonable and necessary in this patient based on the above information provided. The aforementioned taken together are felt to place the patient at high risk for further clinical deterioration. However, it is anticipated that the patient may be medically stable for discharge from the hospital within 24 to 48 hours.   Shela Leff MD [facility]  If 7PM-7AM, please contact night-coverage [email or web address]  10/12/2022, 1:23 AM

## 2022-10-12 NOTE — Plan of Care (Signed)
  Problem: Coping: Goal: Ability to adjust to condition or change in health will improve Outcome: Progressing   

## 2022-10-12 NOTE — Progress Notes (Signed)
  Echocardiogram 2D Echocardiogram has been performed.  Lana Fish 10/12/2022, 10:43 AM

## 2022-10-12 NOTE — ED Notes (Signed)
Pt transported to [facility] 

## 2022-10-12 NOTE — Procedures (Signed)
Patient Name: Kirsten Kelly  MRN: [id number]  Epilepsy Attending: Lora Havens  Referring Physician/Provider: Shela Leff, MD  Date: 10/12/2022 Duration: 24.07 mins  Patient history: 89yo F with syncope. EEG to evaluate for seizure  Level of alertness: Awake  AEDs during EEG study: None  Technical aspects: This EEG study was done with scalp electrodes positioned according to the 10-20 International system of electrode placement. Electrical activity was reviewed with band pass filter of 1-70Hz , sensitivity of 7 uV/mm, display speed of 25mm/sec with a 60Hz  notched filter applied as appropriate. EEG data were recorded continuously and digitally stored.  Video monitoring was available and reviewed as appropriate.  Description: The posterior dominant rhythm consists of 8 Hz activity of moderate voltage (25-35 uV) seen predominantly in posterior head regions, symmetric and reactive to eye opening and eye closing. Physiologic photic driving was seen during photic stimulation.  Hyperventilation was not performed.     IMPRESSION: This study is within normal limits. No seizures or epileptiform discharges were seen throughout the recording.  A normal interictal EEG does not exclude the diagnosis of epilepsy.  Piotr Christopher Barbra Sarks

## 2022-10-12 NOTE — ED Notes (Signed)
Pt and I walked to the bathroom. Unsteady gait at first, but better on the way back. Patient had no complaints.

## 2022-10-12 NOTE — Evaluation (Signed)
Physical Therapy Evaluation Patient Details Name: Kirsten Kelly MRN: [id number] DOB: Sep 09, 1927 Today's Date: 10/12/2022  History of Present Illness  Pt is a 89 yo female admitted after being found on ground in house with front door open. Pt is unclear how she fell or how long she was down. Xrays and EEG normal.  Pt c/o back pain. Pt with AKI and mild rhabdo. PMH: DM, arthritis, dementia, HTN  Clinical Impression  Pt presents to PT with deficits in gait, balance, power, endurance, cognition. Pt has a history of falls, reporting that most occur without use of DME. When ambulating with unilateral UE support pt appears very unsteady, with BUE support of walker the pt's balance is much improved. PT recommends use of walker for all out of bed mobility. PT also recommends HHPT at the time of discharge.       Recommendations for follow up therapy are one component of a multi-disciplinary discharge planning process, led by the attending physician.  Recommendations may be updated based on patient status, additional functional criteria and insurance authorization.  Follow Up Recommendations Home health PT      Assistance Recommended at Discharge Intermittent Supervision/Assistance  Patient can return home with the following  A little help with bathing/dressing/bathroom;Assistance with cooking/housework;Direct supervision/assist for medications management;Direct supervision/assist for financial management;Assist for transportation;Help with stairs or ramp for entrance    Equipment Recommendations Rolling walker (2 wheels)  Recommendations for Other Services       Functional Status Assessment Patient has had a recent decline in their functional status and demonstrates the ability to make significant improvements in function in a reasonable and predictable amount of time.     Precautions / Restrictions Precautions Precautions: Fall Precaution Comments: pt reports a few falls a year, contradicting  earlier report to OT Restrictions Weight Bearing Restrictions: No      Mobility  Bed Mobility Overal bed mobility: Needs Assistance Bed Mobility: Sit to Supine       Sit to supine: Supervision        Transfers Overall transfer level: Needs assistance Equipment used: None Transfers: Sit to/from Stand Sit to Stand: Supervision                Ambulation/Gait Ambulation/Gait assistance: Min guard Gait Distance (Feet): 100 Feet Assistive device: Rolling walker (2 wheels) Gait Pattern/deviations: Step-through pattern Gait velocity: reduced Gait velocity interpretation: <1.8 ft/sec, indicate of risk for recurrent falls   General Gait Details: steady step-through gait with support of RW  Stairs            Wheelchair Mobility    Modified Rankin (Stroke Patients Only)       Balance Overall balance assessment: Needs assistance Sitting-balance support: No upper extremity supported, Feet supported Sitting balance-Leahy Scale: Good     Standing balance support: Bilateral upper extremity supported, Reliant on assistive device for balance Standing balance-Leahy Scale: Poor                               Pertinent Vitals/Pain Pain Assessment Pain Assessment: No/denies pain    Home Living Family/patient expects to be discharged to:: Private residence Living Arrangements: Alone Available Help at Discharge: Friend(s);Available PRN/intermittently Type of Home: House Home Access: Stairs to enter Entrance Stairs-Rails: Right;Left;Can reach both Entrance Stairs-Number of Steps: 3   Home Layout: One level Home Equipment: Cane - single point;BSC/3in1;Shower seat Additional Comments: Pt has cane but does not use it.  Prior Function Prior Level of Function : History of Falls (last six months);Patient poor historian/Family not available;Needs assist             Mobility Comments: pt ambulates without DME, falls often ADLs Comments: Friend  comes to take pt shopping as she does not drive and also pays her bills for her.     Hand Dominance   Dominant Hand: Right    Extremity/Trunk Assessment   Upper Extremity Assessment Upper Extremity Assessment: Overall WFL for tasks assessed    Lower Extremity Assessment Lower Extremity Assessment: Generalized weakness    Cervical / Trunk Assessment Cervical / Trunk Assessment: Kyphotic  Communication   Communication: HOH  Cognition Arousal/Alertness: Awake/alert Behavior During Therapy: WFL for tasks assessed/performed Overall Cognitive Status: Impaired/Different from baseline Area of Impairment: Orientation, Memory, Safety/judgement, Awareness                 Orientation Level: Disoriented to, Time   Memory: Decreased recall of precautions, Decreased short-term memory   Safety/Judgement: Decreased awareness of safety, Decreased awareness of deficits Awareness: Emergent   General Comments: history of dementia noted in chart        General Comments General comments (skin integrity, edema, etc.): VSS on RA    Exercises     Assessment/Plan    PT Assessment Patient needs continued PT services  PT Problem List Decreased activity tolerance;Decreased balance;Decreased mobility;Decreased knowledge of use of DME;Decreased safety awareness;Decreased knowledge of precautions       PT Treatment Interventions DME instruction;Gait training;Stair training;Functional mobility training;Therapeutic activities;Therapeutic exercise;Balance training;Neuromuscular re-education;Patient/family education    PT Goals (Current goals can be found in the Care Plan section)  Acute Rehab PT Goals Patient Stated Goal: to go home PT Goal Formulation: With patient Time For Goal Achievement: 10/26/22 Potential to Achieve Goals: Good Additional Goals Additional Goal #1: Pt will recall recommendations for walker use 100% of the time, to demonstrate improved safety awareness     Frequency Min 3X/week     Co-evaluation               AM-PAC PT "6 Clicks" Mobility  Outcome Measure Help needed turning from your back to your side while in a flat bed without using bedrails?: A Little Help needed moving from lying on your back to sitting on the side of a flat bed without using bedrails?: A Little Help needed moving to and from a bed to a chair (including a wheelchair)?: A Little Help needed standing up from a chair using your arms (e.g., wheelchair or bedside chair)?: A Little Help needed to walk in hospital room?: A Little Help needed climbing 3-5 steps with a railing? : A Lot 6 Click Score: 17    End of Session   Activity Tolerance: Patient tolerated treatment well Patient left: in bed;with call bell/phone within reach Nurse Communication: Mobility status PT Visit Diagnosis: Other abnormalities of gait and mobility (R26.89);Muscle weakness (generalized) (M62.81);History of falling (Z91.81)    Time: [id number] PT Time Calculation (min) (ACUTE ONLY): 14 min   Charges:   PT Evaluation $PT Eval Low Complexity: 1 Low          Zenaida Niece, PT, DPT Acute Rehabilitation Office [phone]   Zenaida Niece 10/12/2022, 10:25 AM

## 2022-10-12 NOTE — TOC Initial Note (Addendum)
Transition of Care [facility]) - Initial/Assessment Note    Patient Details  Name: Kirsten Kelly MRN: [id number] Date of Birth: [birth year]  Transition of Care [facility]) CM/SW Contact:    Zenon Mayo, RN Phone Number: 10/12/2022, 4:30 PM  Clinical Narrative:                 From home alone, she has caregiver, Rosetta Donaldson [phone] . Per pt eval rec HHPT. NCM offered choice to patient for [place], [place], [place]. She chose [place], NCM made referral to [facility] he is able to take referral.  Soc will begin 24 to 48 hrs post dc. Rosetta will transport her home at dc. NCM offered choice for rolling walker, she states she has no preference.  NCM made referral to [facility] with [employer], he will have a walker brought to patient's room.   Expected Discharge Plan: [place] Barriers to Discharge: Continued Medical Work up   Patient Goals and CMS Choice Patient states their goals for this hospitalization and ongoing recovery are:: return home CMS Medicare.gov Compare Post Acute Care list provided to:: Patient Choice offered to / list presented to : Patient      Expected Discharge Plan and Services In-house Referral: NA Discharge Planning Services: CM Consult Post Acute Care Choice: [place] arrangements for the past 2 months: [place]                 DME Arranged: Walker rolling DME Agency: [employer] Date DME Agency Contacted: 10/12/22 Time DME Agency Contacted: [phone] Representative spoke with at DME Agency: Brenton Grills HH Arranged: RN, PT, OT [place] Agency: [place] Date [place]: 10/12/22 Time [place]: 1629 Representative spoke with at [place]: [place] Arrangements/Services Living arrangements for the past 2 months: [place] with:: Self Patient language and need for interpreter reviewed:: Yes Do you feel safe going back to the place where you live?: Yes      Need for Family  Participation in Patient Care: Yes (Comment) Care giver support system in place?: Yes (comment)   Criminal Activity/Legal Involvement Pertinent to Current Situation/Hospitalization: No - Comment as needed  Activities of Daily Living      Permission Sought/Granted                  Emotional Assessment Appearance:: Appears stated age Attitude/Demeanor/Rapport: Engaged Affect (typically observed): Appropriate Orientation: : Oriented to Self, Oriented to Place, Oriented to  Time, Oriented to Situation Alcohol / Substance Use: Not Applicable Psych Involvement: No (comment)  Admission diagnosis:  Syncope [R55] AKI (acute kidney injury) ([place]) [N17.9] Fall, initial encounter [W19.XXXA] Non-traumatic rhabdomyolysis [M62.82] Patient Active Problem List   Diagnosis Date Noted   Syncope 10/12/2022   Fall at home, initial encounter 10/12/2022   Rhabdomyolysis 10/12/2022   AKI (acute kidney injury) ([place]) 10/12/2022   Low back pain 10/12/2022   Hyperlipidemia 10/12/2022   Constipation 10/12/2022   Depression 10/02/2016   HTN (hypertension) 10/02/2016   History of depression    Controlled type 2 diabetes mellitus without complication, without long-term current use of insulin ([place])    Closed fracture of right proximal humerus    Proximal humerus fracture 09/28/2016   Type 2 diabetes mellitus ([place]) 01/25/2011   PCP:  Harlan Stains, MD Pharmacy:   [id number] - [place], [place] [id number] [place]  [place] [address] Phone: [phone] Fax: [phone]     Social Determinants of Health (SDOH) Social History: SDOH Screenings   Tobacco Use: Low Risk  (10/12/2022)   SDOH Interventions:     Readmission Risk Interventions     No data to display

## 2022-10-12 NOTE — Progress Notes (Signed)
EEG complete - results pending 

## 2022-10-12 NOTE — Evaluation (Signed)
Occupational Therapy Evaluation Patient Details Name: Kirsten Kelly MRN: [id number] DOB: 03/07/1928 Today's Date: 10/12/2022   History of Present Illness Pt is a 89 yo female admitted after being found on ground in house with front door open. Pt is unclear how she fell or how long she was down. Xrays and EEG normal.  Pt c/o back pain. Pt with AKI and mild rhabdo. PMH: DM, arthritis, dementia, HTN   Clinical Impression   Pt admitted with the above diagnosis and has the deficits listed below. Pt would benefit from cont OT to increase balance and safety with all basic adls in standing. Pt lives alone at home with neighbors that check on her daily and someone that comes every other day to take her shopping (pt does not drive) and pays her bills.  Concerns are in area of pt taking recent falls (about once a month) and pts memory and safety awareness are impaired. Pt walked with walker with much safer outcomes than without. Do feel pt will fall at home if not using a walker.  Ideally would be nice if someone could be with her the first day or two of being home after d/c. Feel this pt is on the brink of needing more supervision in the home but does not seem to have it available. Rec HHOT and [place] aid to help transition pt back home without falls and ensure she is using walker at home. If this assist not available, rec SNF.       Recommendations for follow up therapy are one component of a multi-disciplinary discharge planning process, led by the attending physician.  Recommendations may be updated based on patient status, additional functional criteria and insurance authorization.   Follow Up Recommendations  Home health OT     Assistance Recommended at Discharge Intermittent Supervision/Assistance  Patient can return home with the following A little help with bathing/dressing/bathroom;Assistance with cooking/housework;Direct supervision/assist for medications management;Direct supervision/assist for  financial management;Assist for transportation;Help with stairs or ramp for entrance    Functional Status Assessment  Patient has had a recent decline in their functional status and demonstrates the ability to make significant improvements in function in a reasonable and predictable amount of time.  Equipment Recommendations  None recommended by OT    Recommendations for Other Services       Precautions / Restrictions Precautions Precautions: Fall Precaution Comments: Pt states she falls about once a month but can usually get back up. Restrictions Weight Bearing Restrictions: No      Mobility Bed Mobility Overal bed mobility: Needs Assistance Bed Mobility: Supine to Sit     Supine to sit: Min assist     General bed mobility comments: Feel pt was limited more due to size of ER gurney.  If in regular bed, feel pt would not have needed assist.    Transfers Overall transfer level: Needs assistance Equipment used: 1 person hand held assist Transfers: Sit to/from Stand Sit to Stand: Min guard           General transfer comment: steading assist. Pt became steadier the further she walked.  Pt much safer with walker.      Balance Overall balance assessment: Needs assistance Sitting-balance support: Feet supported Sitting balance-Leahy Scale: Good     Standing balance support: Bilateral upper extremity supported, During functional activity, Reliant on assistive device for balance Standing balance-Leahy Scale: Poor Standing balance comment: Feel pt needs walker for safe standing.  ADL either performed or assessed with clinical judgement   ADL Overall ADL's : Needs assistance/impaired Eating/Feeding: Set up;Sitting   Grooming: Wash/dry hands;Wash/dry face;Oral care;Min guard;Standing Grooming Details (indicate cue type and reason): Pt groomed at sink with min guard.  Slight balance defcits noted. Upper Body Bathing: Set  up;Sitting   Lower Body Bathing: Min guard;Sit to/from stand;Cueing for compensatory techniques   Upper Body Dressing : Set up;Sitting   Lower Body Dressing: Minimal assistance;Sit to/from stand;Cueing for compensatory techniques Lower Body Dressing Details (indicate cue type and reason): Pt does much better dressing sitting in chair than on EOB. Pt states she sits in chair at home to dress. Toilet Transfer: Minimal assistance;Ambulation;Grab bars;Comfort height toilet Toilet Transfer Details (indicate cue type and reason): Pt walked to bathroom in ER with hand held min assist. Pt would fall without walker. PT saw pt after and pt demonstrated better safety and balance with walker. Toileting- Clothing Manipulation and Hygiene: Minimal assistance;Sit to/from stand;Cueing for compensatory techniques Toileting - Clothing Manipulation Details (indicate cue type and reason): min assist to get to full standing position off toilet.     Functional mobility during ADLs: Minimal assistance General ADL Comments: Pt limited with adls in standing due to decreased balance. Pt much safer with use of walker when up.     Vision Baseline Vision/History: 0 No visual deficits Ability to See in Adequate Light: 0 Adequate Patient Visual Report: No change from baseline Vision Assessment?: No apparent visual deficits     Perception     Praxis Praxis Praxis tested?: Within functional limits    Pertinent Vitals/Pain Pain Assessment Pain Assessment: Faces Faces Pain Scale: Hurts a little bit Pain Location: back Pain Descriptors / Indicators: Aching Pain Intervention(s): Monitored during session     Hand Dominance Right   Extremity/Trunk Assessment Upper Extremity Assessment Upper Extremity Assessment: Overall WFL for tasks assessed   Lower Extremity Assessment Lower Extremity Assessment: Defer to PT evaluation   Cervical / Trunk Assessment Cervical / Trunk Assessment: Kyphotic   Communication  Communication Communication: No difficulties   Cognition Arousal/Alertness: Awake/alert Behavior During Therapy: WFL for tasks assessed/performed Overall Cognitive Status: Impaired/Different from baseline Area of Impairment: Orientation, Memory, Safety/judgement, Awareness                 Orientation Level: Time   Memory: Decreased recall of precautions, Decreased short-term memory   Safety/Judgement: Decreased awareness of safety, Decreased awareness of deficits Awareness: Emergent   General Comments: Chart states pt has dementia at baseline but unsure of baseline cognitive level. Friend does driving and money management for her.     General Comments  Pt limited most by balance and cognition. Unsure of pts baseline cognition but pt unaware of day of week.  Did say it was 55 but corrected when pointed out. Pt knew it was March but thought it was beginning of March. Pt appears to be mildly HOH. Does not recall exactly how she fell at home.    Exercises     Shoulder Instructions      Home Living Family/patient expects to be discharged to:: Private residence Living Arrangements: Alone Available Help at Discharge: Friend(s);Available PRN/intermittently Type of Home: House Home Access: Stairs to enter [employer] of Steps: 3 Entrance Stairs-Rails: Right;Left;Can reach both Home Layout: One level     Bathroom Shower/Tub: Tub/shower unit;Curtain   Biochemist, clinical: Standard     Home Equipment: Cane - single point;BSC/3in1;Shower seat   Additional Comments: Pt has cane but does not  use it.      Prior Functioning/Environment Prior Level of Function : History of Falls (last six months);Patient poor historian/Family not available;Needs assist             Mobility Comments: Pt has cane but does not use it. ADLs Comments: Friend comes to take pt shopping as she does not drive and also pays her bills for her.        OT Problem List: Impaired balance  (sitting and/or standing);Decreased cognition;Decreased safety awareness      OT Treatment/Interventions: Self-care/ADL training;Balance training;DME and/or AE instruction;Therapeutic activities    OT Goals(Current goals can be found in the care plan section) Acute Rehab OT Goals Patient Stated Goal: to go home OT Goal Formulation: With patient Time For Goal Achievement: 10/26/22 Potential to Achieve Goals: Good ADL Goals Pt Will Perform Grooming: with modified independence;standing Pt Will Perform Lower Body Bathing: with supervision;sit to/from stand Pt Will Perform Lower Body Dressing: with modified independence;sit to/from stand Pt Will Perform Tub/Shower Transfer: Tub transfer;with supervision;rolling walker;ambulating Additional ADL Goal #1: Pt will walk to bathroom with walker and complete all toileting with mod I  OT Frequency: Min 2X/week    Co-evaluation              AM-PAC OT "6 Clicks" Daily Activity     Outcome Measure Help from another person eating meals?: None Help from another person taking care of personal grooming?: None Help from another person toileting, which includes using toliet, bedpan, or urinal?: A Little Help from another person bathing (including washing, rinsing, drying)?: A Little Help from another person to put on and taking off regular upper body clothing?: None Help from another person to put on and taking off regular lower body clothing?: A Little 6 Click Score: 21   End of Session Equipment Utilized During Treatment: Rolling walker (2 wheels) Nurse Communication: Mobility status  Activity Tolerance: Patient tolerated treatment well Patient left: Other (comment) (walking with PT)  OT Visit Diagnosis: Unsteadiness on feet (R26.81)                Time: [id number] OT Time Calculation (min): 19 min Charges:  OT General Charges $OT Visit: 1 Visit OT Evaluation $OT Eval Moderate Complexity: 1 Mod  Glenford Peers 10/12/2022, 10:16 AM

## 2022-10-13 DIAGNOSIS — Z7983 Long term (current) use of bisphosphonates: Secondary | ICD-10-CM | POA: Diagnosis not present

## 2022-10-13 DIAGNOSIS — Z7984 Long term (current) use of oral hypoglycemic drugs: Secondary | ICD-10-CM | POA: Diagnosis not present

## 2022-10-13 DIAGNOSIS — Y92009 Unspecified place in unspecified non-institutional (private) residence as the place of occurrence of the external cause: Secondary | ICD-10-CM | POA: Diagnosis not present

## 2022-10-13 DIAGNOSIS — Z79899 Other long term (current) drug therapy: Secondary | ICD-10-CM | POA: Diagnosis not present

## 2022-10-13 DIAGNOSIS — M1990 Unspecified osteoarthritis, unspecified site: Secondary | ICD-10-CM | POA: Diagnosis present

## 2022-10-13 DIAGNOSIS — M545 Low back pain, unspecified: Secondary | ICD-10-CM | POA: Diagnosis present

## 2022-10-13 DIAGNOSIS — F39 Unspecified mood [affective] disorder: Secondary | ICD-10-CM | POA: Diagnosis present

## 2022-10-13 DIAGNOSIS — R7989 Other specified abnormal findings of blood chemistry: Secondary | ICD-10-CM | POA: Diagnosis present

## 2022-10-13 DIAGNOSIS — K5900 Constipation, unspecified: Secondary | ICD-10-CM | POA: Diagnosis present

## 2022-10-13 DIAGNOSIS — Y9301 Activity, walking, marching and hiking: Secondary | ICD-10-CM | POA: Diagnosis present

## 2022-10-13 DIAGNOSIS — Z7982 Long term (current) use of aspirin: Secondary | ICD-10-CM | POA: Diagnosis not present

## 2022-10-13 DIAGNOSIS — W19XXXA Unspecified fall, initial encounter: Secondary | ICD-10-CM | POA: Diagnosis not present

## 2022-10-13 DIAGNOSIS — F0390 Unspecified dementia without behavioral disturbance: Secondary | ICD-10-CM | POA: Diagnosis present

## 2022-10-13 DIAGNOSIS — D72829 Elevated white blood cell count, unspecified: Secondary | ICD-10-CM | POA: Diagnosis present

## 2022-10-13 DIAGNOSIS — W1809XA Striking against other object with subsequent fall, initial encounter: Secondary | ICD-10-CM | POA: Diagnosis present

## 2022-10-13 DIAGNOSIS — I10 Essential (primary) hypertension: Secondary | ICD-10-CM | POA: Diagnosis present

## 2022-10-13 DIAGNOSIS — E785 Hyperlipidemia, unspecified: Secondary | ICD-10-CM | POA: Diagnosis present

## 2022-10-13 DIAGNOSIS — I493 Ventricular premature depolarization: Secondary | ICD-10-CM | POA: Diagnosis present

## 2022-10-13 DIAGNOSIS — N179 Acute kidney failure, unspecified: Secondary | ICD-10-CM | POA: Diagnosis present

## 2022-10-13 DIAGNOSIS — E860 Dehydration: Secondary | ICD-10-CM | POA: Diagnosis present

## 2022-10-13 DIAGNOSIS — M810 Age-related osteoporosis without current pathological fracture: Secondary | ICD-10-CM | POA: Diagnosis present

## 2022-10-13 DIAGNOSIS — M6282 Rhabdomyolysis: Secondary | ICD-10-CM | POA: Diagnosis present

## 2022-10-13 DIAGNOSIS — Z90710 Acquired absence of both cervix and uterus: Secondary | ICD-10-CM | POA: Diagnosis not present

## 2022-10-13 DIAGNOSIS — E119 Type 2 diabetes mellitus without complications: Secondary | ICD-10-CM | POA: Diagnosis present

## 2022-10-13 DIAGNOSIS — R55 Syncope and collapse: Secondary | ICD-10-CM | POA: Diagnosis not present

## 2022-10-13 LAB — RENAL FUNCTION PANEL
Albumin: 3.4 g/dL — ABNORMAL LOW (ref 3.5–5.0)
Anion gap: 11 (ref 5–15)
BUN: 24 mg/dL — ABNORMAL HIGH (ref 8–23)
CO2: 24 mmol/L (ref 22–32)
Calcium: 9.5 mg/dL (ref 8.9–10.3)
Chloride: 103 mmol/L (ref 98–111)
Creatinine, Ser: 1.44 mg/dL — ABNORMAL HIGH (ref 0.44–1.00)
GFR, Estimated: 34 mL/min — ABNORMAL LOW (ref >60)
Glucose, Bld: 136 mg/dL — ABNORMAL HIGH (ref 70–99)
Phosphorus: 3.4 mg/dL (ref 2.5–4.6)
Potassium: 4.2 mmol/L (ref 3.5–5.1)
Sodium: 138 mmol/L (ref 135–145)

## 2022-10-13 LAB — CBC WITH DIFFERENTIAL/PLATELET
Abs Immature Granulocytes: 0.05 10*3/uL (ref 0.00–0.07)
Basophils Absolute: 0.1 10*3/uL (ref 0.0–0.1)
Basophils Relative: 1 %
Eosinophils Absolute: 0.3 10*3/uL (ref 0.0–0.5)
Eosinophils Relative: 3 %
HCT: 37.7 % (ref 36.0–46.0)
Hemoglobin: 12.1 g/dL (ref 12.0–15.0)
Immature Granulocytes: 1 %
Lymphocytes Relative: 22 %
Lymphs Abs: 2.2 10*3/uL (ref 0.7–4.0)
MCH: 29.2 pg (ref 26.0–34.0)
MCHC: 32.1 g/dL (ref 30.0–36.0)
MCV: 91.1 fL (ref 80.0–100.0)
Monocytes Absolute: 0.7 10*3/uL (ref 0.1–1.0)
Monocytes Relative: 7 %
Neutro Abs: 6.7 10*3/uL (ref 1.7–7.7)
Neutrophils Relative %: 66 %
Platelets: 169 10*3/uL (ref 150–400)
RBC: 4.14 MIL/uL (ref 3.87–5.11)
RDW: 13.6 % (ref 11.5–15.5)
WBC: 10 10*3/uL (ref 4.0–10.5)
nRBC: 0 % (ref 0.0–0.2)

## 2022-10-13 LAB — GLUCOSE, CAPILLARY
Glucose-Capillary: 147 mg/dL — ABNORMAL HIGH (ref 70–99)
Glucose-Capillary: 125 mg/dL — ABNORMAL HIGH (ref 70–99)
Glucose-Capillary: 174 mg/dL — ABNORMAL HIGH (ref 70–99)

## 2022-10-13 LAB — CK: Total CK: 404 U/L — ABNORMAL HIGH (ref 38–234)

## 2022-10-13 MED ORDER — ACETAMINOPHEN 325 MG PO TABS: 650.0000 mg | ORAL_TABLET | Freq: Every day | ORAL | Status: DC | PRN

## 2022-10-13 MED ORDER — SODIUM CHLORIDE 0.9 % IV SOLN: INTRAVENOUS | Status: DC

## 2022-10-13 NOTE — Progress Notes (Signed)
PROGRESS NOTE    Kirsten Kelly  [id number] DOB: 09-24-1927 DOA: 10/11/2022 PCP: Harlan Stains, MD   Brief Narrative:  PAULETTA Kelly is a 89 y.o. female with medical history significant of dementia, type 2 diabetes, hypertension, hyperlipidemia, mood disorder, osteoporosis presented to the ED via EMS after being found on the ground from a possible fall and being called for a welfare check.  She was last seen by neighbors yesterday.  Patient told ED physician that she had a mechanical fall from tripping over her dog this morning, however, there was concern that this might have been a syncopal event as initially patient had no recollection of getting on the floor when EMS found her.    In the ED, vital signs stable.  Labs significant for WBC 12.5, hemoglobin 12.6, glucose 139, BUN 31, creatinine 1.4 (previously around 0.6 in 2021), CK 450, UA not suggestive of infection.  Chest x-ray showing chronic central vascular congestion and no acute finding.  Pelvic x-ray negative for fracture.  CT head negative for acute finding.  EKG showing sinus rhythm, borderline T wave abnormalities inferolaterally, PVCs.  No acute ischemic changes when compared to prior EKG from March 2018. Patient received 1 L LR bolus.  TRH called to admit.   Assessment & Plan:   Mechanical fall versus possible syncope -Chest x-ray showing chronic central vascular congestion and no acute finding.   -CT head negative for acute finding and no focal neurodeficit on exam.   -EKG showing sinus rhythm, borderline T wave abnormalities inferolaterally, PVCs.   -EEG resulted within normal limit.  Carotid ultrasound negative for any significant stenosis. -Echo shows ejection fraction of 60 to 65%.  No regional wall motion abnormalities.   -PT/OT recommended home health PT/OT  Mild rhabdomyolysis -In the setting of fall at home and being found on the floor.  CK 450.   -Repeat CK this a.m. is pending.  Started patient on IV fluids    AKI -In the setting of mild rhabdomyolysis.  BUN 31, creatinine 1.4 (previously on 0.6 in 2021).   -Started on IV fluids -Monitor renal function closely.  Avoid nephrotoxic medication.   Low back pain -X-ray showed no acute fracture.  Multilevel degenerative changes.   Mild leukocytosis -Resolved   Type 2 diabetes -Not on insulin.  A1c 7.0.  Sensitive sliding scale insulin ACHS.  Hold oral hypoglycemic agents at this time.   Hypertension -Currently normotensive.   Hyperlipidemia -Hold statin given rhabdomyolysis.   Constipation and vomiting -KUB shows nonobstructive bowel gas pattern.   Dementia: Follow delirium precautions.  DVT prophylaxis: SCD Code Status: Full code Family Communication:  None present at bedside.  Plan of care discussed with patient in length and she verbalized understanding and agreed with it. Disposition Plan: Likely home tomorrow  Consultants:  None  Procedures:  None  Antimicrobials:  None  Status is: Observation    Subjective: Patient seen and examined.  Sitting comfortably on the chair.  Eating breakfast.  Denies any headache, blurry vision, lightheadedness, dizziness.  Lives alone at home.  Objective: Vitals:   10/12/22 1915 10/13/22 0017 10/13/22 0312 10/13/22 0955  BP: 132/79 135/67 137/74 125/62  Pulse: 72 64 76 66  Resp: 18 18 18 16   Temp: 98.8 F (37.1 C) 98.8 F (37.1 C) 98.8 F (37.1 C) 97.6 F (36.4 C)  TempSrc: Oral Oral Oral Oral  SpO2: 97% 97% 95% 100%  Weight:  76.8 kg      Intake/Output Summary (Last 24 hours)  at 10/13/2022 1232 Last data filed at 10/12/2022 2200 Gross per 24 hour  Intake --  Output 240 ml  Net -240 ml   Filed Weights   10/13/22 0017  Weight: 76.8 kg    Examination:  General exam: Appears calm and comfortable, on room air, communicating well Respiratory system: Clear to auscultation. Respiratory effort normal. Cardiovascular system: S1 & S2 heard, RRR. No JVD, murmurs, rubs, gallops  or clicks. No pedal edema. Gastrointestinal system: Abdomen is nondistended, soft and nontender. No organomegaly or masses felt. Normal bowel sounds heard. Central nervous system: Alert and oriented. No focal neurological deficits. Extremities: Symmetric 5 x 5 power. Skin: No rashes, lesions or ulcers Psychiatry: Judgement and insight appear normal. Mood & affect appropriate.    Data Reviewed: I have personally reviewed following labs and imaging studies  CBC: Recent Labs  Lab 10/11/22 1800 10/12/22 0211 10/13/22 0028  WBC 12.5* 12.7* 10.0  NEUTROABS 10.2*  --  6.7  HGB 12.6 12.4 12.1  HCT 39.1 39.1 37.7  MCV 90.7 92.2 91.1  PLT 174 171 [id number]   Basic Metabolic Panel: Recent Labs  Lab 10/11/22 1800 10/12/22 0211 10/13/22 0028  NA 137 138 138  K 4.6 4.4 4.2  CL 101 101 103  CO2 24 28 24   GLUCOSE 139* 112* 136*  BUN 31* 27* 24*  CREATININE 1.42* 1.47* 1.44*  CALCIUM 9.7 10.0 9.5  PHOS  --   --  3.4   GFR: CrCl cannot be calculated (Unknown ideal weight.). Liver Function Tests: Recent Labs  Lab 10/12/22 0211 10/13/22 0028  AST 42*  --   ALT 19  --   ALKPHOS 63  --   BILITOT 1.2  --   PROT 7.5  --   ALBUMIN 3.7 3.4*   No results for input(s): "LIPASE", "AMYLASE" in the last 168 hours. No results for input(s): "AMMONIA" in the last 168 hours. Coagulation Profile: No results for input(s): "INR", "PROTIME" in the last 168 hours. Cardiac Enzymes: Recent Labs  Lab 10/11/22 1800 10/12/22 0211  CKTOTAL 450* 501*   BNP (last 3 results) No results for input(s): "PROBNP" in the last 8760 hours. HbA1C: Recent Labs    10/12/22 0211  HGBA1C 7.0*   CBG: Recent Labs  Lab 10/12/22 0806 10/12/22 1207 10/12/22 1645 10/12/22 2103 10/13/22 0543  GLUCAP 97 96 109* 133* 147*   Lipid Profile: No results for input(s): "CHOL", "HDL", "LDLCALC", "TRIG", "CHOLHDL", "LDLDIRECT" in the last 72 hours. Thyroid Function Tests: No results for input(s): "TSH", "T4TOTAL",  "FREET4", "T3FREE", "THYROIDAB" in the last 72 hours. Anemia Panel: No results for input(s): "VITAMINB12", "FOLATE", "FERRITIN", "TIBC", "IRON", "RETICCTPCT" in the last 72 hours. Sepsis Labs: No results for input(s): "PROCALCITON", "LATICACIDVEN" in the last 168 hours.  No results found for this or any previous visit (from the past 240 hour(s)).    Radiology Studies: ECHOCARDIOGRAM COMPLETE  Result Date: 10/12/2022    ECHOCARDIOGRAM REPORT   Patient Name:   DALIDA WLODARSKI Date of Exam: 10/12/2022 Medical Rec #:  [id number]       Height:       66.0 in Accession #:    [id number]      Weight:       171.0 lb Date of Birth:  1928-05-04       BSA:          1.871 m Patient Age:    33 years        BP:  113/75 mmHg Patient Gender: F               HR:           60 bpm. Exam Location:  Inpatient Procedure: 2D Echo Indications:    syncope  History:        Patient has no prior history of Echocardiogram examinations.                 Risk Factors:Hypertension, Dyslipidemia and Diabetes.  Sonographer:    Harvie Junior Referring Phys: [id number] Assumption  1. Left ventricular ejection fraction, by estimation, is 60 to 65%. The left ventricle has normal function. The left ventricle has no regional wall motion abnormalities. Left ventricular diastolic parameters are indeterminate.  2. Right ventricular systolic function is normal. The right ventricular size is normal. There is normal pulmonary artery systolic pressure.  3. The mitral valve is normal in structure. No evidence of mitral valve regurgitation. No evidence of mitral stenosis.  4. The aortic valve is tricuspid. Aortic valve regurgitation is not visualized. No aortic stenosis is present.  5. The inferior vena cava is normal in size with greater than 50% respiratory variability, suggesting right atrial pressure of 3 mmHg. Comparison(s): No prior Echocardiogram. Conclusion(s)/Recommendation(s): Normal biventricular function without evidence  of hemodynamically significant valvular heart disease. FINDINGS  Left Ventricle: Left ventricular ejection fraction, by estimation, is 60 to 65%. The left ventricle has normal function. The left ventricle has no regional wall motion abnormalities. The left ventricular internal cavity size was normal in size. There is  no left ventricular hypertrophy. Left ventricular diastolic parameters are indeterminate. Right Ventricle: The right ventricular size is normal. No increase in right ventricular wall thickness. Right ventricular systolic function is normal. There is normal pulmonary artery systolic pressure. The tricuspid regurgitant velocity is 2.84 m/s, and  with an assumed right atrial pressure of 3 mmHg, the estimated right ventricular systolic pressure is [id number] mmHg. Left Atrium: Left atrial size was normal in size. Right Atrium: Right atrial size was normal in size. Pericardium: There is no evidence of pericardial effusion. Mitral Valve: The mitral valve is normal in structure. There is mild calcification of the mitral valve leaflet(s). No evidence of mitral valve regurgitation. No evidence of mitral valve stenosis. Tricuspid Valve: The tricuspid valve is grossly normal. Tricuspid valve regurgitation is trivial. No evidence of tricuspid stenosis. Aortic Valve: The aortic valve is tricuspid. Aortic valve regurgitation is not visualized. No aortic stenosis is present. Aortic valve mean gradient measures 3.0 mmHg. Aortic valve peak gradient measures 6.2 mmHg. Aortic valve area, by VTI measures 2.16 cm. Pulmonic Valve: The pulmonic valve was not well visualized. Pulmonic valve regurgitation is not visualized. No evidence of pulmonic stenosis. Aorta: The aortic root, ascending aorta, aortic arch and descending aorta are all structurally normal, with no evidence of dilitation or obstruction. Venous: The inferior vena cava is normal in size with greater than 50% respiratory variability, suggesting right atrial pressure  of 3 mmHg. IAS/Shunts: The atrial septum is grossly normal.  LEFT VENTRICLE PLAX 2D LVIDd:         4.20 cm     Diastology LVIDs:         2.60 cm     LV e' medial:    6.31 cm/s LV PW:         1.00 cm     LV E/e' medial:  13.8 LV IVS:        1.00 cm  LV e' lateral:   12.20 cm/s LVOT diam:     1.90 cm     LV E/e' lateral: 7.1 LV SV:         64 LV SV Index:   34 LVOT Area:     2.84 cm                             3D Volume EF: LV Volumes (MOD)           3D EF:        68 % LV vol d, MOD A2C: 59.7 ml LV EDV:       122 ml LV vol d, MOD A4C: 90.7 ml LV ESV:       39 ml LV vol s, MOD A2C: 26.6 ml LV SV:        83 ml LV vol s, MOD A4C: 34.5 ml LV SV MOD A2C:     33.1 ml LV SV MOD A4C:     90.7 ml LV SV MOD BP:      49.4 ml RIGHT VENTRICLE RV Basal diam:  3.10 cm RV Mid diam:    2.20 cm RV S prime:     14.85 cm/s TAPSE (M-mode): 2.0 cm LEFT ATRIUM             Index        RIGHT ATRIUM           Index LA diam:        2.90 cm 1.55 cm/m   RA Area:     12.90 cm LA Vol (A2C):   39.3 ml 21.00 ml/m  RA Volume:   28.30 ml  15.13 ml/m LA Vol (A4C):   53.9 ml 28.81 ml/m LA Biplane Vol: 45.8 ml 24.48 ml/m  AORTIC VALVE                    PULMONIC VALVE AV Area (Vmax):    2.31 cm     PV Vmax:       0.66 m/s AV Area (Vmean):   2.12 cm     PV Peak grad:  1.7 mmHg AV Area (VTI):     2.16 cm AV Vmax:           124.00 cm/s AV Vmean:          84.200 cm/s AV VTI:            0.298 m AV Peak Grad:      6.2 mmHg AV Mean Grad:      3.0 mmHg LVOT Vmax:         101.00 cm/s LVOT Vmean:        62.900 cm/s LVOT VTI:          0.227 m LVOT/AV VTI ratio: 0.76  AORTA Ao Root diam: 3.60 cm Ao Asc diam:  3.50 cm MITRAL VALVE               TRICUSPID VALVE MV Area (PHT): 3.27 cm    TR Peak grad:   32.3 mmHg MV Decel Time: 232 msec    TR Vmax:        284.00 cm/s MV E velocity: 87.00 cm/s MV A velocity: 80.20 cm/s  SHUNTS MV E/A ratio:  1.08        Systemic VTI:  0.23 m  Systemic Diam: 1.90 cm Buford Dresser MD  Electronically signed by Buford Dresser MD Signature Date/Time: 10/12/2022/3:02:24 PM    Final    VAS US CAROTID  Result Date: 10/12/2022 Carotid Arterial Duplex Study Patient Name:  Marshell Levan  Date of Exam:   10/12/2022 Medical Rec #: [id number]        Accession #:    [id number] Date of Birth: 04-06-1928        Patient Gender: F Patient Age:   58 years Exam Location:  [facility] Procedure:      VAS US CAROTID Referring Phys: Wandra Feinstein RATHORE --------------------------------------------------------------------------------  Indications:       Syncope and Weakness. Risk Factors:      Hypertension, hyperlipidemia, Diabetes. Comparison Study:  No prior study. Performing Technologist: McKayla Maag RVT, [place]  Examination Guidelines: A complete evaluation includes B-mode imaging, spectral Doppler, color Doppler, and power Doppler as needed of all accessible portions of each vessel. Bilateral testing is considered an integral part of a complete examination. Limited examinations for reoccurring indications may be performed as noted.  Right Carotid Findings: +----------+--------+--------+--------+------------------+------------------+           PSV cm/sEDV cm/sStenosisPlaque DescriptionComments           +----------+--------+--------+--------+------------------+------------------+ CCA Prox  76      13                                intimal thickening +----------+--------+--------+--------+------------------+------------------+ CCA Distal45      9                                 intimal thickening +----------+--------+--------+--------+------------------+------------------+ ICA Prox  42      12      1-39%                     intimal thickening +----------+--------+--------+--------+------------------+------------------+ ICA Mid   76      18                                                   +----------+--------+--------+--------+------------------+------------------+ ICA  Distal58      15                                                   +----------+--------+--------+--------+------------------+------------------+ ECA       121     12                                                   +----------+--------+--------+--------+------------------+------------------+ +----------+--------+-------+----------------+-------------------+           PSV cm/sEDV cmsDescribe        Arm Pressure (mmHG) +----------+--------+-------+----------------+-------------------+ [id number]            Multiphasic, WNL                    +----------+--------+-------+----------------+-------------------+ +---------+--------+--+--------+-+---------+ VertebralPSV cm/s33EDV cm/s7Antegrade +---------+--------+--+--------+-+---------+  Left Carotid Findings: +----------+--------+--------+--------+------------------+------------------+  PSV cm/sEDV cm/sStenosisPlaque DescriptionComments           +----------+--------+--------+--------+------------------+------------------+ CCA Prox  81      10                                                   +----------+--------+--------+--------+------------------+------------------+ CCA Distal63      12                                intimal thickening +----------+--------+--------+--------+------------------+------------------+ ICA Prox  58      8       1-39%                     intimal thickening +----------+--------+--------+--------+------------------+------------------+ ICA Mid   48      12                                                   +----------+--------+--------+--------+------------------+------------------+ ICA Distal54      13                                                   +----------+--------+--------+--------+------------------+------------------+ ECA       80      9                                                     +----------+--------+--------+--------+------------------+------------------+ +----------+--------+--------+----------------+-------------------+           PSV cm/sEDV cm/sDescribe        Arm Pressure (mmHG) +----------+--------+--------+----------------+-------------------+ [id number]              Multiphasic, WNL                    +----------+--------+--------+----------------+-------------------+ +---------+--------+--+--------+--+---------+ VertebralPSV cm/s79EDV cm/s14Antegrade +---------+--------+--+--------+--+---------+   Summary: Right Carotid: Velocities in the right ICA are consistent with a 1-39% stenosis. Left Carotid: Velocities in the left ICA are consistent with a 1-39% stenosis. Vertebrals:  Bilateral vertebral arteries demonstrate antegrade flow. Subclavians: Normal flow hemodynamics were seen in bilateral subclavian              arteries. *See table(s) above for measurements and observations.  Electronically signed by Servando Snare MD on 10/12/2022 at 10:42:24 AM.    Final    EEG adult  Result Date: 10/12/2022 Lora Havens, MD     10/12/2022  8:56 AM Patient Name: QUINTANA PESHLAKAI MRN: [id number] Epilepsy Attending: Lora Havens Referring Physician/Provider: Shela Leff, MD Date: 10/12/2022 Duration: 24.07 mins Patient history: 89yo F with syncope. EEG to evaluate for seizure Level of alertness: Awake AEDs during EEG study: None Technical aspects: This EEG study was done with scalp electrodes positioned according to the 10-20 International system of electrode placement. Electrical activity was reviewed with band pass filter of 1-70Hz , sensitivity of 7 uV/mm, display speed of 27mm/sec with a 60Hz  notched  filter applied as appropriate. EEG data were recorded continuously and digitally stored.  Video monitoring was available and reviewed as appropriate. Description: The posterior dominant rhythm consists of 8 Hz activity of moderate voltage (25-35 uV) seen  predominantly in posterior head regions, symmetric and reactive to eye opening and eye closing. Physiologic photic driving was seen during photic stimulation.  Hyperventilation was not performed.   IMPRESSION: This study is within normal limits. No seizures or epileptiform discharges were seen throughout the recording. A normal interictal EEG does not exclude the diagnosis of epilepsy. Lora Havens   DG Abd 1 View  Result Date: 10/12/2022 CLINICAL DATA:  [id number] Fall [id number] Back pain 91320 EXAM: ABDOMEN - 1 VIEW COMPARISON:  CT abdomen pelvis 08/07/2011 FINDINGS: The bowel gas pattern is normal. No radio-opaque calculi or other significant radiographic abnormality are seen. Degenerative changes of the lumbar spine. Degenerative changes of the hips. No acute osseous abnormality with limited evaluation on this frontal view. IMPRESSION: Nonobstructed bowel gas pattern. Electronically Signed   By: Iven Finn M.D.   On: 10/12/2022 02:50   DG Lumbar Spine 2-3 Views  Result Date: 10/12/2022 CLINICAL DATA:  Possible fall. EXAM: LUMBAR SPINE - 2-3 VIEW COMPARISON:  09/21/2016 FINDINGS: There is no evidence of acute lumbar spine fracture. Stable chronic loss of height in the superior endplate at L2 is noted. Alignment is normal. Multilevel intervertebral disc space narrowing, degenerative endplate changes, and facet arthropathy. IMPRESSION: 1. No acute fracture. 2. Multilevel degenerative changes. Electronically Signed   By: Brett Fairy M.D.   On: 10/12/2022 02:34   CT Head Wo Contrast  Result Date: 10/11/2022 CLINICAL DATA:  Found down EXAM: CT HEAD WITHOUT CONTRAST TECHNIQUE: Contiguous axial images were obtained from the base of the skull through the vertex without intravenous contrast. RADIATION DOSE REDUCTION: This exam was performed according to the departmental dose-optimization program which includes automated exposure control, adjustment of the mA and/or kV according to patient size and/or  use of iterative reconstruction technique. COMPARISON:  09/21/2016 FINDINGS: Brain: There is no mass, hemorrhage or extra-axial collection. The size and configuration of the ventricles and extra-axial CSF spaces are normal. There is hypoattenuation of the white matter, most commonly indicating chronic small vessel disease. Vascular: No abnormal hyperdensity of the major intracranial arteries or dural venous sinuses. No intracranial atherosclerosis. Skull: The visualized skull base, calvarium and extracranial soft tissues are normal. Sinuses/Orbits: No fluid levels or advanced mucosal thickening of the visualized paranasal sinuses. No mastoid or middle ear effusion. The orbits are normal. IMPRESSION: Chronic small vessel disease without acute intracranial abnormality. Electronically Signed   By: Ulyses Jarred M.D.   On: 10/11/2022 20:31   DG Chest Portable 1 View  Result Date: 10/11/2022 CLINICAL DATA:  [place], lower back pain EXAM: PORTABLE CHEST 1 VIEW COMPARISON:  09/21/2016 FINDINGS: Single frontal view of the chest demonstrates an unremarkable cardiac silhouette. There is stable prominence of the central pulmonary vasculature, without airspace disease, effusion, or pneumothorax. Atherosclerosis of the aortic arch. ORIF right humerus. No acute displaced fractures. IMPRESSION: 1. No acute intrathoracic process. 2. Chronic central vascular congestion. Electronically Signed   By: Randa Ngo M.D.   On: 10/11/2022 18:49   DG Pelvis Portable  Result Date: 10/11/2022 CLINICAL DATA:  [place], lower back pain EXAM: PORTABLE PELVIS 1-2 VIEWS COMPARISON:  08/07/2011 FINDINGS: Single frontal view of the pelvis includes both hips. There are no acute displaced fractures. Symmetrical bilateral hip osteoarthritis. Sacroiliac joints are unremarkable. Prominent degenerative changes  within the lower lumbar spine. Soft tissues are unremarkable. IMPRESSION: 1. Degenerative changes as above.  No acute displaced fracture.  Electronically Signed   By: Randa Ngo M.D.   On: 10/11/2022 18:48    Scheduled Meds:  insulin aspart  0-5 Units Subcutaneous QHS   insulin aspart  0-9 Units Subcutaneous TID WC   Continuous Infusions:  sodium chloride 100 mL/hr at 10/13/22 0903     LOS: 0 days   Time spent: 35 minutes   Jaya Lapka Loann Quill, MD [facility]  If 7PM-7AM, please contact night-coverage [email or web address] 10/13/2022, 12:32 PM

## 2022-10-14 DIAGNOSIS — R55 Syncope and collapse: Secondary | ICD-10-CM | POA: Diagnosis not present

## 2022-10-14 LAB — BASIC METABOLIC PANEL
Anion gap: 9 (ref 5–15)
BUN: 24 mg/dL — ABNORMAL HIGH (ref 8–23)
CO2: 23 mmol/L (ref 22–32)
Calcium: 9 mg/dL (ref 8.9–10.3)
Chloride: 108 mmol/L (ref 98–111)
Creatinine, Ser: 1.16 mg/dL — ABNORMAL HIGH (ref 0.44–1.00)
GFR, Estimated: 44 mL/min — ABNORMAL LOW (ref >60)
Glucose, Bld: 115 mg/dL — ABNORMAL HIGH (ref 70–99)
Potassium: 4.4 mmol/L (ref 3.5–5.1)
Sodium: 140 mmol/L (ref 135–145)

## 2022-10-14 LAB — CK: Total CK: 295 U/L — ABNORMAL HIGH (ref 38–234)

## 2022-10-14 LAB — GLUCOSE, CAPILLARY
Glucose-Capillary: 137 mg/dL — ABNORMAL HIGH (ref 70–99)
Glucose-Capillary: 128 mg/dL — ABNORMAL HIGH (ref 70–99)
Glucose-Capillary: 117 mg/dL — ABNORMAL HIGH (ref 70–99)
Glucose-Capillary: 159 mg/dL — ABNORMAL HIGH (ref 70–99)

## 2022-10-14 NOTE — Plan of Care (Signed)

## 2022-10-14 NOTE — Progress Notes (Signed)
PROGRESS NOTE    Kirsten Kelly  [id number] DOB: [birth year] DOA: 10/11/2022 PCP: Harlan Stains, MD   Brief Narrative:  Kirsten Kelly is a 89 y.o. female with medical history significant of dementia, type 2 diabetes, hypertension, hyperlipidemia, mood disorder, osteoporosis presented to the ED via EMS after being found on the ground from a possible fall and being called for a welfare check.  She was last seen by neighbors yesterday.  Patient told ED physician that she had a mechanical fall from tripping over her dog this morning, however, there was concern that this might have been a syncopal event as initially patient had no recollection of getting on the floor when EMS found her.    In the ED, vital signs stable.  Labs significant for WBC 12.5, hemoglobin 12.6, glucose 139, BUN 31, creatinine 1.4 (previously around 0.6 in 2021), CK 450, UA not suggestive of infection.  Chest x-ray showing chronic central vascular congestion and no acute finding.  Pelvic x-ray negative for fracture.  CT head negative for acute finding.  EKG showing sinus rhythm, borderline T wave abnormalities inferolaterally, PVCs.  No acute ischemic changes when compared to prior EKG from March 2018. Patient received 1 L LR bolus.  TRH called to admit.   Assessment & Plan:   Mechanical fall versus possible syncope -Chest x-ray showing chronic central vascular congestion and no acute finding.   -CT head negative for acute finding and no focal neurodeficit on exam.   -EKG showing sinus rhythm, borderline T wave abnormalities inferolaterally, PVCs.   -EEG resulted within normal limit.  Carotid ultrasound negative for any significant stenosis. -Echo shows ejection fraction of 60 to 65%.  No regional wall motion abnormalities.   -PT/OT recommended home health PT/OT  Mild rhabdomyolysis -In the setting of fall at home and being found on the floor.  CK 450.   -CK improving.  Continue IV fluids   AKI -In the setting of  mild rhabdomyolysis.  BUN 31, creatinine 1.4 (previously on 0.6 in 2021).   -Renal function is improving.  Continue IV fluids   Low back pain -X-ray showed no acute fracture.  Multilevel degenerative changes.   Mild leukocytosis -Resolved   Type 2 diabetes -Not on insulin.  A1c 7.0.  Sensitive sliding scale insulin ACHS.  Hold oral hypoglycemic agents at this time.   Hypertension -Currently normotensive.   Hyperlipidemia -Hold statin given rhabdomyolysis.   Constipation and vomiting -KUB shows nonobstructive bowel gas pattern.   Dementia: At baseline.  Follow delirium precautions.  DVT prophylaxis: SCD Code Status: Full code Family Communication:  None present at bedside.  Plan of care discussed with patient in length and she verbalized understanding and agreed with it. Disposition Plan: Likely home tomorrow  Consultants:  None  Procedures:  None  Antimicrobials:  None  Status is: Observation    Subjective: Patient seen and examined.  Sitting comfortably on the bed and eating breakfast.  Denies any new complaints.  No acute events overnight.  Objective: Vitals:   10/13/22 2325 10/14/22 0300 10/14/22 0346 10/14/22 0800  BP: 134/75  (!) 141/82 (!) 140/93  Pulse: 65  76 71  Resp: 18  20 17   Temp: 98.3 F (36.8 C)  98.2 F (36.8 C) 97.8 F (36.6 C)  TempSrc: Oral  Oral Oral  SpO2: 95%  95% 97%  Weight:  77.8 kg      Intake/Output Summary (Last 24 hours) at 10/14/2022 1138 Last data filed at 10/14/2022 0409 Gross per  24 hour  Intake 2317.39 ml  Output --  Net 2317.39 ml    Filed Weights   10/13/22 0017 10/14/22 0300  Weight: 76.8 kg 77.8 kg    Examination:  General exam: Appears calm and comfortable, on room air, communicating well Respiratory system: Clear to auscultation. Respiratory effort normal. Cardiovascular system: S1 & S2 heard, RRR. No JVD, murmurs, rubs, gallops or clicks. No pedal edema. Gastrointestinal system: Abdomen is  nondistended, soft and nontender. No organomegaly or masses felt. Normal bowel sounds heard. Central nervous system: Alert and oriented. No focal neurological deficits. Extremities: Symmetric 5 x 5 power. Skin: No rashes, lesions or ulcers Psychiatry: Judgement and insight appear normal. Mood & affect appropriate.    Data Reviewed: I have personally reviewed following labs and imaging studies  CBC: Recent Labs  Lab 10/11/22 1800 10/12/22 0211 10/13/22 0028  WBC 12.5* 12.7* 10.0  NEUTROABS 10.2*  --  6.7  HGB 12.6 12.4 12.1  HCT 39.1 39.1 37.7  MCV 90.7 92.2 91.1  PLT 174 171 [id number]    Basic Metabolic Panel: Recent Labs  Lab 10/11/22 1800 10/12/22 0211 10/13/22 0028 10/14/22 0059  NA 137 138 138 140  K 4.6 4.4 4.2 4.4  CL 101 101 103 108  CO2 24 28 24 23   GLUCOSE 139* 112* 136* 115*  BUN 31* 27* 24* 24*  CREATININE 1.42* 1.47* 1.44* 1.16*  CALCIUM 9.7 10.0 9.5 9.0  PHOS  --   --  3.4  --     GFR: CrCl cannot be calculated (Unknown ideal weight.). Liver Function Tests: Recent Labs  Lab 10/12/22 0211 10/13/22 0028  AST 42*  --   ALT 19  --   ALKPHOS 63  --   BILITOT 1.2  --   PROT 7.5  --   ALBUMIN 3.7 3.4*    No results for input(s): "LIPASE", "AMYLASE" in the last 168 hours. No results for input(s): "AMMONIA" in the last 168 hours. Coagulation Profile: No results for input(s): "INR", "PROTIME" in the last 168 hours. Cardiac Enzymes: Recent Labs  Lab 10/11/22 1800 10/12/22 0211 10/13/22 0028 10/14/22 0059  CKTOTAL 450* 501* 404* 295*    BNP (last 3 results) No results for input(s): "PROBNP" in the last 8760 hours. HbA1C: Recent Labs    10/12/22 0211  HGBA1C 7.0*    CBG: Recent Labs  Lab 10/13/22 0543 10/13/22 1725 10/13/22 2047 10/14/22 0545 10/14/22 1111  GLUCAP 147* 125* 174* 137* 128*    Lipid Profile: No results for input(s): "CHOL", "HDL", "LDLCALC", "TRIG", "CHOLHDL", "LDLDIRECT" in the last 72 hours. Thyroid Function  Tests: No results for input(s): "TSH", "T4TOTAL", "FREET4", "T3FREE", "THYROIDAB" in the last 72 hours. Anemia Panel: No results for input(s): "VITAMINB12", "FOLATE", "FERRITIN", "TIBC", "IRON", "RETICCTPCT" in the last 72 hours. Sepsis Labs: No results for input(s): "PROCALCITON", "LATICACIDVEN" in the last 168 hours.  No results found for this or any previous visit (from the past 240 hour(s)).    Radiology Studies: No results found.  Scheduled Meds:  insulin aspart  0-5 Units Subcutaneous QHS   insulin aspart  0-9 Units Subcutaneous TID WC   Continuous Infusions:  sodium chloride 125 mL/hr at 10/13/22 2201     LOS: 1 day   Time spent: 35 minutes   Wandy Bossler Loann Quill, MD [facility]  If 7PM-7AM, please contact night-coverage [email or web address] 10/14/2022, 11:38 AM

## 2022-10-15 DIAGNOSIS — Z794 Long term (current) use of insulin: Secondary | ICD-10-CM

## 2022-10-15 DIAGNOSIS — I159 Secondary hypertension, unspecified: Secondary | ICD-10-CM

## 2022-10-15 DIAGNOSIS — E119 Type 2 diabetes mellitus without complications: Secondary | ICD-10-CM

## 2022-10-15 DIAGNOSIS — N179 Acute kidney failure, unspecified: Secondary | ICD-10-CM | POA: Diagnosis not present

## 2022-10-15 DIAGNOSIS — W19XXXA Unspecified fall, initial encounter: Secondary | ICD-10-CM | POA: Diagnosis not present

## 2022-10-15 DIAGNOSIS — M545 Low back pain, unspecified: Secondary | ICD-10-CM

## 2022-10-15 DIAGNOSIS — Y92009 Unspecified place in unspecified non-institutional (private) residence as the place of occurrence of the external cause: Secondary | ICD-10-CM

## 2022-10-15 DIAGNOSIS — R55 Syncope and collapse: Secondary | ICD-10-CM | POA: Diagnosis not present

## 2022-10-15 DIAGNOSIS — M6282 Rhabdomyolysis: Secondary | ICD-10-CM

## 2022-10-15 DIAGNOSIS — E785 Hyperlipidemia, unspecified: Secondary | ICD-10-CM

## 2022-10-15 DIAGNOSIS — K5900 Constipation, unspecified: Secondary | ICD-10-CM

## 2022-10-15 LAB — BASIC METABOLIC PANEL
Anion gap: 7 (ref 5–15)
BUN: 27 mg/dL — ABNORMAL HIGH (ref 8–23)
CO2: 24 mmol/L (ref 22–32)
Calcium: 8.8 mg/dL — ABNORMAL LOW (ref 8.9–10.3)
Chloride: 105 mmol/L (ref 98–111)
Creatinine, Ser: 1.32 mg/dL — ABNORMAL HIGH (ref 0.44–1.00)
GFR, Estimated: 38 mL/min — ABNORMAL LOW (ref >60)
Glucose, Bld: 123 mg/dL — ABNORMAL HIGH (ref 70–99)
Potassium: 4.1 mmol/L (ref 3.5–5.1)
Sodium: 136 mmol/L (ref 135–145)

## 2022-10-15 LAB — GLUCOSE, CAPILLARY
Glucose-Capillary: 130 mg/dL — ABNORMAL HIGH (ref 70–99)
Glucose-Capillary: 133 mg/dL — ABNORMAL HIGH (ref 70–99)

## 2022-10-15 LAB — CK: Total CK: 227 U/L (ref 38–234)

## 2022-10-15 NOTE — Progress Notes (Signed)
Occupational Therapy Treatment Patient Details Name: Kirsten Kelly MRN: [id number] DOB: [birth year] Today's Date: 10/15/2022   History of present illness Pt is a 89 yo female admitted after being found on ground in house with front door open. Pt is unclear how she fell or how long she was down. Xrays and EEG normal.  Pt c/o back pain. Pt with AKI and mild rhabdo. PMH: DM, arthritis, dementia, HTN   OT comments  Pt progressing towards goals thi session, able to complete UB/LB bathing and simulated toilet/tub transfer with min guard-minA using RW. Pt propping leg up on chair to wash LB with unilateral UE support on counter. Pt with decreased cognition/safety awareness, needing repetitive cues to keep RW close for safety. Pt presenting with impairments listed below, will follow acutely. Recommend HHOT at d/c given pt has level of assist needed at home from caregiver.   Recommendations for follow up therapy are one component of a multi-disciplinary discharge planning process, led by the attending physician.  Recommendations may be updated based on patient status, additional functional criteria and insurance authorization.    Follow Up Recommendations  Home health OT     Assistance Recommended at Discharge Intermittent Supervision/Assistance  Patient can return home with the following  A little help with bathing/dressing/bathroom;Assistance with cooking/housework;Direct supervision/assist for medications management;Direct supervision/assist for financial management;Assist for transportation;Help with stairs or ramp for entrance   Equipment Recommendations  Other (comment) (RW)    Recommendations for Other Services PT consult    Precautions / Restrictions Precautions Precautions: Fall Precaution Comments: reports 3 falls since beginning of year Restrictions Weight Bearing Restrictions: No       Mobility Bed Mobility               General bed mobility comments: OOB in chair upon  arrival and departure    Transfers Overall transfer level: Needs assistance Equipment used: Rolling walker (2 wheels) Transfers: Sit to/from Stand Sit to Stand: Min guard                 Balance Overall balance assessment: Needs assistance Sitting-balance support: No upper extremity supported, Feet supported Sitting balance-Leahy Scale: Good     Standing balance support: Bilateral upper extremity supported, Reliant on assistive device for balance Standing balance-Leahy Scale: Poor                             ADL either performed or assessed with clinical judgement   ADL Overall ADL's : Needs assistance/impaired         Upper Body Bathing: Min guard;Standing   Lower Body Bathing: Minimal assistance;Sitting/lateral leans Lower Body Bathing Details (indicate cue type and reason): props LLE up on chair and holds onto countertop for UE support while washing BLE     Lower Body Dressing: Min guard;Sitting/lateral leans Lower Body Dressing Details (indicate cue type and reason): doffs sock in sitting using figure 4 to wash feet Toilet Transfer: Min guard;Ambulation;Regular Toilet;Rolling walker (2 wheels) Toilet Transfer Details (indicate cue type and reason): simulated     Tub/ Shower Transfer: Tub transfer;Minimal assistance;Ambulation;Rolling walker (2 wheels) Tub/Shower Transfer Details (indicate cue type and reason): simulated        Extremity/Trunk Assessment Upper Extremity Assessment Upper Extremity Assessment: Overall WFL for tasks assessed   Lower Extremity Assessment Lower Extremity Assessment: Defer to PT evaluation        Vision   Vision Assessment?: No apparent visual deficits   Perception  Perception Perception: Not tested   Praxis Praxis Praxis: Not tested    Cognition Arousal/Alertness: Awake/alert Behavior During Therapy: WFL for tasks assessed/performed Overall Cognitive Status: Impaired/Different from baseline Area of  Impairment: Orientation, Memory, Safety/judgement, Awareness                 Orientation Level: Disoriented to, Time   Memory: Decreased recall of precautions, Decreased short-term memory   Safety/Judgement: Decreased awareness of safety, Decreased awareness of deficits Awareness: Emergent   General Comments: history of dementia noted in chart        Exercises      Shoulder Instructions       General Comments VSS on RA    Pertinent Vitals/ Pain       Pain Assessment Pain Assessment: No/denies pain  Home Living                                          Prior Functioning/Environment              Frequency  Min 2X/week        Progress Toward Goals  OT Goals(current goals can now be found in the care plan section)  Progress towards OT goals: Progressing toward goals  Acute Rehab OT Goals Patient Stated Goal: to go home and see her dog OT Goal Formulation: With patient Time For Goal Achievement: 10/26/22 Potential to Achieve Goals: Good ADL Goals Pt Will Perform Grooming: with modified independence;standing Pt Will Perform Lower Body Bathing: with supervision;sit to/from stand Pt Will Perform Lower Body Dressing: with modified independence;sit to/from stand Pt Will Perform Tub/Shower Transfer: Tub transfer;with supervision;rolling walker;ambulating Additional ADL Goal #1: Pt will walk to bathroom with walker and complete all toileting with mod I  Plan Discharge plan remains appropriate;Frequency remains appropriate    Co-evaluation                 AM-PAC OT "6 Clicks" Daily Activity     Outcome Measure   Help from another person eating meals?: None Help from another person taking care of personal grooming?: None Help from another person toileting, which includes using toliet, bedpan, or urinal?: A Little Help from another person bathing (including washing, rinsing, drying)?: A Little Help from another person to put on and  taking off regular upper body clothing?: None Help from another person to put on and taking off regular lower body clothing?: A Little 6 Click Score: 21    End of Session Equipment Utilized During Treatment: Gait belt;Rolling walker (2 wheels)  OT Visit Diagnosis: Unsteadiness on feet (R26.81)   Activity Tolerance Patient tolerated treatment well   Patient Left in chair;with call bell/phone within reach;with chair alarm set   Nurse Communication Mobility status        Time: [id number] OT Time Calculation (min): 22 min  Charges: OT General Charges $OT Visit: 1 Visit OT Treatments $Self Care/Home Management : 8-22 mins  Renaye Rakers, OTD, OTR/L SecureChat Preferred Acute Rehab [phone]   Renaye Rakers [place] 10/15/2022, 10:46 AM

## 2022-10-15 NOTE — Progress Notes (Signed)
Physical Therapy Treatment Patient Details Name: Kirsten Kelly MRN: [id number] DOB: 06-08-1928 Today's Date: 10/15/2022   History of Present Illness Pt is a 89 yo female admitted after being found on ground in house with front door open. Pt is unclear how she fell or how long she was down. Xrays and EEG normal.  Pt c/o back pain. Pt with AKI and mild rhabdo. PMH: DM, arthritis, dementia, HTN    PT Comments    Pt received in chair, agreeable to therapy session and with good participation and tolerance for gait and transfer training. Pt needing up to min guard for safety with transfers/gait and able to progress gait distance in hallway this session with RW support. Emphasis on compliance with RW while home, RW adjusted for proper height, and safety with transfers/activity pacing. Pt continues to benefit from PT services to progress toward functional mobility goals.    Recommendations for follow up therapy are one component of a multi-disciplinary discharge planning process, led by the attending physician.  Recommendations may be updated based on patient status, additional functional criteria and insurance authorization.  Follow Up Recommendations  Home health PT     Assistance Recommended at Discharge Intermittent Supervision/Assistance  Patient can return home with the following A little help with bathing/dressing/bathroom;Assistance with cooking/housework;Direct supervision/assist for medications management;Direct supervision/assist for financial management;Assist for transportation;Help with stairs or ramp for entrance   Equipment Recommendations  Rolling walker (2 wheels)    Recommendations for Other Services       Precautions / Restrictions Precautions Precautions: Fall Precaution Comments: reports 3 falls since beginning of year Restrictions Weight Bearing Restrictions: No     Mobility  Bed Mobility               General bed mobility comments: OOB in chair upon  arrival and departure    Transfers Overall transfer level: Needs assistance Equipment used: Rolling walker (2 wheels) Transfers: Sit to/from Stand Sit to Stand: Min guard           General transfer comment: from chair<>RW, pt used momentum to achieve upright and needed ~3 attempts to boost herself up, min guard for safety.    Ambulation/Gait Ambulation/Gait assistance: Min guard Gait Distance (Feet): 150 Feet Assistive device: Rolling walker (2 wheels) Gait Pattern/deviations: Step-through pattern Gait velocity: reduced     General Gait Details: steady step-through gait with support of RW, min cues intermittently for wayfinding, upright posture and proximity to RW.   Stairs             Wheelchair Mobility    Modified Rankin (Stroke Patients Only)       Balance Overall balance assessment: Needs assistance Sitting-balance support: No upper extremity supported, Feet supported Sitting balance-Leahy Scale: Good     Standing balance support: Bilateral upper extremity supported, Reliant on assistive device for balance, During functional activity Standing balance-Leahy Scale: Fair Standing balance comment: fair with RW, poor without AD                            Cognition Arousal/Alertness: Awake/alert Behavior During Therapy: WFL for tasks assessed/performed Overall Cognitive Status: Impaired/Different from baseline Area of Impairment: Orientation, Memory, Safety/judgement, Awareness                 Orientation Level: Disoriented to, Time   Memory: Decreased recall of precautions, Decreased short-term memory   Safety/Judgement: Decreased awareness of safety, Decreased awareness of deficits Awareness: Emergent  General Comments: history of dementia noted in chart, pt able to carry on conversation re: DC plan but decreased safety at times and needing verbal cues often.        Exercises      General Comments General comments (skin  integrity, edema, etc.): SpO2/HR WFL on RA      Pertinent Vitals/Pain Pain Assessment Pain Assessment: No/denies pain     PT Goals (current goals can now be found in the care plan section) Acute Rehab PT Goals Patient Stated Goal: to go home PT Goal Formulation: With patient Time For Goal Achievement: 10/26/22 Progress towards PT goals: Progressing toward goals    Frequency    Min 3X/week      PT Plan Current plan remains appropriate       AM-PAC PT "6 Clicks" Mobility   Outcome Measure  Help needed turning from your back to your side while in a flat bed without using bedrails?: A Little Help needed moving from lying on your back to sitting on the side of a flat bed without using bedrails?: A Little Help needed moving to and from a bed to a chair (including a wheelchair)?: A Little Help needed standing up from a chair using your arms (e.g., wheelchair or bedside chair)?: A Little Help needed to walk in hospital room?: A Little Help needed climbing 3-5 steps with a railing? : A Little 6 Click Score: 18    End of Session Equipment Utilized During Treatment: Gait belt Activity Tolerance: Patient tolerated treatment well Patient left: in chair;with call bell/phone within reach;with chair alarm set;Other (comment) (reclined in chair) Nurse Communication: Mobility status PT Visit Diagnosis: Other abnormalities of gait and mobility (R26.89);Muscle weakness (generalized) (M62.81);History of falling (Z91.81)     Time: [id number] PT Time Calculation (min) (ACUTE ONLY): 8 min  Charges:  $Gait Training: 8-22 mins                     Kambri Dismore P., PTA Acute Rehabilitation Services Secure Chat Preferred 9a-5:30pm Office: [place] 10/15/2022, 1:48 PM

## 2022-10-15 NOTE — Plan of Care (Signed)

## 2022-10-15 NOTE — Progress Notes (Signed)
RN went to the patient's room x3 found IV unhooked with fluids running all over on the floor, bed, and bedside table (three separate times). RN explained the purpose of IV and instructed the patient each time to not unhook IV.

## 2022-10-15 NOTE — TOC Transition Note (Signed)
Transition of Care [facility]) - CM/SW Discharge Note   Patient Details  Name: Kirsten Kelly MRN: [id number] Date of Birth: May 21, 1928  Transition of Care [facility] CM/SW Contact:  Zenon Mayo, RN Phone Number: 10/15/2022, 10:10 AM   Clinical Narrative:    Patient is for dc today, NCM notified Tommi Rumps with [employer], and [employer] will bring rolling walker to bedside.  [place] who is the care giver gets off work at 1:30 and will transport patient home at that time.   Final next level of care: Home w Home Health Services Barriers to Discharge: Continued Medical Work up   Patient Goals and CMS Choice CMS Medicare.gov Compare Post Acute Care list provided to:: Patient Choice offered to / list presented to : Patient  Discharge Placement                         Discharge Plan and Services Additional resources added to the After Visit Summary for   In-house Referral: NA Discharge Planning Services: CM Consult Post Acute Care Choice: Home Health          DME Arranged: Gilford Rile rolling DME Agency: [employer] Date DME Agency Contacted: 10/12/22 Time DME Agency Contacted: [phone] Representative spoke with at DME Agency: Brenton Grills HH Arranged: RN, PT, OT [place] Agency: [place] Date [place]: 10/12/22 Time [place]: 1629 Representative spoke with at [place]: [place] Determinants of Health ([place]) Interventions SDOH Screenings   Tobacco Use: Low Risk  (10/12/2022)     Readmission Risk Interventions     No data to display

## 2022-10-15 NOTE — Discharge Summary (Signed)
Physician Discharge Summary  Kirsten Kelly [id number] DOB: 1928/02/10 DOA: 10/11/2022  PCP: Harlan Stains, MD  Admit date: 10/11/2022 Discharge date: 10/15/2022  Admitted From: Home  Discharge disposition: Home with home health   Recommendations for Outpatient Follow-Up:   Follow up with your primary care provider in one week.  Check CBC, BMP, magnesium in the next visit   Discharge Diagnosis:   Principal Problem:   Syncope Active Problems:   Type 2 diabetes mellitus (HCC)   HTN (hypertension)   Fall at home, initial encounter   Rhabdomyolysis   AKI (acute kidney injury) ([place])   Low back pain   Hyperlipidemia   Constipation   Discharge Condition: Improved.  Diet recommendation: Low sodium, heart healthy.  Carbohydrate-modified.   Wound care: None.  Code status: Full.   History of Present Illness:   Kirsten Kelly is a 89 y.o. female with medical history significant of dementia, type 2 diabetes, hypertension, hyperlipidemia, mood disorder, osteoporosis presented to the ED via EMS after being found on the ground from a possible fall and being called for a welfare check.   Patient told ED physician that she had a mechanical fall from tripping over her dog this morning, however, there was concern that this might have been a syncopal event as initially patient had no recollection of getting on the floor when EMS found her.  In the ED patient's vitals were stable.  Initial WBC was 12.5.  Creatinine 1.4 from previous 0.6.  CK was 450.  UA was unremarkable.  Chest x-ray showed chronic vascular congestion.  Pelvic x-ray was negative for fracture.  CT head was negative for acute findings.  EKG showed normal sinus rhythm.    Hospital Course:   Following conditions were addressed during hospitalization as listed below,  Mechanical fall versus possible syncope Chest x-ray showing chronic central vascular congestion.  CT head was unremarkable. EKG was unremarkable EEG was  within normal limits carotid duplex was negative for any hemodynamically significant stenosis.  2D echocardiogram showed EF of 60 to 65% with no regional wall motion abnormality.  PT OT has recommended home health PT, OT on discharge.    Mild rhabdomyolysis Improved after IV fluids.   AKI Received IV fluids and improved.   Low back pain X-ray showed no acute fracture.  Multilevel degenerative changes were noted..   Mild leukocytosis Resolved at this time.   Type 2 diabetes mellitus. -Not on insulin.  Hemoglobin A1c 7.0.  Oral hypoglycemic agents at home.  Hypertension On amlodipine and spironolactone at home.  Continue on discharge.   Hyperlipidemia Not on medication.   Constipation and vomiting -Improved.  KUB shows nonobstructive bowel gas pattern.   Dementia: At baseline.    Disposition.  At this time, patient is stable for disposition home with outpatient PCP follow-up.  Medical Consultants:   None.  Procedures:    None Subjective:   Today, patient was seen and examined at bedside.  Denies any nausea vomiting headache chills or rigor.  Wishes to go home.  Discharge Exam:   Vitals:   10/15/22 0344 10/15/22 1129  BP: (!) 147/78 (!) 156/75  Pulse:  63  Resp: 18 18  Temp: 98.2 F (36.8 C) 97.7 F (36.5 C)  SpO2: 100% 95%   Vitals:   10/14/22 1959 10/14/22 2336 10/15/22 0344 10/15/22 1129  BP: (!) 156/82 (!) 147/68 (!) 147/78 (!) 156/75  Pulse: 75 62  63  Resp: 18 18 18 18   Temp: 98.3 F (  36.8 C) 97.9 F (36.6 C) 98.2 F (36.8 C) 97.7 F (36.5 C)  TempSrc: Oral Oral Oral Oral  SpO2: 99% 99% 100% 95%  Weight:   77.3 kg    General: Alert awake, not in obvious distress, elderly female. HENT: pupils equally reacting to light,  No scleral pallor or icterus noted. Oral mucosa is moist.  Chest:  Clear breath sounds.  Diminished breath sounds bilaterally. No crackles or wheezes.  CVS: S1 &S2 heard. No murmur.  Regular rate and rhythm. Abdomen: Soft,  nontender, nondistended.  Bowel sounds are heard.   Extremities: No cyanosis, clubbing or edema.  Peripheral pulses are palpable. Psych: Alert, awake and oriented, normal mood CNS:  No cranial nerve deficits.  Power equal in all extremities.   Skin: Warm and dry.  No rashes noted.  The results of significant diagnostics from this hospitalization (including imaging, microbiology, ancillary and laboratory) are listed below for reference.     Diagnostic Studies:   ECHOCARDIOGRAM COMPLETE  Result Date: 10/12/2022    ECHOCARDIOGRAM REPORT   Patient Name:   [name] Date of Exam: 10/12/2022 Medical Rec #:  [id number]       Height:       66.0 in Accession #:    [id number]      Weight:       171.0 lb Date of Birth:  10/13/1927       BSA:          1.871 m Patient Age:    34 years        BP:           113/75 mmHg Patient Gender: F               HR:           60 bpm. Exam Location:  Inpatient Procedure: 2D Echo Indications:    syncope  History:        Patient has no prior history of Echocardiogram examinations.                 Risk Factors:Hypertension, Dyslipidemia and Diabetes.  Sonographer:    Harvie Junior Referring Phys: [id number] [place]  1. Left ventricular ejection fraction, by estimation, is 60 to 65%. The left ventricle has normal function. The left ventricle has no regional wall motion abnormalities. Left ventricular diastolic parameters are indeterminate.  2. Right ventricular systolic function is normal. The right ventricular size is normal. There is normal pulmonary artery systolic pressure.  3. The mitral valve is normal in structure. No evidence of mitral valve regurgitation. No evidence of mitral stenosis.  4. The aortic valve is tricuspid. Aortic valve regurgitation is not visualized. No aortic stenosis is present.  5. The inferior vena cava is normal in size with greater than 50% respiratory variability, suggesting right atrial pressure of 3 mmHg. Comparison(s): No prior  Echocardiogram. Conclusion(s)/Recommendation(s): Normal biventricular function without evidence of hemodynamically significant valvular heart disease. FINDINGS  Left Ventricle: Left ventricular ejection fraction, by estimation, is 60 to 65%. The left ventricle has normal function. The left ventricle has no regional wall motion abnormalities. The left ventricular internal cavity size was normal in size. There is  no left ventricular hypertrophy. Left ventricular diastolic parameters are indeterminate. Right Ventricle: The right ventricular size is normal. No increase in right ventricular wall thickness. Right ventricular systolic function is normal. There is normal pulmonary artery systolic pressure. The tricuspid regurgitant velocity is 2.84 m/s, and  with an assumed right  atrial pressure of 3 mmHg, the estimated right ventricular systolic pressure is [id number] mmHg. Left Atrium: Left atrial size was normal in size. Right Atrium: Right atrial size was normal in size. Pericardium: There is no evidence of pericardial effusion. Mitral Valve: The mitral valve is normal in structure. There is mild calcification of the mitral valve leaflet(s). No evidence of mitral valve regurgitation. No evidence of mitral valve stenosis. Tricuspid Valve: The tricuspid valve is grossly normal. Tricuspid valve regurgitation is trivial. No evidence of tricuspid stenosis. Aortic Valve: The aortic valve is tricuspid. Aortic valve regurgitation is not visualized. No aortic stenosis is present. Aortic valve mean gradient measures 3.0 mmHg. Aortic valve peak gradient measures 6.2 mmHg. Aortic valve area, by VTI measures 2.16 cm. Pulmonic Valve: The pulmonic valve was not well visualized. Pulmonic valve regurgitation is not visualized. No evidence of pulmonic stenosis. Aorta: The aortic root, ascending aorta, aortic arch and descending aorta are all structurally normal, with no evidence of dilitation or obstruction. Venous: The inferior vena cava is  normal in size with greater than 50% respiratory variability, suggesting right atrial pressure of 3 mmHg. IAS/Shunts: The atrial septum is grossly normal.  LEFT VENTRICLE PLAX 2D LVIDd:         4.20 cm     Diastology LVIDs:         2.60 cm     LV e' medial:    6.31 cm/s LV PW:         1.00 cm     LV E/e' medial:  13.8 LV IVS:        1.00 cm     LV e' lateral:   12.20 cm/s LVOT diam:     1.90 cm     LV E/e' lateral: 7.1 LV SV:         64 LV SV Index:   34 LVOT Area:     2.84 cm                             3D Volume EF: LV Volumes (MOD)           3D EF:        68 % LV vol d, MOD A2C: 59.7 ml LV EDV:       122 ml LV vol d, MOD A4C: 90.7 ml LV ESV:       39 ml LV vol s, MOD A2C: 26.6 ml LV SV:        83 ml LV vol s, MOD A4C: 34.5 ml LV SV MOD A2C:     33.1 ml LV SV MOD A4C:     90.7 ml LV SV MOD BP:      49.4 ml RIGHT VENTRICLE RV Basal diam:  3.10 cm RV Mid diam:    2.20 cm RV S prime:     14.85 cm/s TAPSE (M-mode): 2.0 cm LEFT ATRIUM             Index        RIGHT ATRIUM           Index LA diam:        2.90 cm 1.55 cm/m   RA Area:     12.90 cm LA Vol (A2C):   39.3 ml 21.00 ml/m  RA Volume:   28.30 ml  15.13 ml/m LA Vol (A4C):   53.9 ml 28.81 ml/m LA Biplane Vol: 45.8 ml 24.48 ml/m  AORTIC VALVE  PULMONIC VALVE AV Area (Vmax):    2.31 cm     PV Vmax:       0.66 m/s AV Area (Vmean):   2.12 cm     PV Peak grad:  1.7 mmHg AV Area (VTI):     2.16 cm AV Vmax:           124.00 cm/s AV Vmean:          84.200 cm/s AV VTI:            0.298 m AV Peak Grad:      6.2 mmHg AV Mean Grad:      3.0 mmHg LVOT Vmax:         101.00 cm/s LVOT Vmean:        62.900 cm/s LVOT VTI:          0.227 m LVOT/AV VTI ratio: 0.76  AORTA Ao Root diam: 3.60 cm Ao Asc diam:  3.50 cm MITRAL VALVE               TRICUSPID VALVE MV Area (PHT): 3.27 cm    TR Peak grad:   32.3 mmHg MV Decel Time: 232 msec    TR Vmax:        284.00 cm/s MV E velocity: 87.00 cm/s MV A velocity: 80.20 cm/s  SHUNTS MV E/A ratio:  1.08        Systemic  VTI:  0.23 m                            Systemic Diam: 1.90 cm Buford Dresser MD Electronically signed by Buford Dresser MD Signature Date/Time: 10/12/2022/3:02:24 PM    Final    VAS US CAROTID  Result Date: 10/12/2022 Carotid Arterial Duplex Study Patient Name:  Marshell Levan  Date of Exam:   10/12/2022 Medical Rec #: [id number]        Accession #:    [id number] Date of Birth: 10-Apr-1928        Patient Gender: F Patient Age:   13 years Exam Location:  [facility] Procedure:      VAS US CAROTID Referring Phys: Wandra Feinstein RATHORE --------------------------------------------------------------------------------  Indications:       Syncope and Weakness. Risk Factors:      Hypertension, hyperlipidemia, Diabetes. Comparison Study:  No prior study. Performing Technologist: McKayla Maag RVT, [place]  Examination Guidelines: A complete evaluation includes B-mode imaging, spectral Doppler, color Doppler, and power Doppler as needed of all accessible portions of each vessel. Bilateral testing is considered an integral part of a complete examination. Limited examinations for reoccurring indications may be performed as noted.  Right Carotid Findings: +----------+--------+--------+--------+------------------+------------------+           PSV cm/sEDV cm/sStenosisPlaque DescriptionComments           +----------+--------+--------+--------+------------------+------------------+ CCA Prox  76      13                                intimal thickening +----------+--------+--------+--------+------------------+------------------+ CCA Distal45      9                                 intimal thickening +----------+--------+--------+--------+------------------+------------------+ ICA Prox  42      12      1-39%  intimal thickening +----------+--------+--------+--------+------------------+------------------+ ICA Mid   76      18                                                    +----------+--------+--------+--------+------------------+------------------+ ICA Distal58      15                                                   +----------+--------+--------+--------+------------------+------------------+ ECA       121     12                                                   +----------+--------+--------+--------+------------------+------------------+ +----------+--------+-------+----------------+-------------------+           PSV cm/sEDV cmsDescribe        Arm Pressure (mmHG) +----------+--------+-------+----------------+-------------------+ [id number]            Multiphasic, WNL                    +----------+--------+-------+----------------+-------------------+ +---------+--------+--+--------+-+---------+ VertebralPSV cm/s33EDV cm/s7Antegrade +---------+--------+--+--------+-+---------+  Left Carotid Findings: +----------+--------+--------+--------+------------------+------------------+           PSV cm/sEDV cm/sStenosisPlaque DescriptionComments           +----------+--------+--------+--------+------------------+------------------+ CCA Prox  81      10                                                   +----------+--------+--------+--------+------------------+------------------+ CCA Distal63      12                                intimal thickening +----------+--------+--------+--------+------------------+------------------+ ICA Prox  58      8       1-39%                     intimal thickening +----------+--------+--------+--------+------------------+------------------+ ICA Mid   48      12                                                   +----------+--------+--------+--------+------------------+------------------+ ICA Distal54      13                                                   +----------+--------+--------+--------+------------------+------------------+ ECA       80      9                                                     +----------+--------+--------+--------+------------------+------------------+ +----------+--------+--------+----------------+-------------------+  PSV cm/sEDV cm/sDescribe        Arm Pressure (mmHG) +----------+--------+--------+----------------+-------------------+ [id number]              Multiphasic, WNL                    +----------+--------+--------+----------------+-------------------+ +---------+--------+--+--------+--+---------+ VertebralPSV cm/s79EDV cm/s14Antegrade +---------+--------+--+--------+--+---------+   Summary: Right Carotid: Velocities in the right ICA are consistent with a 1-39% stenosis. Left Carotid: Velocities in the left ICA are consistent with a 1-39% stenosis. Vertebrals:  Bilateral vertebral arteries demonstrate antegrade flow. Subclavians: Normal flow hemodynamics were seen in bilateral subclavian              arteries. *See table(s) above for measurements and observations.  Electronically signed by Servando Snare MD on 10/12/2022 at 10:42:24 AM.    Final    EEG adult  Result Date: 10/12/2022 Lora Havens, MD     10/12/2022  8:56 AM Patient Name: SHIELA ZAVALZA MRN: [id number] Epilepsy Attending: Lora Havens Referring Physician/Provider: Shela Leff, MD Date: 10/12/2022 Duration: 24.07 mins Patient history: 89yo F with syncope. EEG to evaluate for seizure Level of alertness: Awake AEDs during EEG study: None Technical aspects: This EEG study was done with scalp electrodes positioned according to the 10-20 International system of electrode placement. Electrical activity was reviewed with band pass filter of 1-70Hz , sensitivity of 7 uV/mm, display speed of 78mm/sec with a 60Hz  notched filter applied as appropriate. EEG data were recorded continuously and digitally stored.  Video monitoring was available and reviewed as appropriate. Description: The posterior dominant rhythm consists of 8 Hz activity of moderate  voltage (25-35 uV) seen predominantly in posterior head regions, symmetric and reactive to eye opening and eye closing. Physiologic photic driving was seen during photic stimulation.  Hyperventilation was not performed.   IMPRESSION: This study is within normal limits. No seizures or epileptiform discharges were seen throughout the recording. A normal interictal EEG does not exclude the diagnosis of epilepsy. Lora Havens   DG Abd 1 View  Result Date: 10/12/2022 CLINICAL DATA:  [id number] Fall [id number] 91320 Back pain 91320 EXAM: ABDOMEN - 1 VIEW COMPARISON:  CT abdomen pelvis 08/07/2011 FINDINGS: The bowel gas pattern is normal. No radio-opaque calculi or other significant radiographic abnormality are seen. Degenerative changes of the lumbar spine. Degenerative changes of the hips. No acute osseous abnormality with limited evaluation on this frontal view. IMPRESSION: Nonobstructed bowel gas pattern. Electronically Signed   By: Iven Finn M.D.   On: 10/12/2022 02:50   DG Lumbar Spine 2-3 Views  Result Date: 10/12/2022 CLINICAL DATA:  Possible fall. EXAM: LUMBAR SPINE - 2-3 VIEW COMPARISON:  09/21/2016 FINDINGS: There is no evidence of acute lumbar spine fracture. Stable chronic loss of height in the superior endplate at L2 is noted. Alignment is normal. Multilevel intervertebral disc space narrowing, degenerative endplate changes, and facet arthropathy. IMPRESSION: 1. No acute fracture. 2. Multilevel degenerative changes. Electronically Signed   By: Brett Fairy M.D.   On: 10/12/2022 02:34   CT Head Wo Contrast  Result Date: 10/11/2022 CLINICAL DATA:  Found down EXAM: CT HEAD WITHOUT CONTRAST TECHNIQUE: Contiguous axial images were obtained from the base of the skull through the vertex without intravenous contrast. RADIATION DOSE REDUCTION: This exam was performed according to the departmental dose-optimization program which includes automated exposure control, adjustment of the mA and/or kV  according to patient size and/or use of iterative reconstruction technique. COMPARISON:  09/21/2016 FINDINGS: Brain: There is no mass, hemorrhage or extra-axial  collection. The size and configuration of the ventricles and extra-axial CSF spaces are normal. There is hypoattenuation of the white matter, most commonly indicating chronic small vessel disease. Vascular: No abnormal hyperdensity of the major intracranial arteries or dural venous sinuses. No intracranial atherosclerosis. Skull: The visualized skull base, calvarium and extracranial soft tissues are normal. Sinuses/Orbits: No fluid levels or advanced mucosal thickening of the visualized paranasal sinuses. No mastoid or middle ear effusion. The orbits are normal. IMPRESSION: Chronic small vessel disease without acute intracranial abnormality. Electronically Signed   By: Ulyses Jarred M.D.   On: 10/11/2022 20:31   DG Chest Portable 1 View  Result Date: 10/11/2022 CLINICAL DATA:  [place], lower back pain EXAM: PORTABLE CHEST 1 VIEW COMPARISON:  09/21/2016 FINDINGS: Single frontal view of the chest demonstrates an unremarkable cardiac silhouette. There is stable prominence of the central pulmonary vasculature, without airspace disease, effusion, or pneumothorax. Atherosclerosis of the aortic arch. ORIF right humerus. No acute displaced fractures. IMPRESSION: 1. No acute intrathoracic process. 2. Chronic central vascular congestion. Electronically Signed   By: Randa Ngo M.D.   On: 10/11/2022 18:49   DG Pelvis Portable  Result Date: 10/11/2022 CLINICAL DATA:  [place], lower back pain EXAM: PORTABLE PELVIS 1-2 VIEWS COMPARISON:  08/07/2011 FINDINGS: Single frontal view of the pelvis includes both hips. There are no acute displaced fractures. Symmetrical bilateral hip osteoarthritis. Sacroiliac joints are unremarkable. Prominent degenerative changes within the lower lumbar spine. Soft tissues are unremarkable. IMPRESSION: 1. Degenerative changes as above.  No  acute displaced fracture. Electronically Signed   By: Randa Ngo M.D.   On: 10/11/2022 18:48     Labs:   Basic Metabolic Panel: Recent Labs  Lab 10/11/22 1800 10/12/22 0211 10/13/22 0028 10/14/22 0059 10/15/22 0058  NA 137 138 138 140 136  K 4.6 4.4 4.2 4.4 4.1  CL 101 101 103 108 105  CO2 24 28 24 23 24   GLUCOSE 139* 112* 136* 115* 123*  BUN 31* 27* 24* 24* 27*  CREATININE 1.42* 1.47* 1.44* 1.16* 1.32*  CALCIUM 9.7 10.0 9.5 9.0 8.8*  PHOS  --   --  3.4  --   --    GFR CrCl cannot be calculated (Unknown ideal weight.). Liver Function Tests: Recent Labs  Lab 10/12/22 0211 10/13/22 0028  AST 42*  --   ALT 19  --   ALKPHOS 63  --   BILITOT 1.2  --   PROT 7.5  --   ALBUMIN 3.7 3.4*   No results for input(s): "LIPASE", "AMYLASE" in the last 168 hours. No results for input(s): "AMMONIA" in the last 168 hours. Coagulation profile No results for input(s): "INR", "PROTIME" in the last 168 hours.  CBC: Recent Labs  Lab 10/11/22 1800 10/12/22 0211 10/13/22 0028  WBC 12.5* 12.7* 10.0  NEUTROABS 10.2*  --  6.7  HGB 12.6 12.4 12.1  HCT 39.1 39.1 37.7  MCV 90.7 92.2 91.1  PLT 174 171 169   Cardiac Enzymes: Recent Labs  Lab 10/11/22 1800 10/12/22 0211 10/13/22 0028 10/14/22 0059 10/15/22 0058  CKTOTAL 450* 501* 404* 295* 227   BNP: Invalid input(s): "POCBNP" CBG: Recent Labs  Lab 10/14/22 1111 10/14/22 1641 10/14/22 2052 10/15/22 0616 10/15/22 1128  GLUCAP 128* 117* 159* 130* 133*   D-Dimer No results for input(s): "DDIMER" in the last 72 hours. Hgb A1c No results for input(s): "HGBA1C" in the last 72 hours. Lipid Profile No results for input(s): "CHOL", "HDL", "LDLCALC", "TRIG", "CHOLHDL", "LDLDIRECT" in the  last 72 hours. Thyroid function studies No results for input(s): "TSH", "T4TOTAL", "T3FREE", "THYROIDAB" in the last 72 hours.  Invalid input(s): "FREET3" Anemia work up No results for input(s): "VITAMINB12", "FOLATE", "FERRITIN",  "TIBC", "IRON", "RETICCTPCT" in the last 72 hours. Microbiology No results found for this or any previous visit (from the past 240 hour(s)).   Discharge Instructions:   Discharge Instructions     Diet Carb Modified   Complete by: As directed    Discharge instructions   Complete by: As directed    Follow up with your primary care provider in 1 week.  Check blood work at that time.  Take time to change positions especially from lying to standing up.  Increase hydration.  Seek medical attention for worsening symptoms.   Increase activity slowly   Complete by: As directed       Allergies as of 10/15/2022   No Known Allergies      Medication List     TAKE these medications    acetaminophen 325 MG tablet Commonly known as: TYLENOL Take 650 mg by mouth daily as needed for moderate pain, fever or headache.   amLODipine 10 MG tablet Commonly known as: NORVASC Take 10 mg by mouth daily.   spironolactone 50 MG tablet Commonly known as: ALDACTONE Take 50 mg by mouth daily.               Durable Medical Equipment  (From admission, onward)           Start     Ordered   10/12/22 1628  For home use only DME Walker rolling  Once       Question Answer Comment  Walker: With 5 Inch Wheels   Patient needs a walker to treat with the following condition Weakness      10/12/22 1628   10/12/22 1027  For home use only DME Walker rolling  Once       Question Answer Comment  Walker: With 5 Inch Wheels   Patient needs a walker to treat with the following condition Rhabdomyolysis      10/12/22 1026            Follow-up Information     [facility] Follow up.   Specialty: [place] Why: Agency will call you to set up apt times Contact information: [address] [place] [address] [phone]         [facility] Follow up.   Why: rolling walker, [address], [facility] .   Contact  information: [address] [place] [id number] [phone]         Harlan Stains, MD Follow up in 1 week(s).   Specialty: Family Medicine Contact information: [address], [place] [address] [phone]                  Time coordinating discharge: 39 minutes  Signed:  Ande Therrell  [facility] 10/15/2022, 5:02 PM

## 2022-10-22 ENCOUNTER — Encounter: Payer: Self-pay | Admitting: Podiatry

## 2022-10-22 ENCOUNTER — Ambulatory Visit (INDEPENDENT_AMBULATORY_CARE_PROVIDER_SITE_OTHER): Payer: Medicare HMO | Admitting: Podiatry

## 2022-10-22 DIAGNOSIS — B351 Tinea unguium: Secondary | ICD-10-CM | POA: Diagnosis not present

## 2022-10-22 DIAGNOSIS — E119 Type 2 diabetes mellitus without complications: Secondary | ICD-10-CM

## 2022-10-22 DIAGNOSIS — M79609 Pain in unspecified limb: Secondary | ICD-10-CM | POA: Diagnosis not present

## 2022-10-22 NOTE — Progress Notes (Signed)
This patient returns to my office for at risk foot care.  This patient requires this care by a professional since this patient will be at risk due to having diabetes mellitus.    This patient is unable to cut nails herself since the patient cannot reach her nails.These nails are painful walking and wearing shoes.  This patient presents for at risk foot care today.   General Appearance  Alert, conversant and in no acute stress.  Vascular  Dorsalis pedis and posterior tibial  pulses are palpable  bilaterally.  Capillary return is within normal limits  bilaterally. Temperature is within normal limits  bilaterally.  Neurologic  Senn-Weinstein monofilament wire test diminished   bilaterally. Muscle power within normal limits bilaterally.  Nails Thick disfigured discolored nails with subungual debris  from hallux to fifth toes bilaterally. No evidence of bacterial infection or drainage bilaterally.  Orthopedic  No limitations of motion  feet .  No crepitus or effusions noted.  No bony pathology or digital deformities noted.  HAV  B/L.  Skin  normotropic skin with no porokeratosis noted bilaterally.  No signs of infections or ulcers noted.     Onychomycosis  Pain in right toes  Pain in left toes  Consent was obtained for treatment procedures.   Mechanical debridement of nails 1-5  bilaterally performed with a nail nipper.  Filed with dremel without incident.    Return office visit   3 months                 Told patient to return for periodic foot care and evaluation due to potential at risk complications.   Gardiner Barefoot DPM

## 2022-10-24 DIAGNOSIS — E785 Hyperlipidemia, unspecified: Secondary | ICD-10-CM | POA: Diagnosis not present

## 2022-10-24 DIAGNOSIS — N1831 Chronic kidney disease, stage 3a: Secondary | ICD-10-CM | POA: Diagnosis not present

## 2022-10-24 DIAGNOSIS — N183 Chronic kidney disease, stage 3 unspecified: Secondary | ICD-10-CM | POA: Diagnosis not present

## 2022-10-24 DIAGNOSIS — Z09 Encounter for follow-up examination after completed treatment for conditions other than malignant neoplasm: Secondary | ICD-10-CM | POA: Diagnosis not present

## 2022-10-24 DIAGNOSIS — F0390 Unspecified dementia without behavioral disturbance: Secondary | ICD-10-CM | POA: Diagnosis not present

## 2022-10-24 DIAGNOSIS — H9193 Unspecified hearing loss, bilateral: Secondary | ICD-10-CM | POA: Diagnosis not present

## 2022-10-24 DIAGNOSIS — F321 Major depressive disorder, single episode, moderate: Secondary | ICD-10-CM | POA: Diagnosis not present

## 2022-10-24 DIAGNOSIS — E1122 Type 2 diabetes mellitus with diabetic chronic kidney disease: Secondary | ICD-10-CM | POA: Diagnosis not present

## 2022-10-24 DIAGNOSIS — I129 Hypertensive chronic kidney disease with stage 1 through stage 4 chronic kidney disease, or unspecified chronic kidney disease: Secondary | ICD-10-CM | POA: Diagnosis not present

## 2022-10-25 DIAGNOSIS — I129 Hypertensive chronic kidney disease with stage 1 through stage 4 chronic kidney disease, or unspecified chronic kidney disease: Secondary | ICD-10-CM | POA: Diagnosis not present

## 2022-10-25 DIAGNOSIS — E1122 Type 2 diabetes mellitus with diabetic chronic kidney disease: Secondary | ICD-10-CM | POA: Diagnosis not present

## 2022-10-25 DIAGNOSIS — N179 Acute kidney failure, unspecified: Secondary | ICD-10-CM | POA: Diagnosis not present

## 2022-10-25 DIAGNOSIS — E1142 Type 2 diabetes mellitus with diabetic polyneuropathy: Secondary | ICD-10-CM | POA: Diagnosis not present

## 2022-10-25 DIAGNOSIS — E785 Hyperlipidemia, unspecified: Secondary | ICD-10-CM | POA: Diagnosis not present

## 2022-10-25 DIAGNOSIS — T796XXD Traumatic ischemia of muscle, subsequent encounter: Secondary | ICD-10-CM | POA: Diagnosis not present

## 2022-10-25 DIAGNOSIS — F0393 Unspecified dementia, unspecified severity, with mood disturbance: Secondary | ICD-10-CM | POA: Diagnosis not present

## 2022-10-25 DIAGNOSIS — F321 Major depressive disorder, single episode, moderate: Secondary | ICD-10-CM | POA: Diagnosis not present

## 2022-10-25 DIAGNOSIS — N1831 Chronic kidney disease, stage 3a: Secondary | ICD-10-CM | POA: Diagnosis not present

## 2022-10-26 DIAGNOSIS — E1122 Type 2 diabetes mellitus with diabetic chronic kidney disease: Secondary | ICD-10-CM | POA: Diagnosis not present

## 2022-10-26 DIAGNOSIS — F321 Major depressive disorder, single episode, moderate: Secondary | ICD-10-CM | POA: Diagnosis not present

## 2022-10-26 DIAGNOSIS — N1831 Chronic kidney disease, stage 3a: Secondary | ICD-10-CM | POA: Diagnosis not present

## 2022-10-26 DIAGNOSIS — T796XXD Traumatic ischemia of muscle, subsequent encounter: Secondary | ICD-10-CM | POA: Diagnosis not present

## 2022-10-26 DIAGNOSIS — E785 Hyperlipidemia, unspecified: Secondary | ICD-10-CM | POA: Diagnosis not present

## 2022-10-26 DIAGNOSIS — N179 Acute kidney failure, unspecified: Secondary | ICD-10-CM | POA: Diagnosis not present

## 2022-10-26 DIAGNOSIS — E1142 Type 2 diabetes mellitus with diabetic polyneuropathy: Secondary | ICD-10-CM | POA: Diagnosis not present

## 2022-10-26 DIAGNOSIS — F0393 Unspecified dementia, unspecified severity, with mood disturbance: Secondary | ICD-10-CM | POA: Diagnosis not present

## 2022-10-26 DIAGNOSIS — I129 Hypertensive chronic kidney disease with stage 1 through stage 4 chronic kidney disease, or unspecified chronic kidney disease: Secondary | ICD-10-CM | POA: Diagnosis not present

## 2022-10-29 DIAGNOSIS — T796XXD Traumatic ischemia of muscle, subsequent encounter: Secondary | ICD-10-CM | POA: Diagnosis not present

## 2022-10-29 DIAGNOSIS — E1122 Type 2 diabetes mellitus with diabetic chronic kidney disease: Secondary | ICD-10-CM | POA: Diagnosis not present

## 2022-10-29 DIAGNOSIS — E785 Hyperlipidemia, unspecified: Secondary | ICD-10-CM | POA: Diagnosis not present

## 2022-10-29 DIAGNOSIS — F321 Major depressive disorder, single episode, moderate: Secondary | ICD-10-CM | POA: Diagnosis not present

## 2022-10-29 DIAGNOSIS — N179 Acute kidney failure, unspecified: Secondary | ICD-10-CM | POA: Diagnosis not present

## 2022-10-29 DIAGNOSIS — I129 Hypertensive chronic kidney disease with stage 1 through stage 4 chronic kidney disease, or unspecified chronic kidney disease: Secondary | ICD-10-CM | POA: Diagnosis not present

## 2022-10-29 DIAGNOSIS — F0393 Unspecified dementia, unspecified severity, with mood disturbance: Secondary | ICD-10-CM | POA: Diagnosis not present

## 2022-10-29 DIAGNOSIS — N1831 Chronic kidney disease, stage 3a: Secondary | ICD-10-CM | POA: Diagnosis not present

## 2022-10-29 DIAGNOSIS — E1142 Type 2 diabetes mellitus with diabetic polyneuropathy: Secondary | ICD-10-CM | POA: Diagnosis not present

## 2022-10-30 DIAGNOSIS — I129 Hypertensive chronic kidney disease with stage 1 through stage 4 chronic kidney disease, or unspecified chronic kidney disease: Secondary | ICD-10-CM | POA: Diagnosis not present

## 2022-10-30 DIAGNOSIS — F321 Major depressive disorder, single episode, moderate: Secondary | ICD-10-CM | POA: Diagnosis not present

## 2022-10-30 DIAGNOSIS — F0393 Unspecified dementia, unspecified severity, with mood disturbance: Secondary | ICD-10-CM | POA: Diagnosis not present

## 2022-10-30 DIAGNOSIS — N179 Acute kidney failure, unspecified: Secondary | ICD-10-CM | POA: Diagnosis not present

## 2022-10-30 DIAGNOSIS — E785 Hyperlipidemia, unspecified: Secondary | ICD-10-CM | POA: Diagnosis not present

## 2022-10-30 DIAGNOSIS — N1831 Chronic kidney disease, stage 3a: Secondary | ICD-10-CM | POA: Diagnosis not present

## 2022-10-30 DIAGNOSIS — T796XXD Traumatic ischemia of muscle, subsequent encounter: Secondary | ICD-10-CM | POA: Diagnosis not present

## 2022-10-30 DIAGNOSIS — E1142 Type 2 diabetes mellitus with diabetic polyneuropathy: Secondary | ICD-10-CM | POA: Diagnosis not present

## 2022-10-30 DIAGNOSIS — E1122 Type 2 diabetes mellitus with diabetic chronic kidney disease: Secondary | ICD-10-CM | POA: Diagnosis not present

## 2022-10-31 DIAGNOSIS — F0393 Unspecified dementia, unspecified severity, with mood disturbance: Secondary | ICD-10-CM | POA: Diagnosis not present

## 2022-10-31 DIAGNOSIS — E1122 Type 2 diabetes mellitus with diabetic chronic kidney disease: Secondary | ICD-10-CM | POA: Diagnosis not present

## 2022-10-31 DIAGNOSIS — T796XXD Traumatic ischemia of muscle, subsequent encounter: Secondary | ICD-10-CM | POA: Diagnosis not present

## 2022-10-31 DIAGNOSIS — I129 Hypertensive chronic kidney disease with stage 1 through stage 4 chronic kidney disease, or unspecified chronic kidney disease: Secondary | ICD-10-CM | POA: Diagnosis not present

## 2022-10-31 DIAGNOSIS — F321 Major depressive disorder, single episode, moderate: Secondary | ICD-10-CM | POA: Diagnosis not present

## 2022-10-31 DIAGNOSIS — N179 Acute kidney failure, unspecified: Secondary | ICD-10-CM | POA: Diagnosis not present

## 2022-10-31 DIAGNOSIS — E785 Hyperlipidemia, unspecified: Secondary | ICD-10-CM | POA: Diagnosis not present

## 2022-10-31 DIAGNOSIS — E1142 Type 2 diabetes mellitus with diabetic polyneuropathy: Secondary | ICD-10-CM | POA: Diagnosis not present

## 2022-10-31 DIAGNOSIS — N1831 Chronic kidney disease, stage 3a: Secondary | ICD-10-CM | POA: Diagnosis not present

## 2022-11-01 DIAGNOSIS — T796XXD Traumatic ischemia of muscle, subsequent encounter: Secondary | ICD-10-CM | POA: Diagnosis not present

## 2022-11-01 DIAGNOSIS — E1122 Type 2 diabetes mellitus with diabetic chronic kidney disease: Secondary | ICD-10-CM | POA: Diagnosis not present

## 2022-11-01 DIAGNOSIS — E1142 Type 2 diabetes mellitus with diabetic polyneuropathy: Secondary | ICD-10-CM | POA: Diagnosis not present

## 2022-11-01 DIAGNOSIS — E785 Hyperlipidemia, unspecified: Secondary | ICD-10-CM | POA: Diagnosis not present

## 2022-11-01 DIAGNOSIS — F0393 Unspecified dementia, unspecified severity, with mood disturbance: Secondary | ICD-10-CM | POA: Diagnosis not present

## 2022-11-01 DIAGNOSIS — N1831 Chronic kidney disease, stage 3a: Secondary | ICD-10-CM | POA: Diagnosis not present

## 2022-11-01 DIAGNOSIS — F321 Major depressive disorder, single episode, moderate: Secondary | ICD-10-CM | POA: Diagnosis not present

## 2022-11-01 DIAGNOSIS — I129 Hypertensive chronic kidney disease with stage 1 through stage 4 chronic kidney disease, or unspecified chronic kidney disease: Secondary | ICD-10-CM | POA: Diagnosis not present

## 2022-11-01 DIAGNOSIS — N179 Acute kidney failure, unspecified: Secondary | ICD-10-CM | POA: Diagnosis not present

## 2022-11-02 DIAGNOSIS — D649 Anemia, unspecified: Secondary | ICD-10-CM | POA: Diagnosis not present

## 2022-11-02 DIAGNOSIS — E860 Dehydration: Secondary | ICD-10-CM | POA: Diagnosis not present

## 2022-11-05 DIAGNOSIS — E119 Type 2 diabetes mellitus without complications: Secondary | ICD-10-CM | POA: Diagnosis not present

## 2022-11-07 DIAGNOSIS — E785 Hyperlipidemia, unspecified: Secondary | ICD-10-CM | POA: Diagnosis not present

## 2022-11-07 DIAGNOSIS — F321 Major depressive disorder, single episode, moderate: Secondary | ICD-10-CM | POA: Diagnosis not present

## 2022-11-07 DIAGNOSIS — T796XXD Traumatic ischemia of muscle, subsequent encounter: Secondary | ICD-10-CM | POA: Diagnosis not present

## 2022-11-07 DIAGNOSIS — E1122 Type 2 diabetes mellitus with diabetic chronic kidney disease: Secondary | ICD-10-CM | POA: Diagnosis not present

## 2022-11-07 DIAGNOSIS — N179 Acute kidney failure, unspecified: Secondary | ICD-10-CM | POA: Diagnosis not present

## 2022-11-07 DIAGNOSIS — N1831 Chronic kidney disease, stage 3a: Secondary | ICD-10-CM | POA: Diagnosis not present

## 2022-11-07 DIAGNOSIS — F0393 Unspecified dementia, unspecified severity, with mood disturbance: Secondary | ICD-10-CM | POA: Diagnosis not present

## 2022-11-07 DIAGNOSIS — I129 Hypertensive chronic kidney disease with stage 1 through stage 4 chronic kidney disease, or unspecified chronic kidney disease: Secondary | ICD-10-CM | POA: Diagnosis not present

## 2022-11-07 DIAGNOSIS — E1142 Type 2 diabetes mellitus with diabetic polyneuropathy: Secondary | ICD-10-CM | POA: Diagnosis not present

## 2022-11-08 DIAGNOSIS — F321 Major depressive disorder, single episode, moderate: Secondary | ICD-10-CM | POA: Diagnosis not present

## 2022-11-08 DIAGNOSIS — F0393 Unspecified dementia, unspecified severity, with mood disturbance: Secondary | ICD-10-CM | POA: Diagnosis not present

## 2022-11-08 DIAGNOSIS — E785 Hyperlipidemia, unspecified: Secondary | ICD-10-CM | POA: Diagnosis not present

## 2022-11-08 DIAGNOSIS — I129 Hypertensive chronic kidney disease with stage 1 through stage 4 chronic kidney disease, or unspecified chronic kidney disease: Secondary | ICD-10-CM | POA: Diagnosis not present

## 2022-11-08 DIAGNOSIS — E1122 Type 2 diabetes mellitus with diabetic chronic kidney disease: Secondary | ICD-10-CM | POA: Diagnosis not present

## 2022-11-08 DIAGNOSIS — T796XXD Traumatic ischemia of muscle, subsequent encounter: Secondary | ICD-10-CM | POA: Diagnosis not present

## 2022-11-08 DIAGNOSIS — N179 Acute kidney failure, unspecified: Secondary | ICD-10-CM | POA: Diagnosis not present

## 2022-11-08 DIAGNOSIS — E1142 Type 2 diabetes mellitus with diabetic polyneuropathy: Secondary | ICD-10-CM | POA: Diagnosis not present

## 2022-11-08 DIAGNOSIS — N1831 Chronic kidney disease, stage 3a: Secondary | ICD-10-CM | POA: Diagnosis not present

## 2022-11-09 DIAGNOSIS — E785 Hyperlipidemia, unspecified: Secondary | ICD-10-CM | POA: Diagnosis not present

## 2022-11-09 DIAGNOSIS — F321 Major depressive disorder, single episode, moderate: Secondary | ICD-10-CM | POA: Diagnosis not present

## 2022-11-09 DIAGNOSIS — T796XXD Traumatic ischemia of muscle, subsequent encounter: Secondary | ICD-10-CM | POA: Diagnosis not present

## 2022-11-09 DIAGNOSIS — E1142 Type 2 diabetes mellitus with diabetic polyneuropathy: Secondary | ICD-10-CM | POA: Diagnosis not present

## 2022-11-09 DIAGNOSIS — I129 Hypertensive chronic kidney disease with stage 1 through stage 4 chronic kidney disease, or unspecified chronic kidney disease: Secondary | ICD-10-CM | POA: Diagnosis not present

## 2022-11-09 DIAGNOSIS — E1122 Type 2 diabetes mellitus with diabetic chronic kidney disease: Secondary | ICD-10-CM | POA: Diagnosis not present

## 2022-11-09 DIAGNOSIS — N1831 Chronic kidney disease, stage 3a: Secondary | ICD-10-CM | POA: Diagnosis not present

## 2022-11-09 DIAGNOSIS — N179 Acute kidney failure, unspecified: Secondary | ICD-10-CM | POA: Diagnosis not present

## 2022-11-09 DIAGNOSIS — F0393 Unspecified dementia, unspecified severity, with mood disturbance: Secondary | ICD-10-CM | POA: Diagnosis not present

## 2022-11-13 DIAGNOSIS — N1831 Chronic kidney disease, stage 3a: Secondary | ICD-10-CM | POA: Diagnosis not present

## 2022-11-13 DIAGNOSIS — E785 Hyperlipidemia, unspecified: Secondary | ICD-10-CM | POA: Diagnosis not present

## 2022-11-13 DIAGNOSIS — E1142 Type 2 diabetes mellitus with diabetic polyneuropathy: Secondary | ICD-10-CM | POA: Diagnosis not present

## 2022-11-13 DIAGNOSIS — T796XXD Traumatic ischemia of muscle, subsequent encounter: Secondary | ICD-10-CM | POA: Diagnosis not present

## 2022-11-13 DIAGNOSIS — N179 Acute kidney failure, unspecified: Secondary | ICD-10-CM | POA: Diagnosis not present

## 2022-11-13 DIAGNOSIS — F0393 Unspecified dementia, unspecified severity, with mood disturbance: Secondary | ICD-10-CM | POA: Diagnosis not present

## 2022-11-13 DIAGNOSIS — F321 Major depressive disorder, single episode, moderate: Secondary | ICD-10-CM | POA: Diagnosis not present

## 2022-11-13 DIAGNOSIS — E1122 Type 2 diabetes mellitus with diabetic chronic kidney disease: Secondary | ICD-10-CM | POA: Diagnosis not present

## 2022-11-13 DIAGNOSIS — I129 Hypertensive chronic kidney disease with stage 1 through stage 4 chronic kidney disease, or unspecified chronic kidney disease: Secondary | ICD-10-CM | POA: Diagnosis not present

## 2022-11-14 DIAGNOSIS — N1831 Chronic kidney disease, stage 3a: Secondary | ICD-10-CM | POA: Diagnosis not present

## 2022-11-14 DIAGNOSIS — N179 Acute kidney failure, unspecified: Secondary | ICD-10-CM | POA: Diagnosis not present

## 2022-11-14 DIAGNOSIS — F0393 Unspecified dementia, unspecified severity, with mood disturbance: Secondary | ICD-10-CM | POA: Diagnosis not present

## 2022-11-14 DIAGNOSIS — E1122 Type 2 diabetes mellitus with diabetic chronic kidney disease: Secondary | ICD-10-CM | POA: Diagnosis not present

## 2022-11-14 DIAGNOSIS — E785 Hyperlipidemia, unspecified: Secondary | ICD-10-CM | POA: Diagnosis not present

## 2022-11-14 DIAGNOSIS — I129 Hypertensive chronic kidney disease with stage 1 through stage 4 chronic kidney disease, or unspecified chronic kidney disease: Secondary | ICD-10-CM | POA: Diagnosis not present

## 2022-11-14 DIAGNOSIS — T796XXD Traumatic ischemia of muscle, subsequent encounter: Secondary | ICD-10-CM | POA: Diagnosis not present

## 2022-11-14 DIAGNOSIS — E1142 Type 2 diabetes mellitus with diabetic polyneuropathy: Secondary | ICD-10-CM | POA: Diagnosis not present

## 2022-11-14 DIAGNOSIS — F321 Major depressive disorder, single episode, moderate: Secondary | ICD-10-CM | POA: Diagnosis not present

## 2022-11-15 DIAGNOSIS — T796XXD Traumatic ischemia of muscle, subsequent encounter: Secondary | ICD-10-CM | POA: Diagnosis not present

## 2022-11-15 DIAGNOSIS — N179 Acute kidney failure, unspecified: Secondary | ICD-10-CM | POA: Diagnosis not present

## 2022-11-15 DIAGNOSIS — N1831 Chronic kidney disease, stage 3a: Secondary | ICD-10-CM | POA: Diagnosis not present

## 2022-11-15 DIAGNOSIS — E1122 Type 2 diabetes mellitus with diabetic chronic kidney disease: Secondary | ICD-10-CM | POA: Diagnosis not present

## 2022-11-15 DIAGNOSIS — E785 Hyperlipidemia, unspecified: Secondary | ICD-10-CM | POA: Diagnosis not present

## 2022-11-15 DIAGNOSIS — E1142 Type 2 diabetes mellitus with diabetic polyneuropathy: Secondary | ICD-10-CM | POA: Diagnosis not present

## 2022-11-15 DIAGNOSIS — F0393 Unspecified dementia, unspecified severity, with mood disturbance: Secondary | ICD-10-CM | POA: Diagnosis not present

## 2022-11-15 DIAGNOSIS — F321 Major depressive disorder, single episode, moderate: Secondary | ICD-10-CM | POA: Diagnosis not present

## 2022-11-15 DIAGNOSIS — I129 Hypertensive chronic kidney disease with stage 1 through stage 4 chronic kidney disease, or unspecified chronic kidney disease: Secondary | ICD-10-CM | POA: Diagnosis not present

## 2022-11-16 DIAGNOSIS — N179 Acute kidney failure, unspecified: Secondary | ICD-10-CM | POA: Diagnosis not present

## 2022-11-16 DIAGNOSIS — E785 Hyperlipidemia, unspecified: Secondary | ICD-10-CM | POA: Diagnosis not present

## 2022-11-16 DIAGNOSIS — E1142 Type 2 diabetes mellitus with diabetic polyneuropathy: Secondary | ICD-10-CM | POA: Diagnosis not present

## 2022-11-16 DIAGNOSIS — T796XXD Traumatic ischemia of muscle, subsequent encounter: Secondary | ICD-10-CM | POA: Diagnosis not present

## 2022-11-16 DIAGNOSIS — F0393 Unspecified dementia, unspecified severity, with mood disturbance: Secondary | ICD-10-CM | POA: Diagnosis not present

## 2022-11-16 DIAGNOSIS — I129 Hypertensive chronic kidney disease with stage 1 through stage 4 chronic kidney disease, or unspecified chronic kidney disease: Secondary | ICD-10-CM | POA: Diagnosis not present

## 2022-11-16 DIAGNOSIS — E1122 Type 2 diabetes mellitus with diabetic chronic kidney disease: Secondary | ICD-10-CM | POA: Diagnosis not present

## 2022-11-16 DIAGNOSIS — N1831 Chronic kidney disease, stage 3a: Secondary | ICD-10-CM | POA: Diagnosis not present

## 2022-11-16 DIAGNOSIS — F321 Major depressive disorder, single episode, moderate: Secondary | ICD-10-CM | POA: Diagnosis not present

## 2022-11-22 DIAGNOSIS — I129 Hypertensive chronic kidney disease with stage 1 through stage 4 chronic kidney disease, or unspecified chronic kidney disease: Secondary | ICD-10-CM | POA: Diagnosis not present

## 2022-11-22 DIAGNOSIS — F0393 Unspecified dementia, unspecified severity, with mood disturbance: Secondary | ICD-10-CM | POA: Diagnosis not present

## 2022-11-22 DIAGNOSIS — E785 Hyperlipidemia, unspecified: Secondary | ICD-10-CM | POA: Diagnosis not present

## 2022-11-22 DIAGNOSIS — E1122 Type 2 diabetes mellitus with diabetic chronic kidney disease: Secondary | ICD-10-CM | POA: Diagnosis not present

## 2022-11-22 DIAGNOSIS — N179 Acute kidney failure, unspecified: Secondary | ICD-10-CM | POA: Diagnosis not present

## 2022-11-22 DIAGNOSIS — F321 Major depressive disorder, single episode, moderate: Secondary | ICD-10-CM | POA: Diagnosis not present

## 2022-11-22 DIAGNOSIS — N1831 Chronic kidney disease, stage 3a: Secondary | ICD-10-CM | POA: Diagnosis not present

## 2022-11-22 DIAGNOSIS — E1142 Type 2 diabetes mellitus with diabetic polyneuropathy: Secondary | ICD-10-CM | POA: Diagnosis not present

## 2022-11-22 DIAGNOSIS — T796XXD Traumatic ischemia of muscle, subsequent encounter: Secondary | ICD-10-CM | POA: Diagnosis not present

## 2022-11-29 DIAGNOSIS — E785 Hyperlipidemia, unspecified: Secondary | ICD-10-CM | POA: Diagnosis not present

## 2022-11-29 DIAGNOSIS — N1831 Chronic kidney disease, stage 3a: Secondary | ICD-10-CM | POA: Diagnosis not present

## 2022-11-29 DIAGNOSIS — T796XXD Traumatic ischemia of muscle, subsequent encounter: Secondary | ICD-10-CM | POA: Diagnosis not present

## 2022-11-29 DIAGNOSIS — F0393 Unspecified dementia, unspecified severity, with mood disturbance: Secondary | ICD-10-CM | POA: Diagnosis not present

## 2022-11-29 DIAGNOSIS — E1142 Type 2 diabetes mellitus with diabetic polyneuropathy: Secondary | ICD-10-CM | POA: Diagnosis not present

## 2022-11-29 DIAGNOSIS — F321 Major depressive disorder, single episode, moderate: Secondary | ICD-10-CM | POA: Diagnosis not present

## 2022-11-29 DIAGNOSIS — I129 Hypertensive chronic kidney disease with stage 1 through stage 4 chronic kidney disease, or unspecified chronic kidney disease: Secondary | ICD-10-CM | POA: Diagnosis not present

## 2022-11-29 DIAGNOSIS — E1122 Type 2 diabetes mellitus with diabetic chronic kidney disease: Secondary | ICD-10-CM | POA: Diagnosis not present

## 2022-11-29 DIAGNOSIS — N179 Acute kidney failure, unspecified: Secondary | ICD-10-CM | POA: Diagnosis not present

## 2023-01-22 ENCOUNTER — Ambulatory Visit (INDEPENDENT_AMBULATORY_CARE_PROVIDER_SITE_OTHER): Payer: Medicare HMO | Admitting: Podiatry

## 2023-01-22 DIAGNOSIS — M2012 Hallux valgus (acquired), left foot: Secondary | ICD-10-CM | POA: Diagnosis not present

## 2023-01-22 DIAGNOSIS — M2010 Hallux valgus (acquired), unspecified foot: Secondary | ICD-10-CM | POA: Diagnosis not present

## 2023-01-22 DIAGNOSIS — M79676 Pain in unspecified toe(s): Secondary | ICD-10-CM

## 2023-01-22 DIAGNOSIS — M2011 Hallux valgus (acquired), right foot: Secondary | ICD-10-CM | POA: Diagnosis not present

## 2023-01-22 DIAGNOSIS — B351 Tinea unguium: Secondary | ICD-10-CM | POA: Diagnosis not present

## 2023-01-22 DIAGNOSIS — M79609 Pain in unspecified limb: Secondary | ICD-10-CM | POA: Diagnosis not present

## 2023-01-22 DIAGNOSIS — E119 Type 2 diabetes mellitus without complications: Secondary | ICD-10-CM | POA: Diagnosis not present

## 2023-01-22 NOTE — Progress Notes (Signed)
This patient returns to my office for at risk foot care.  This patient requires this care by a professional since this patient will be at risk due to having diabetes mellitus.    This patient is unable to cut nails herself since the patient cannot reach her nails.These nails are painful walking and wearing shoes.  This patient presents for at risk foot care today.   General Appearance  Alert, conversant and in no acute stress.  Vascular  Dorsalis pedis and posterior tibial  pulses are palpable  bilaterally.  Capillary return is within normal limits  bilaterally. Temperature is within normal limits  bilaterally.  Neurologic  Senn-Weinstein monofilament wire test diminished   bilaterally. Muscle power within normal limits bilaterally.  Nails Thick disfigured discolored nails with subungual debris  from hallux to fifth toes bilaterally. No evidence of bacterial infection or drainage bilaterally.  Orthopedic  No limitations of motion  feet .  No crepitus or effusions noted.  No bony pathology or digital deformities noted.  HAV  B/L.  Skin  normotropic skin with no porokeratosis noted bilaterally.  No signs of infections or ulcers noted.     Onychomycosis  Pain in right toes  Pain in left toes  Consent was obtained for treatment procedures.   Mechanical debridement of nails 1-5  bilaterally performed with a nail nipper.  Filed with dremel without incident.    Return office visit   3 months                 Told patient to return for periodic foot care and evaluation due to potential at risk complications.   Lauri Till DPM  

## 2023-02-22 ENCOUNTER — Emergency Department (HOSPITAL_COMMUNITY)
Admission: EM | Admit: 2023-02-22 | Discharge: 2023-02-23 | Disposition: A | Discharge status: Home / Self Care | Payer: Medicare HMO | Source: Home / Self Care | Attending: Emergency Medicine | Admitting: Emergency Medicine

## 2023-02-22 ENCOUNTER — Encounter (HOSPITAL_COMMUNITY): Payer: Self-pay

## 2023-02-22 ENCOUNTER — Emergency Department (HOSPITAL_COMMUNITY): Payer: Medicare HMO

## 2023-02-22 ENCOUNTER — Other Ambulatory Visit: Payer: Self-pay

## 2023-02-22 DIAGNOSIS — S42035A Nondisplaced fracture of lateral end of left clavicle, initial encounter for closed fracture: Secondary | ICD-10-CM | POA: Diagnosis not present

## 2023-02-22 DIAGNOSIS — J9811 Atelectasis: Secondary | ICD-10-CM | POA: Diagnosis not present

## 2023-02-22 DIAGNOSIS — W07XXXA Fall from chair, initial encounter: Secondary | ICD-10-CM | POA: Insufficient documentation

## 2023-02-22 DIAGNOSIS — F0390 Unspecified dementia without behavioral disturbance: Secondary | ICD-10-CM | POA: Insufficient documentation

## 2023-02-22 DIAGNOSIS — E119 Type 2 diabetes mellitus without complications: Secondary | ICD-10-CM | POA: Diagnosis not present

## 2023-02-22 DIAGNOSIS — R300 Dysuria: Secondary | ICD-10-CM | POA: Insufficient documentation

## 2023-02-22 DIAGNOSIS — I10 Essential (primary) hypertension: Secondary | ICD-10-CM | POA: Insufficient documentation

## 2023-02-22 DIAGNOSIS — S4992XA Unspecified injury of left shoulder and upper arm, initial encounter: Secondary | ICD-10-CM | POA: Diagnosis present

## 2023-02-22 DIAGNOSIS — Z043 Encounter for examination and observation following other accident: Secondary | ICD-10-CM | POA: Diagnosis not present

## 2023-02-22 DIAGNOSIS — Z79899 Other long term (current) drug therapy: Secondary | ICD-10-CM | POA: Insufficient documentation

## 2023-02-22 DIAGNOSIS — W19XXXA Unspecified fall, initial encounter: Secondary | ICD-10-CM | POA: Diagnosis not present

## 2023-02-22 DIAGNOSIS — M19012 Primary osteoarthritis, left shoulder: Secondary | ICD-10-CM | POA: Diagnosis not present

## 2023-02-22 DIAGNOSIS — M25512 Pain in left shoulder: Secondary | ICD-10-CM | POA: Diagnosis not present

## 2023-02-22 DIAGNOSIS — N390 Urinary tract infection, site not specified: Secondary | ICD-10-CM | POA: Diagnosis not present

## 2023-02-22 DIAGNOSIS — S42032A Displaced fracture of lateral end of left clavicle, initial encounter for closed fracture: Secondary | ICD-10-CM | POA: Diagnosis not present

## 2023-02-22 LAB — COMPREHENSIVE METABOLIC PANEL
ALT: 19 U/L (ref 0–44)
AST: 43 U/L — ABNORMAL HIGH (ref 15–41)
Albumin: 3.8 g/dL (ref 3.5–5.0)
Alkaline Phosphatase: 59 U/L (ref 38–126)
Anion gap: 14 (ref 5–15)
BUN: 24 mg/dL — ABNORMAL HIGH (ref 8–23)
CO2: 23 mmol/L (ref 22–32)
Calcium: 9.7 mg/dL (ref 8.9–10.3)
Chloride: 101 mmol/L (ref 98–111)
Creatinine, Ser: 1.39 mg/dL — ABNORMAL HIGH (ref 0.44–1.00)
GFR, Estimated: 35 mL/min — ABNORMAL LOW (ref >60)
Glucose, Bld: 127 mg/dL — ABNORMAL HIGH (ref 70–99)
Potassium: 3.7 mmol/L (ref 3.5–5.1)
Sodium: 138 mmol/L (ref 135–145)
Total Bilirubin: 1.3 mg/dL — ABNORMAL HIGH (ref 0.3–1.2)
Total Protein: 8.3 g/dL — ABNORMAL HIGH (ref 6.5–8.1)

## 2023-02-22 LAB — CBC WITH DIFFERENTIAL/PLATELET
Abs Immature Granulocytes: 0.08 10*3/uL — ABNORMAL HIGH (ref 0.00–0.07)
Basophils Absolute: 0.1 10*3/uL (ref 0.0–0.1)
Basophils Relative: 0 %
Eosinophils Absolute: 0.1 10*3/uL (ref 0.0–0.5)
Eosinophils Relative: 1 %
HCT: 40.3 % (ref 36.0–46.0)
Hemoglobin: 12.7 g/dL (ref 12.0–15.0)
Immature Granulocytes: 1 %
Lymphocytes Relative: 10 %
Lymphs Abs: 1.2 10*3/uL (ref 0.7–4.0)
MCH: 29 pg (ref 26.0–34.0)
MCHC: 31.5 g/dL (ref 30.0–36.0)
MCV: 92 fL (ref 80.0–100.0)
Monocytes Absolute: 0.8 10*3/uL (ref 0.1–1.0)
Monocytes Relative: 7 %
Neutro Abs: 9.9 10*3/uL — ABNORMAL HIGH (ref 1.7–7.7)
Neutrophils Relative %: 81 %
Platelets: 156 10*3/uL (ref 150–400)
RBC: 4.38 MIL/uL (ref 3.87–5.11)
RDW: 13.5 % (ref 11.5–15.5)
WBC: 12.2 10*3/uL — ABNORMAL HIGH (ref 4.0–10.5)
nRBC: 0 % (ref 0.0–0.2)

## 2023-02-22 LAB — URINALYSIS, W/ REFLEX TO CULTURE (INFECTION SUSPECTED)
Bacteria, UA: NONE SEEN
Bilirubin Urine: NEGATIVE
Glucose, UA: NEGATIVE mg/dL
Hgb urine dipstick: NEGATIVE
Ketones, ur: 5 mg/dL — AB
Leukocytes,Ua: NEGATIVE
Nitrite: NEGATIVE
Protein, ur: NEGATIVE mg/dL
Specific Gravity, Urine: 1.016 (ref 1.005–1.030)
pH: 5 (ref 5.0–8.0)

## 2023-02-22 MED ORDER — LACTATED RINGERS IV BOLUS
500.0000 mL | Freq: Once | INTRAVENOUS | Status: AC
  Administered 2023-02-22: 500 mL via INTRAVENOUS

## 2023-02-22 NOTE — Discharge Instructions (Addendum)
You have a collarbone fracture that was seen on the x-rays from falling out of your chair.  The CAT scan of your brain and your blood work looked normal today.  Home health was ordered and physical therapy to help you at home but you will need to follow-up with the specialist to make sure the fracture is healing.  Wear the sling until you follow-up with them.  You can take it off to bathe.

## 2023-02-22 NOTE — Progress Notes (Signed)
Orthopedic Tech Progress Note Patient Details:  Kirsten Kelly [birth year] [id number]  Ortho Devices Type of Ortho Device: Shoulder immobilizer Ortho Device/Splint Location: LUE Ortho Device/Splint Interventions: Ordered, Application, Adjustment   Post Interventions Patient Tolerated: Well  Al Decant 02/22/2023, 11:24 PM

## 2023-02-22 NOTE — ED Provider Notes (Signed)
[place] EMERGENCY DEPARTMENT AT [facility] Provider Note   CSN: [id number] Arrival date & time: 02/22/23  1758     History  Chief Complaint  Patient presents with   Dysuria   Fall    Kirsten Kelly is a 89 y.o. female.  Patient is a 89 year old female who has a history of hypertension, diabetes and mild dementia but is still living at home by herself presenting today due to pain in her left arm after she fell out of her chair 2 days ago.  She reports she was sleeping in her chair and woke up on the floor.  Since that time her arms been hurting it hurts when she moves her arm.  She denies a headache, chest pain, shortness of breath, abdominal pain.  She reports she still been eating okay.  No numbness or tingling in her extremities.  She does not take any anticoagulation.  It was noted that someone had mentioned that she had dysuria however patient denies that at this time.  She denies any constipation or diarrhea and denies nausea or vomiting.  She has not had any shortness of breath.  Speaking with the patient's power of attorney Rosetta Lurena Nida she reports the patient has had several falls recently and her dementia has seemed worse but she also has not had her medicine in the last few days.  However she states she usually does pretty well at home and still think she is safe at home.  The history is provided by the patient and a friend.  Dysuria Fall       Home Medications Prior to Admission medications   Medication Sig Start Date End Date Taking? Authorizing Provider  acetaminophen (TYLENOL) 325 MG tablet Take 650 mg by mouth daily as needed for moderate pain, fever or headache.    [provider]  amLODipine (NORVASC) 10 MG tablet Take 10 mg by mouth daily. 07/05/20 12/08/22  [provider]  spironolactone (ALDACTONE) 50 MG tablet Take 50 mg by mouth daily. 07/05/20   [provider]      Allergies    Patient has no known allergies.     Review of Systems   Review of Systems  Genitourinary:  Positive for dysuria.    Physical Exam Updated Vital Signs BP (!) 164/73   Pulse 81   Temp 98.4 F (36.9 C) (Oral)   Resp (!) 24   Ht 5\' 6"  (1.676 m)   Wt 77 kg   SpO2 93%   BMI 27.40 kg/m  Physical Exam Vitals and nursing note reviewed.  Constitutional:      General: She is not in acute distress.    Appearance: Normal appearance. She is well-developed.  HENT:     Head: Normocephalic and atraumatic.  Eyes:     Pupils: Pupils are equal, round, and reactive to light.  Cardiovascular:     Rate and Rhythm: Normal rate and regular rhythm.     Pulses: Normal pulses.     Heart sounds: Normal heart sounds. No murmur heard.    No friction rub.  Pulmonary:     Effort: Pulmonary effort is normal.     Breath sounds: Normal breath sounds. No wheezing or rales.  Abdominal:     General: Bowel sounds are normal. There is no distension.     Palpations: Abdomen is soft.     Tenderness: There is no abdominal tenderness. There is no guarding or rebound.  Musculoskeletal:  General: Tenderness and signs of injury present.     Left shoulder: Swelling and tenderness present. Decreased range of motion.       Arms:     Cervical back: Normal range of motion and neck supple.     Comments: No edema  Skin:    General: Skin is warm and dry.     Findings: No rash.  Neurological:     Mental Status: She is alert and oriented to person, place, and time. Mental status is at baseline.     Cranial Nerves: No cranial nerve deficit or dysarthria.     Sensory: Sensation is intact.     Motor: Motor function is intact. No pronator drift.     Coordination: Coordination is intact.     Comments: Minimal confusion that she intermittently has to be redirected  Psychiatric:        Behavior: Behavior normal.     ED Results / Procedures / Treatments   Labs (all labs ordered are listed, but only abnormal results are displayed) Labs Reviewed   CBC WITH DIFFERENTIAL/PLATELET - Abnormal; Notable for the following components:      Result Value   WBC 12.2 (*)    Neutro Abs 9.9 (*)    Abs Immature Granulocytes 0.08 (*)    All other components within normal limits  COMPREHENSIVE METABOLIC PANEL - Abnormal; Notable for the following components:   Glucose, Bld 127 (*)    BUN 24 (*)    Creatinine, Ser 1.39 (*)    Total Protein 8.3 (*)    AST 43 (*)    Total Bilirubin 1.3 (*)    GFR, Estimated 35 (*)    All other components within normal limits  URINALYSIS, W/ REFLEX TO CULTURE (INFECTION SUSPECTED) - Abnormal; Notable for the following components:   Ketones, ur 5 (*)    All other components within normal limits    EKG EKG Interpretation Date/Time:  Friday February 22 2023 19:02:49 EDT Ventricular Rate:  76 PR Interval:    QRS Duration:  110 QT Interval:  416 QTC Calculation: 468 R Axis:   -24  Text Interpretation: Normal sinus rhythm Premature atrial complexes Borderline left axis deviation Anteroseptal infarct, age indeterminate Abnormal T, consider ischemia, diffuse leads Confirmed by Gwyneth Sprout ([id number]) on 02/22/2023 8:41:57 PM  Radiology CT Head Wo Contrast  Result Date: 02/22/2023 CLINICAL DATA:  Un witnessed fall EXAM: CT HEAD WITHOUT CONTRAST TECHNIQUE: Contiguous axial images were obtained from the base of the skull through the vertex without intravenous contrast. RADIATION DOSE REDUCTION: This exam was performed according to the departmental dose-optimization program which includes automated exposure control, adjustment of the mA and/or kV according to patient size and/or use of iterative reconstruction technique. COMPARISON:  10/11/2022 FINDINGS: Brain: Stable chronic small-vessel ischemic changes within the periventricular white matter. No acute infarct or hemorrhage. Lateral ventricles and midline structures are unremarkable. No acute extra-axial fluid collections. No mass effect. Vascular: No hyperdense vessel or  unexpected calcification. Skull: Normal. Negative for fracture or focal lesion. Sinuses/Orbits: Polypoid mucosal thickening versus mucous retention cyst left maxillary sinus. Remaining sinuses are clear. Other: None. IMPRESSION: 1. No acute intracranial process. Electronically Signed   By: Sharlet Salina M.D.   On: 02/22/2023 20:29   DG Chest Port 1 View  Result Date: 02/22/2023 CLINICAL DATA:  Status post fall. EXAM: PORTABLE CHEST 1 VIEW COMPARISON:  October 11, 2022 FINDINGS: The heart size and mediastinal contours are within normal limits. Mild atelectasis is seen within  the left lung base. No pleural effusion or pneumothorax is identified. An acute fracture deformity is seen involving the distal aspect of the left clavicle. Multilevel degenerative changes are seen throughout the thoracic spine. IMPRESSION: 1. Mild left basilar atelectasis. 2. Acute fracture of the distal left clavicle. Electronically Signed   By: Aram Candela M.D.   On: 02/22/2023 19:21   DG Shoulder Left  Result Date: 02/22/2023 CLINICAL DATA:  Status post fall. EXAM: LEFT SHOULDER - 2+ VIEW COMPARISON:  None Available. FINDINGS: There is an acute fracture deformity involving the distal aspect of the left clavicle. There is no evidence of dislocation. Moderate to marked severity degenerative changes are seen involving the left acromioclavicular joint and left glenohumeral articulation. Soft tissues are unremarkable. IMPRESSION: Acute fracture of the distal left clavicle. Electronically Signed   By: Aram Candela M.D.   On: 02/22/2023 19:20    Procedures Procedures    Medications Ordered in ED Medications  lactated ringers bolus 500 mL (0 mLs Intravenous Stopped 02/22/23 2126)    ED Course/ Medical Decision Making/ A&P                             Medical Decision Making Amount and/or Complexity of Data Reviewed Independent Historian: friend External Data Reviewed: notes. Labs: ordered. Decision-making details  documented in ED Course. Radiology: ordered and independent interpretation performed. Decision-making details documented in ED Course. ECG/medicine tests: ordered and independent interpretation performed. Decision-making details documented in ED Course.   Pt with multiple medical problems and comorbidities and presenting today with a complaint that caries a high risk for morbidity and mortality.  Patient here today due to pain in the right shoulder after falling.  Patient is awake and alert and has no other complaints at this time.  She is neurovascularly intact however after her fall with the obvious sign of injury to her shoulder will ensure no evidence of head trauma as well as she is not sure if she hit her head.  She does not take anticoagulation.  She otherwise denies any chest or abdominal symptoms.  She has had no shortness of breath or cough.  I independently interpreted patient's labs and EKG.  EKG without acute findings today.  CBC, CMP and urine all without acute findings and at her baseline.  I have independently visualized and interpreted pt's images today.  Head CT without evidence of injury today or intracranial hemorrhage.  Chest x-ray is clear and shoulder film shows a distal clavicle fracture.  Findings were discussed with the patient.  Also discussed with her friend and caregiver.  At this time they cannot pick her up tonight because she does not drive at night.  She does feel that the patient would benefit from home health services but does not feel that she is unsafe to be at home or that she needs a rehab or placement at this time.  She plans on picking her up tomorrow morning.          Final Clinical Impression(s) / ED Diagnoses Final diagnoses:  Closed nondisplaced fracture of acromial end of left clavicle, initial encounter  Fall, initial encounter    Rx / DC Orders ED Discharge Orders          Ordered    Home Health        02/22/23 2326    Face-to-face encounter  (required for Medicare/Medicaid patients)       Comments: I Claudene Gatliff  Damaso Laday certify that this patient is under my care and that I, or a nurse practitioner or physician's assistant working with me, had a face-to-face encounter that meets the physician face-to-face encounter requirements with this patient on 02/22/2023. The encounter with the patient was in whole, or in part for the following medical condition(s) which is the primary reason for home health care (List medical condition): all other questions can be routed to pt's PCP.  She had recent fall with clavicle fracture now in sling and more confusion with hx of dementia.  Lives alone   02/22/23 2326              Gwyneth Sprout, MD 02/22/23 2328

## 2023-02-22 NOTE — ED Triage Notes (Signed)
From home via ems with c/o  unwitnessed fall and foul smelling urine. Patient was seen by caregiver at 1800 on 7/25 sitting in chair and found on the floor at 7/26 at 1700. Patient had a fall on 7/23 and diagnosed with left shoulder injury. C/O of left shoulder pain, but no new injuries. Patient oriented x 2 at baseline. VSS.

## 2023-02-23 NOTE — ED Notes (Signed)
Results of patient exam, patient condition, diagnosis, discharge instructions, and followup care discussed with caregiver, Rosetta. Rosetta verbalized understanding. Patient alert, calm, without complaints, at time of departure.

## 2023-02-23 NOTE — ED Notes (Signed)
NT offered pt her breakfast and she refused.

## 2023-02-28 DIAGNOSIS — R296 Repeated falls: Secondary | ICD-10-CM | POA: Diagnosis not present

## 2023-02-28 DIAGNOSIS — F0390 Unspecified dementia without behavioral disturbance: Secondary | ICD-10-CM | POA: Diagnosis not present

## 2023-02-28 DIAGNOSIS — N1831 Chronic kidney disease, stage 3a: Secondary | ICD-10-CM | POA: Diagnosis not present

## 2023-02-28 DIAGNOSIS — E1122 Type 2 diabetes mellitus with diabetic chronic kidney disease: Secondary | ICD-10-CM | POA: Diagnosis not present

## 2023-02-28 DIAGNOSIS — I129 Hypertensive chronic kidney disease with stage 1 through stage 4 chronic kidney disease, or unspecified chronic kidney disease: Secondary | ICD-10-CM | POA: Diagnosis not present

## 2023-02-28 DIAGNOSIS — S42035D Nondisplaced fracture of lateral end of left clavicle, subsequent encounter for fracture with routine healing: Secondary | ICD-10-CM | POA: Diagnosis not present

## 2023-02-28 DIAGNOSIS — E119 Type 2 diabetes mellitus without complications: Secondary | ICD-10-CM | POA: Diagnosis not present

## 2023-04-24 ENCOUNTER — Ambulatory Visit: Payer: Medicare HMO | Admitting: Podiatry

## 2023-06-03 DIAGNOSIS — N1831 Chronic kidney disease, stage 3a: Secondary | ICD-10-CM | POA: Diagnosis not present

## 2023-07-26 ENCOUNTER — Ambulatory Visit (INDEPENDENT_AMBULATORY_CARE_PROVIDER_SITE_OTHER): Payer: Medicare Other | Admitting: Podiatry

## 2023-07-26 ENCOUNTER — Encounter: Payer: Self-pay | Admitting: Podiatry

## 2023-07-26 DIAGNOSIS — B351 Tinea unguium: Secondary | ICD-10-CM | POA: Diagnosis not present

## 2023-07-26 DIAGNOSIS — M79609 Pain in unspecified limb: Secondary | ICD-10-CM

## 2023-07-26 DIAGNOSIS — E119 Type 2 diabetes mellitus without complications: Secondary | ICD-10-CM | POA: Diagnosis not present

## 2023-07-26 NOTE — Progress Notes (Signed)
This patient returns to my office for at risk foot care.  This patient requires this care by a professional since this patient will be at risk due to having diabetes mellitus.    This patient is unable to cut nails herself since the patient cannot reach her nails.These nails are painful walking and wearing shoes.  This patient presents for at risk foot care today.   General Appearance  Alert, conversant and in no acute stress.  Vascular  Dorsalis pedis and posterior tibial  pulses are palpable  bilaterally.  Capillary return is within normal limits  bilaterally. Temperature is within normal limits  bilaterally.  Neurologic  Senn-Weinstein monofilament wire test diminished   bilaterally. Muscle power within normal limits bilaterally.  Nails Thick disfigured discolored nails with subungual debris  from hallux to fifth toes bilaterally. No evidence of bacterial infection or drainage bilaterally.  Orthopedic  No limitations of motion  feet .  No crepitus or effusions noted.  No bony pathology or digital deformities noted.  HAV  B/L.  Skin  normotropic skin with no porokeratosis noted bilaterally.  No signs of infections or ulcers noted.     Onychomycosis  Pain in right toes  Pain in left toes  Consent was obtained for treatment procedures.   Mechanical debridement of nails 1-5  bilaterally performed with a nail nipper.  Filed with dremel without incident.    Return office visit   3 months                 Told patient to return for periodic foot care and evaluation due to potential at risk complications.   Lauri Till DPM  

## 2023-10-25 ENCOUNTER — Ambulatory Visit: Payer: Medicare PPO | Admitting: Podiatry

## 2023-10-28 ENCOUNTER — Ambulatory Visit: Admitting: Podiatry

## 2023-10-28 ENCOUNTER — Encounter: Payer: Self-pay | Admitting: Podiatry

## 2023-10-28 DIAGNOSIS — E119 Type 2 diabetes mellitus without complications: Secondary | ICD-10-CM

## 2023-10-28 DIAGNOSIS — B351 Tinea unguium: Secondary | ICD-10-CM | POA: Diagnosis not present

## 2023-10-28 DIAGNOSIS — M79609 Pain in unspecified limb: Secondary | ICD-10-CM

## 2023-10-28 NOTE — Progress Notes (Signed)
This patient returns to my office for at risk foot care.  This patient requires this care by a professional since this patient will be at risk due to having diabetes mellitus.    This patient is unable to cut nails herself since the patient cannot reach her nails.These nails are painful walking and wearing shoes.  This patient presents for at risk foot care today.   General Appearance  Alert, conversant and in no acute stress.  Vascular  Dorsalis pedis and posterior tibial  pulses are palpable  bilaterally.  Capillary return is within normal limits  bilaterally. Temperature is within normal limits  bilaterally.  Neurologic  Senn-Weinstein monofilament wire test diminished   bilaterally. Muscle power within normal limits bilaterally.  Nails Thick disfigured discolored nails with subungual debris  from hallux to fifth toes bilaterally. No evidence of bacterial infection or drainage bilaterally.  Orthopedic  No limitations of motion  feet .  No crepitus or effusions noted.  No bony pathology or digital deformities noted.  HAV  B/L.  Skin  normotropic skin with no porokeratosis noted bilaterally.  No signs of infections or ulcers noted.     Onychomycosis  Pain in right toes  Pain in left toes  Consent was obtained for treatment procedures.   Mechanical debridement of nails 1-5  bilaterally performed with a nail nipper.  Filed with dremel without incident.    Return office visit   3 months                 Told patient to return for periodic foot care and evaluation due to potential at risk complications.   Lauri Till DPM  

## 2023-11-12 DIAGNOSIS — I129 Hypertensive chronic kidney disease with stage 1 through stage 4 chronic kidney disease, or unspecified chronic kidney disease: Secondary | ICD-10-CM | POA: Diagnosis not present

## 2023-11-12 DIAGNOSIS — N1831 Chronic kidney disease, stage 3a: Secondary | ICD-10-CM | POA: Diagnosis not present

## 2023-11-12 DIAGNOSIS — F0390 Unspecified dementia without behavioral disturbance: Secondary | ICD-10-CM | POA: Diagnosis not present

## 2023-11-12 DIAGNOSIS — R443 Hallucinations, unspecified: Secondary | ICD-10-CM | POA: Diagnosis not present

## 2023-12-03 DIAGNOSIS — Z23 Encounter for immunization: Secondary | ICD-10-CM | POA: Diagnosis not present

## 2023-12-03 DIAGNOSIS — M810 Age-related osteoporosis without current pathological fracture: Secondary | ICD-10-CM | POA: Diagnosis not present

## 2023-12-03 DIAGNOSIS — E441 Mild protein-calorie malnutrition: Secondary | ICD-10-CM | POA: Diagnosis not present

## 2023-12-03 DIAGNOSIS — F0390 Unspecified dementia without behavioral disturbance: Secondary | ICD-10-CM | POA: Diagnosis not present

## 2023-12-03 DIAGNOSIS — F325 Major depressive disorder, single episode, in full remission: Secondary | ICD-10-CM | POA: Diagnosis not present

## 2023-12-03 DIAGNOSIS — E1142 Type 2 diabetes mellitus with diabetic polyneuropathy: Secondary | ICD-10-CM | POA: Diagnosis not present

## 2023-12-03 DIAGNOSIS — E785 Hyperlipidemia, unspecified: Secondary | ICD-10-CM | POA: Diagnosis not present

## 2023-12-03 DIAGNOSIS — N1831 Chronic kidney disease, stage 3a: Secondary | ICD-10-CM | POA: Diagnosis not present

## 2023-12-03 DIAGNOSIS — I129 Hypertensive chronic kidney disease with stage 1 through stage 4 chronic kidney disease, or unspecified chronic kidney disease: Secondary | ICD-10-CM | POA: Diagnosis not present

## 2024-01-28 ENCOUNTER — Ambulatory Visit: Admitting: Podiatry

## 2024-03-06 ENCOUNTER — Encounter (HOSPITAL_COMMUNITY): Payer: Self-pay

## 2024-03-06 ENCOUNTER — Other Ambulatory Visit: Payer: Self-pay

## 2024-03-06 ENCOUNTER — Observation Stay (HOSPITAL_COMMUNITY)
Admission: EM | Admit: 2024-03-06 | Discharge: 2024-03-10 | Disposition: A | Discharge status: Skilled Nursing Facility | Attending: Internal Medicine | Admitting: Internal Medicine

## 2024-03-06 ENCOUNTER — Emergency Department (HOSPITAL_COMMUNITY)

## 2024-03-06 DIAGNOSIS — F39 Unspecified mood [affective] disorder: Secondary | ICD-10-CM | POA: Insufficient documentation

## 2024-03-06 DIAGNOSIS — F0390 Unspecified dementia without behavioral disturbance: Secondary | ICD-10-CM | POA: Diagnosis not present

## 2024-03-06 DIAGNOSIS — E119 Type 2 diabetes mellitus without complications: Secondary | ICD-10-CM | POA: Diagnosis not present

## 2024-03-06 DIAGNOSIS — R41841 Cognitive communication deficit: Secondary | ICD-10-CM | POA: Diagnosis not present

## 2024-03-06 DIAGNOSIS — M6281 Muscle weakness (generalized): Secondary | ICD-10-CM | POA: Insufficient documentation

## 2024-03-06 DIAGNOSIS — N179 Acute kidney failure, unspecified: Principal | ICD-10-CM | POA: Diagnosis present

## 2024-03-06 DIAGNOSIS — N183 Chronic kidney disease, stage 3 unspecified: Secondary | ICD-10-CM | POA: Diagnosis not present

## 2024-03-06 DIAGNOSIS — W19XXXA Unspecified fall, initial encounter: Secondary | ICD-10-CM | POA: Insufficient documentation

## 2024-03-06 DIAGNOSIS — G9341 Metabolic encephalopathy: Secondary | ICD-10-CM | POA: Diagnosis not present

## 2024-03-06 DIAGNOSIS — I129 Hypertensive chronic kidney disease with stage 1 through stage 4 chronic kidney disease, or unspecified chronic kidney disease: Secondary | ICD-10-CM | POA: Insufficient documentation

## 2024-03-06 DIAGNOSIS — R262 Difficulty in walking, not elsewhere classified: Secondary | ICD-10-CM | POA: Insufficient documentation

## 2024-03-06 DIAGNOSIS — Z79899 Other long term (current) drug therapy: Secondary | ICD-10-CM | POA: Diagnosis not present

## 2024-03-06 LAB — COMPREHENSIVE METABOLIC PANEL WITH GFR
ALT: 18 U/L (ref 0–44)
AST: 37 U/L (ref 15–41)
Albumin: 3.9 g/dL (ref 3.5–5.0)
Alkaline Phosphatase: 59 U/L (ref 38–126)
Anion gap: 17 — ABNORMAL HIGH (ref 5–15)
BUN: 24 mg/dL — ABNORMAL HIGH (ref 8–23)
CO2: 23 mmol/L (ref 22–32)
Calcium: 9.8 mg/dL (ref 8.9–10.3)
Chloride: 100 mmol/L (ref 98–111)
Creatinine, Ser: 1.86 mg/dL — ABNORMAL HIGH (ref 0.44–1.00)
GFR, Estimated: 25 mL/min — ABNORMAL LOW (ref >60)
Glucose, Bld: 152 mg/dL — ABNORMAL HIGH (ref 70–99)
Potassium: 4.5 mmol/L (ref 3.5–5.1)
Sodium: 140 mmol/L (ref 135–145)
Total Bilirubin: 1 mg/dL (ref 0.0–1.2)
Total Protein: 8.2 g/dL — ABNORMAL HIGH (ref 6.5–8.1)

## 2024-03-06 LAB — URINALYSIS, ROUTINE W REFLEX MICROSCOPIC
Bilirubin Urine: NEGATIVE
Glucose, UA: NEGATIVE mg/dL
Ketones, ur: NEGATIVE mg/dL
Leukocytes,Ua: NEGATIVE
Nitrite: NEGATIVE
Protein, ur: NEGATIVE mg/dL
Specific Gravity, Urine: 1.005 (ref 1.005–1.030)
pH: 8 (ref 5.0–8.0)

## 2024-03-06 LAB — CBC
HCT: 41.9 % (ref 36.0–46.0)
Hemoglobin: 13.1 g/dL (ref 12.0–15.0)
MCH: 28.4 pg (ref 26.0–34.0)
MCHC: 31.3 g/dL (ref 30.0–36.0)
MCV: 90.9 fL (ref 80.0–100.0)
Platelets: 179 K/uL (ref 150–400)
RBC: 4.61 MIL/uL (ref 3.87–5.11)
RDW: 13.3 % (ref 11.5–15.5)
WBC: 11.1 K/uL — ABNORMAL HIGH (ref 4.0–10.5)
nRBC: 0 % (ref 0.0–0.2)

## 2024-03-06 LAB — CK: Total CK: 278 U/L — ABNORMAL HIGH (ref 38–234)

## 2024-03-06 LAB — CBG MONITORING, ED: Glucose-Capillary: 160 mg/dL — ABNORMAL HIGH (ref 70–99)

## 2024-03-06 MED ORDER — SODIUM CHLORIDE 0.9 % IV SOLN: INTRAVENOUS | Status: AC

## 2024-03-06 MED ORDER — SERTRALINE HCL 25 MG PO TABS
150.0000 mg | ORAL_TABLET | Freq: Every day | ORAL | Status: DC
  Administered 2024-03-06 – 2024-03-10 (×7): 150 mg via ORAL
  Filled 2024-03-06 (×5): qty 2

## 2024-03-06 MED ORDER — AMLODIPINE BESYLATE 10 MG PO TABS
10.0000 mg | ORAL_TABLET | Freq: Every day | ORAL | Status: DC
  Administered 2024-03-06 – 2024-03-10 (×7): 10 mg via ORAL
  Filled 2024-03-06 (×5): qty 1

## 2024-03-06 MED ORDER — LACTATED RINGERS IV BOLUS
1000.0000 mL | Freq: Once | INTRAVENOUS | Status: AC
  Administered 2024-03-06: 1000 mL via INTRAVENOUS

## 2024-03-06 NOTE — Progress Notes (Signed)
 Patient admitted from the ED. Patient vitals taken and assessment done at bedside. Patient oriented to call bell system and room. She continues on IV fluids. Patient has eaten lunch and watching television in bed. Fredie LITTIE Morones, RN

## 2024-03-06 NOTE — ED Triage Notes (Signed)
 Unwitnessed fall outside her home By neighbor. Was found by EMS sitting on ground by her ramp. Pt was unable to give HX of what happened. Has home health nurse that comes by to visit. Unable to get contact to them. Pt disoriented to event. Pt reported mild neck pain during EMS ride here. No visual injuries to head.  EMS BP140/84 HR69 97% RA [id number]

## 2024-03-06 NOTE — Plan of Care (Signed)

## 2024-03-06 NOTE — Evaluation (Signed)
 Physical Therapy Evaluation Patient Details Name: Kirsten Kelly MRN: [id number] DOB: November 17, 1927 Today's Date: 03/06/2024  History of Present Illness  89 y.o. female adm 03/06/24 after fall outside her house, found by neighbor. DM, HTN, arthritis  Clinical Impression  Pt is mobile and unsteady on her feet.  Her unsteadiness combined with her dementia make her very unsafe to be at home alone. She is wearing a life alert around her neck and I asked her if she pressed it when she fell and she said, no!  I forgot that was there!SABRA  She would be an ideal candidate for post acute therapy at SNF and transition to ALF memory care unit if she is able.  PT to follow acutely for deficits listed below.         If plan is discharge home, recommend the following: A little help with walking and/or transfers;A little help with bathing/dressing/bathroom;Assistance with cooking/housework;Direct supervision/assist for medications management;Direct supervision/assist for financial management;Assist for transportation;Help with stairs or ramp for entrance;Supervision due to cognitive status   Can travel by private vehicle   Yes    Equipment Recommendations Rolling walker (2 wheels)  Recommendations for Other Services       Functional Status Assessment Patient has had a recent decline in their functional status and demonstrates the ability to make significant improvements in function in a reasonable and predictable amount of time.     Precautions / Restrictions Precautions Precautions: Fall Recall of Precautions/Restrictions: Impaired      Mobility  Bed Mobility Overal bed mobility: Modified Independent             General bed mobility comments: extra time and railing needed, but pt was able to get there without therapist's assist    Transfers Overall transfer level: Needs assistance Equipment used: 1 person hand held assist Transfers: Sit to/from Stand Sit to Stand: Min assist            General transfer comment: min assist to transition to standing and back to sitting multiple times from  different height surfaces.  Pt with uncontrolled descent to sit on lower surfaces.  Cues for safe hand placement. posterior preference throughout transition    Ambulation/Gait Ambulation/Gait assistance: Min assist, Mod assist Gait Distance (Feet): 25 Feet (25'x2) Assistive device: 1 person hand held assist Gait Pattern/deviations: Step-through pattern, Leaning posteriorly, Staggering left, Staggering right       General Gait Details: Pt with staggering gait pattern, reliance on hands for balance on furniture, posterior LOB requiring mod assist to correct/prevent fall.  Stairs            Wheelchair Mobility     Tilt Bed    Modified Rankin (Stroke Patients Only)       Balance Overall balance assessment: Mild deficits observed, not formally tested                                           Pertinent Vitals/Pain Pain Assessment Pain Assessment: No/denies pain    Home Living Family/patient expects to be discharged to:: Private residence Living Arrangements: Alone Available Help at Discharge: Personal care attendant Type of Home: Mobile home Home Access: Ramped entrance       Home Layout: One level   Additional Comments: pt is a poor historian, appears to be active with hospice and home services    Prior Function Prior Level of Function :  Independent/Modified Independent             Mobility Comments: pt is mobile, but unsteady, not sure if she used an assistive device at baseline, but found outside without one       Extremity/Trunk Assessment   Upper Extremity Assessment Upper Extremity Assessment: Generalized weakness    Lower Extremity Assessment Lower Extremity Assessment: Generalized weakness    Cervical / Trunk Assessment Cervical / Trunk Assessment: Kyphotic  Communication   Communication Communication: No apparent  difficulties    Cognition Arousal: Alert Behavior During Therapy: WFL for tasks assessed/performed   PT - Cognitive impairments: History of cognitive impairments                       PT - Cognition Comments: pt is likely close to baseline Following commands: Intact       Cueing       General Comments      Exercises     Assessment/Plan    PT Assessment Patient needs continued PT services  PT Problem List Decreased strength;Decreased activity tolerance;Decreased balance;Decreased mobility;Decreased cognition;Decreased knowledge of use of DME;Decreased safety awareness       PT Treatment Interventions DME instruction;Gait training;Functional mobility training;Therapeutic activities;Therapeutic exercise;Balance training;Neuromuscular re-education;Cognitive remediation;Patient/family education    PT Goals (Current goals can be found in the Care Plan section)  Acute Rehab PT Goals Patient Stated Goal: pt unable to state, but wanted to get up and go to the bathroom PT Goal Formulation: With patient Time For Goal Achievement: 03/20/24 Potential to Achieve Goals: Good    Frequency Min 1X/week     Co-evaluation               AM-PAC PT 6 Clicks Mobility  Outcome Measure Help needed turning from your back to your side while in a flat bed without using bedrails?: A Little Help needed moving from lying on your back to sitting on the side of a flat bed without using bedrails?: A Little Help needed moving to and from a bed to a chair (including a wheelchair)?: A Little Help needed standing up from a chair using your arms (e.g., wheelchair or bedside chair)?: A Little Help needed to walk in hospital room?: A Little Help needed climbing 3-5 steps with a railing? : A Lot 6 Click Score: 17    End of Session   Activity Tolerance: Patient tolerated treatment well Patient left: in bed;with call bell/phone within reach;with bed alarm set   PT Visit Diagnosis:  Muscle weakness (generalized) (M62.81);History of falling (Z91.81);Difficulty in walking, not elsewhere classified (R26.2)    Time: 8296-8281 PT Time Calculation (min) (ACUTE ONLY): 15 min   Charges:   PT Evaluation $PT Eval Moderate Complexity: 1 Mod   PT General Charges $$ ACUTE PT VISIT: 1 Visit         Asberry HERO, PT, DPT  Acute Rehabilitation Secure chat is best for contact #[phone] office    03/06/2024, 5:25 PM

## 2024-03-06 NOTE — ED Notes (Signed)
 A meal tray has been ordered for the patient.

## 2024-03-06 NOTE — ED Provider Notes (Signed)
 Emergency Department Provider Note  TRIAGE NOTE: Unwitnessed fall outside her home By neighbor. Was found by EMS sitting on ground by her ramp. Pt was unable to give HX of what happened. Has home health nurse that comes by to visit. Unable to get contact to them. Pt disoriented to event. Pt reported mild neck pain during EMS ride here. No visual injuries to head.  EMS BP140/84 HR69 97% RA CBG162  HISTORY  Chief Complaint Fall   HPI Kirsten Kelly is a 89 y.o. female with  a history of dementia who presents following a fall, as reported by her neighbor who called the ambulance. The patient does not recall the fall and was found sitting outside her home by EMS. She reports feeling fine but experiences pain behind her neck when touched. There is no known use of a walker or cane. She lives alone in a trailer home with a ramp and has no immediate family nearby, though she mentions having nieces and nephews. The patient has a home health aide, but contact could not be established. History was obtained from the patient and EMS.  PMH Past Medical History:  Diagnosis Date   Arthritis    Diabetes mellitus    Hypertension     Home Medications Prior to Admission medications   Medication Sig Start Date End Date Taking? Authorizing Provider  acetaminophen  (TYLENOL ) 325 MG tablet Take 650 mg by mouth daily as needed for moderate pain, fever or headache.    [provider]  amLODipine  (NORVASC ) 10 MG tablet Take 10 mg by mouth daily. 07/05/20 12/08/22  [provider]  spironolactone (ALDACTONE) 50 MG tablet Take 50 mg by mouth daily. 07/05/20   [provider]    Social History Social History   Tobacco Use   Smoking status: Never   Smokeless tobacco: Never  Vaping Use   Vaping status: Never Used  Substance Use Topics   Alcohol use: Yes    Comment: socially   Drug use: No    Review of Systems: Documented in  HPI ____________________________________________  PHYSICAL EXAM: VITAL SIGNS: Triage: Blood pressure (!) 153/67, pulse 73, temperature 97.8 F (36.6 C), temperature source Oral, resp. rate 15, SpO2 97%.  Vitals:   03/06/24 0415 03/06/24 0430 03/06/24 0600 03/06/24 0615  BP: (!) 141/66 130/67  131/64  Pulse: 68 67  65  Resp: 18 (!) 21  20  Temp:   (!) 97.4 F (36.3 C)   TempSrc:   Oral   SpO2: 100% 100%  100%    Physical Exam Vitals and nursing note reviewed.  Constitutional:      Appearance: She is well-developed.  HENT:     Head: Normocephalic and atraumatic.  Cardiovascular:     Rate and Rhythm: Normal rate and regular rhythm.  Pulmonary:     Effort: No respiratory distress.     Breath sounds: No stridor.  Abdominal:     General: There is no distension.  Musculoskeletal:     Cervical back: Normal range of motion.  Neurological:     Mental Status: She is alert.       ____________________________________________   LABS (all labs ordered are listed, but only abnormal results are displayed)  Labs Reviewed  COMPREHENSIVE METABOLIC PANEL WITH GFR - Abnormal; Notable for the following components:      Result Value   Glucose, Bld 152 (*)    BUN 24 (*)    Creatinine, Ser 1.86 (*)    Total Protein  8.2 (*)    GFR, Estimated 25 (*)    Anion gap 17 (*)    All other components within normal limits  CBC - Abnormal; Notable for the following components:   WBC 11.1 (*)    All other components within normal limits  CK - Abnormal; Notable for the following components:   Total CK 278 (*)    All other components within normal limits  CBG MONITORING, ED - Abnormal; Notable for the following components:   Glucose-Capillary 160 (*)    All other components within normal limits  URINALYSIS, ROUTINE W REFLEX MICROSCOPIC   ____________________________________________  EKG   EKG Interpretation Date/Time:    Ventricular Rate:    PR Interval:    QRS Duration:    QT  Interval:    QTC Calculation:   R Axis:      Text Interpretation:          ____________________________________________  RADIOLOGY  CT Head Wo Contrast Result Date: 03/06/2024 CLINICAL DATA:  Neck trauma (Age >= 65y); Mental status change, unknown cause. Fall EXAM: CT HEAD WITHOUT CONTRAST CT CERVICAL SPINE WITHOUT CONTRAST TECHNIQUE: Multidetector CT imaging of the head and cervical spine was performed following the standard protocol without intravenous contrast. Multiplanar CT image reconstructions of the cervical spine were also generated. RADIATION DOSE REDUCTION: This exam was performed according to the departmental dose-optimization program which includes automated exposure control, adjustment of the mA and/or kV according to patient size and/or use of iterative reconstruction technique. COMPARISON:  CT head 02/22/2023 FINDINGS: CT HEAD FINDINGS Brain: Cerebral ventricle sizes are concordant with the degree of cerebral volume loss. Patchy and confluent areas of decreased attenuation are noted throughout the deep and periventricular white matter of the cerebral hemispheres bilaterally, compatible with chronic microvascular ischemic disease. No evidence of large-territorial acute infarction. No parenchymal hemorrhage. No mass lesion. No extra-axial collection. No mass effect or midline shift. No hydrocephalus. Basilar cisterns are patent. Vascular: No hyperdense vessel. Atherosclerotic calcifications are present within the cavernous internal carotid arteries. Skull: No acute fracture or focal lesion. Sinuses/Orbits: Paranasal sinuses and mastoid air cells are clear. Bilateral lens replacement. The orbits are unremarkable. Other: None. CT CERVICAL SPINE FINDINGS Alignment: Grade 1 anterolisthesis of C3 on C4. Skull base and vertebrae: Multilevel severe degenerative changes of the spine. No acute fracture. No aggressive appearing focal osseous lesion or focal pathologic process. Soft tissues and  spinal canal: No prevertebral fluid or swelling. No visible canal hematoma. Upper chest: Biapical pleural/pulmonary scarring. Emphysematous changes. Other: None. IMPRESSION: 1. No acute intracranial abnormality. 2. No acute displaced fracture or traumatic listhesis of the cervical spine. Electronically Signed   By: Morgane  Naveau M.D.   On: 03/06/2024 03:16   CT Cervical Spine Wo Contrast Result Date: 03/06/2024 CLINICAL DATA:  Neck trauma (Age >= 65y); Mental status change, unknown cause. Fall EXAM: CT HEAD WITHOUT CONTRAST CT CERVICAL SPINE WITHOUT CONTRAST TECHNIQUE: Multidetector CT imaging of the head and cervical spine was performed following the standard protocol without intravenous contrast. Multiplanar CT image reconstructions of the cervical spine were also generated. RADIATION DOSE REDUCTION: This exam was performed according to the departmental dose-optimization program which includes automated exposure control, adjustment of the mA and/or kV according to patient size and/or use of iterative reconstruction technique. COMPARISON:  CT head 02/22/2023 FINDINGS: CT HEAD FINDINGS Brain: Cerebral ventricle sizes are concordant with the degree of cerebral volume loss. Patchy and confluent areas of decreased attenuation are noted throughout the deep and periventricular white  matter of the cerebral hemispheres bilaterally, compatible with chronic microvascular ischemic disease. No evidence of large-territorial acute infarction. No parenchymal hemorrhage. No mass lesion. No extra-axial collection. No mass effect or midline shift. No hydrocephalus. Basilar cisterns are patent. Vascular: No hyperdense vessel. Atherosclerotic calcifications are present within the cavernous internal carotid arteries. Skull: No acute fracture or focal lesion. Sinuses/Orbits: Paranasal sinuses and mastoid air cells are clear. Bilateral lens replacement. The orbits are unremarkable. Other: None. CT CERVICAL SPINE FINDINGS Alignment:  Grade 1 anterolisthesis of C3 on C4. Skull base and vertebrae: Multilevel severe degenerative changes of the spine. No acute fracture. No aggressive appearing focal osseous lesion or focal pathologic process. Soft tissues and spinal canal: No prevertebral fluid or swelling. No visible canal hematoma. Upper chest: Biapical pleural/pulmonary scarring. Emphysematous changes. Other: None. IMPRESSION: 1. No acute intracranial abnormality. 2. No acute displaced fracture or traumatic listhesis of the cervical spine. Electronically Signed   By: Morgane  Naveau M.D.   On: 03/06/2024 03:16   ____________________________________________  PROCEDURES  Procedure(s) performed:   Procedures ____________________________________________  INITIAL IMPRESSION / ASSESSMENT AND PLAN   Patient experienced a fall but does not recall the event, reports neck pain when touched, has a history of dementia, and lacks local family support. Dementia makes history taking difficult and also disposition. Will check labs, ct, ecg and attempt contact with neighbor rosetta.   ED Course  Images ordered viewed and obtained by myself. Agree with Radiology interpretation. Details in ED course.  Labs ordered reviewed by myself as detailed in ED course.  Consultations obtained/considered detailed in ED course.   Clinical Course as of 03/06/24 0652  Fri Mar 06, 2024  0154 BP(!): 153/67 [JM]  0154 Temp: 97.8 F (36.6 C) Will need to talk to neighbor to see what her baseline is but right now she is very nice but has poor memory.  [JM]  0220 Attempted to call Ms. Rosetta at [phone] in the chart with the patient's permission. Phone number disconnected. [JM]    Clinical Course User Index [JM] Charmel Pronovost, Selinda, MD      Cardiac Monitoring:  The patient was maintained on a cardiac monitor.  I personally viewed and interpreted the cardiac monitored which showed an underlying rhythm of: sinus  CRITICAL INTERVENTIONS:  IVF for  AKI  FINAL IMPRESSION Final diagnoses:  AKI (acute kidney injury) [facility])     Medical screening exam was performed and I feel the patient has had appropriate emergency department evaluation and work-up for their chief complaint and is stable for ADMISSION to the hospital at this time.  I discussed with Dr. Segars with the [facility] service and discussed labs, imaging and other work-up in the emergency room.  They agree to admission for further management and work-up of said condition. ____________________________________________   NEW OUTPATIENT MEDICATIONS STARTED DURING THIS VISIT:  New Prescriptions   No medications on file    Note:  This note was prepared with assistance of Dragon voice recognition software. Occasional wrong-word or sound-a-like substitutions may have occurred due to the inherent limitations of voice recognition software.    Corynn Solberg, Selinda, MD 03/06/24 [phone]

## 2024-03-06 NOTE — Progress Notes (Addendum)
 9:20am: CSW spoke with Xenia, Diplomatic Services operational officer at [facility] who states the only contact patient's friend Darvin Geralds at [phone].  CSW attempted to reach Rosetta again without success.  7:25am: CSW received consult for patient due to difficulty reaching patient's family for discussion of discharge planning.  CSW attempted to reach [employer] (listed as other on demographic sheet) at [phone] without success, no voicemail option available. CSW attempted to reach Rosetta at [phone] without success.  Patient's PCP is Dr. Montie Pizza at [facility]. CSW will attempt to reach office staff at 8:30am to determine if the office has any additional contact information for patient's family members.  Niels Portugal, MSW, LCSW Transitions of Care  Clinical Social Worker II [phone]

## 2024-03-06 NOTE — ED Notes (Signed)
 Patient was up walking around in room, and wasn't aware she had pulled her IV out. She did remove all monitor cords off her. Waddell and I assisted patient with gown and placed a brief on patient and changed bed sheets and pad. Patient stated she needed to go to bathroom so we took her in wheelchair, but did not realize till later she needed a urine. The Nurse is aware. Pulse ox and blood pressure cuff was placed on patient after returning back to room.

## 2024-03-06 NOTE — H&P (Signed)
 History and Physical    Patient: Kirsten Kelly FMW:[id number] DOB: 07/05/1928 DOA: 03/06/2024 DOS: the patient was seen and examined on 03/06/2024 PCP: Teresa Channel, MD  Patient coming from: Home  Chief Complaint:  Chief Complaint  Patient presents with   Fall   HPI: Kirsten Kelly is a 89 y.o. female with medical history significant of HTN and DM2 p/w AKI and confusion.  Pt is a poor and unreliable historian. From what I can gather from brief interview, pt was found outside her home by a neighbor who called the EMS for evaluation. Per the pt, she was outside her home trying to get into a window because the door was locked. Of note, she inconsistently reported living alone, but knocking loudly on the door because her mother who lives with her is hard of hearing (NOTE: pt is 22).   In the ED, pt AFVSS. Labs notable for Cr 1.86 (baseline unknown, but this appear elevated for age and weight). CTH w/ NAICA. EDP requested admission for AKI and confusion.  Review of Systems: As mentioned in the history of present illness. All other systems reviewed and are negative. Past Medical History:  Diagnosis Date   Arthritis    Diabetes mellitus    Hypertension    Past Surgical History:  Procedure Laterality Date   ABDOMINAL HYSTERECTOMY     ORIF HUMERUS FRACTURE Right 09/28/2016   Procedure: OPEN REDUCTION INTERNAL FIXATION (ORIF) PROXIMAL HUMERUS FRACTURE;  Surgeon: Oneil JAYSON Herald, MD;  Location: [facility] OR;  Service: Orthopedics;  Laterality: Right;   Social History:  reports that she has never smoked. She has never used smokeless tobacco. She reports current alcohol use. She reports that she does not use drugs.  No Known Allergies  History reviewed. No pertinent family history.  Prior to Admission medications   Medication Sig Start Date End Date Taking? Authorizing Provider  sertraline  (ZOLOFT ) 100 MG tablet Take 150 mg by mouth daily.   Yes [provider]  acetaminophen  (TYLENOL )  325 MG tablet Take 650 mg by mouth daily as needed for moderate pain, fever or headache.    [provider]  amLODipine  (NORVASC ) 10 MG tablet Take 10 mg by mouth daily. 07/05/20 12/08/22  [provider]  spironolactone (ALDACTONE) 50 MG tablet Take 50 mg by mouth daily. 07/05/20   [provider]    Physical Exam: Vitals:   03/06/24 1045 03/06/24 1245 03/06/24 1320 03/06/24 1614  BP: 122/61 (!) 130/50 107/76 128/66  Pulse: 62 61 71 64  Resp:   17   Temp:   97.9 F (36.6 C) 98.4 F (36.9 C)  TempSrc:   Oral   SpO2: 98% 99% 100% 95%  Weight:   70.4 kg   Height:   5' 6 (1.676 m)    General: Alert, oriented x3, resting comfortably in no acute distress Respiratory: Lungs clear to auscultation bilaterally with normal respiratory effort; no w/r/r Cardiovascular: Regular rate and rhythm w/o m/r/g   Data Reviewed:  Lab Results  Component Value Date   WBC 11.1 (H) 03/06/2024   HGB 13.1 03/06/2024   HCT 41.9 03/06/2024   MCV 90.9 03/06/2024   PLT 179 03/06/2024   Lab Results  Component Value Date   GLUCOSE 152 (H) 03/06/2024   CALCIUM 9.8 03/06/2024   NA 140 03/06/2024   K 4.5 03/06/2024   CO2 23 03/06/2024   CL 100 03/06/2024   BUN 24 (H) 03/06/2024   CREATININE 1.86 (H) 03/06/2024  Lab Results  Component Value Date   ALT 18 03/06/2024   AST 37 03/06/2024   ALKPHOS 59 03/06/2024   BILITOT 1.0 03/06/2024   Lab Results  Component Value Date   INR 0.98 08/07/2011    Radiology: CT Head Wo Contrast Result Date: 03/06/2024 CLINICAL DATA:  Neck trauma (Age >= 65y); Mental status change, unknown cause. Fall EXAM: CT HEAD WITHOUT CONTRAST CT CERVICAL SPINE WITHOUT CONTRAST TECHNIQUE: Multidetector CT imaging of the head and cervical spine was performed following the standard protocol without intravenous contrast. Multiplanar CT image reconstructions of the cervical spine were also generated. RADIATION DOSE REDUCTION: This exam was performed according  to the departmental dose-optimization program which includes automated exposure control, adjustment of the mA and/or kV according to patient size and/or use of iterative reconstruction technique. COMPARISON:  CT head 02/22/2023 FINDINGS: CT HEAD FINDINGS Brain: Cerebral ventricle sizes are concordant with the degree of cerebral volume loss. Patchy and confluent areas of decreased attenuation are noted throughout the deep and periventricular white matter of the cerebral hemispheres bilaterally, compatible with chronic microvascular ischemic disease. No evidence of large-territorial acute infarction. No parenchymal hemorrhage. No mass lesion. No extra-axial collection. No mass effect or midline shift. No hydrocephalus. Basilar cisterns are patent. Vascular: No hyperdense vessel. Atherosclerotic calcifications are present within the cavernous internal carotid arteries. Skull: No acute fracture or focal lesion. Sinuses/Orbits: Paranasal sinuses and mastoid air cells are clear. Bilateral lens replacement. The orbits are unremarkable. Other: None. CT CERVICAL SPINE FINDINGS Alignment: Grade 1 anterolisthesis of C3 on C4. Skull base and vertebrae: Multilevel severe degenerative changes of the spine. No acute fracture. No aggressive appearing focal osseous lesion or focal pathologic process. Soft tissues and spinal canal: No prevertebral fluid or swelling. No visible canal hematoma. Upper chest: Biapical pleural/pulmonary scarring. Emphysematous changes. Other: None. IMPRESSION: 1. No acute intracranial abnormality. 2. No acute displaced fracture or traumatic listhesis of the cervical spine. Electronically Signed   By: Morgane  Naveau M.D.   On: 03/06/2024 03:16   CT Cervical Spine Wo Contrast Result Date: 03/06/2024 CLINICAL DATA:  Neck trauma (Age >= 65y); Mental status change, unknown cause. Fall EXAM: CT HEAD WITHOUT CONTRAST CT CERVICAL SPINE WITHOUT CONTRAST TECHNIQUE: Multidetector CT imaging of the head and  cervical spine was performed following the standard protocol without intravenous contrast. Multiplanar CT image reconstructions of the cervical spine were also generated. RADIATION DOSE REDUCTION: This exam was performed according to the departmental dose-optimization program which includes automated exposure control, adjustment of the mA and/or kV according to patient size and/or use of iterative reconstruction technique. COMPARISON:  CT head 02/22/2023 FINDINGS: CT HEAD FINDINGS Brain: Cerebral ventricle sizes are concordant with the degree of cerebral volume loss. Patchy and confluent areas of decreased attenuation are noted throughout the deep and periventricular white matter of the cerebral hemispheres bilaterally, compatible with chronic microvascular ischemic disease. No evidence of large-territorial acute infarction. No parenchymal hemorrhage. No mass lesion. No extra-axial collection. No mass effect or midline shift. No hydrocephalus. Basilar cisterns are patent. Vascular: No hyperdense vessel. Atherosclerotic calcifications are present within the cavernous internal carotid arteries. Skull: No acute fracture or focal lesion. Sinuses/Orbits: Paranasal sinuses and mastoid air cells are clear. Bilateral lens replacement. The orbits are unremarkable. Other: None. CT CERVICAL SPINE FINDINGS Alignment: Grade 1 anterolisthesis of C3 on C4. Skull base and vertebrae: Multilevel severe degenerative changes of the spine. No acute fracture. No aggressive appearing focal osseous lesion or focal pathologic process. Soft tissues and spinal canal:  No prevertebral fluid or swelling. No visible canal hematoma. Upper chest: Biapical pleural/pulmonary scarring. Emphysematous changes. Other: None. IMPRESSION: 1. No acute intracranial abnormality. 2. No acute displaced fracture or traumatic listhesis of the cervical spine. Electronically Signed   By: Morgane  Naveau M.D.   On: 03/06/2024 03:16    Assessment and Plan: 59F h/o  HTN and DM2 p/w AKI and confusion.  AKI -MIVF: NS at 75cc/h for 24h -Strict I&Os and daily weights (standing preferred) -F/u BMP daily -Renally dose medications for CrCl -Avoid lovenox, NSAIDs, morphine, Fleet's phosphate enema, regular insulin , contrast; no gadolinium for MRI to avoid nephrogenic systemic fibrosis -Consider renal US  and nephrology consult if worsening AKI or lack of improvementening AKI or lack of improvement  Acute encephalopathy No obvious infectious etiology to cause delirium; possible dementia given advanced age -Defer abx for now -F/u procalcitonin  HTN -PTA amlodipine  10mg  daily  Mood disorder -PTA Zoloft    Advance Care Planning:   Code Status: Full Code   Consults: N/A  Family Communication: Rosetta, [phone]   Severity of Illness: The appropriate patient status for this patient is OBSERVATION. Observation status is judged to be reasonable and necessary in order to provide the required intensity of service to ensure the patient's safety. The patient's presenting symptoms, physical exam findings, and initial radiographic and laboratory data in the context of their medical condition is felt to place them at decreased risk for further clinical deterioration. Furthermore, it is anticipated that the patient will be medically stable for discharge from the hospital within 2 midnights of admission.    ------- I spent 56 minutes reviewing previous notes, at the bedside counseling/discussing the treatment plan, and performing clinical documentation.  Author: Marsha Ada, MD 03/06/2024 4:47 PM  For on call review [email or web address].

## 2024-03-06 NOTE — ED Notes (Signed)
 Pt had episode of incontinence, will attempt to collect urine sample when pt voids next.

## 2024-03-06 NOTE — TOC Initial Note (Signed)
 Transition of Care [facility]) - Initial/Assessment Note    Patient Details  Name: Kirsten Kelly MRN: [id number] Date of Birth: 10/26/1927  Transition of Care [facility]) CM/SW Contact:    Lauraine FORBES Saa, LCSWA Phone Number: 03/06/2024, 4:35 PM  Clinical Narrative:                  4:35 PM CSW introduced self and role to patient's former coworker and friend, Rosetta [phone]) (patient has history of moderate dementia). Rosetta confirmed patient resides at home alone and receives home hospice through [facility]. Rosetta confirmed patient has DME (shower stool, rolling walker, caner) history (per chart review, through [employer]) and stated that patient does not use DME at home. Rosetta confirmed patient has HH history with [facility] and SNF history with [facility]. Rosetta was agreeable with patient discharging to SNF STR if recommended. Rosetta confirmed contact information ([phone]; [email or web address]) and accepted CSW offer of caregiver support groups/resources. CSW [employer]. Per chart review, patient has a PCP and insurance. [facility] [address]. TOC will continue to follow and be available to assist.  Expected Discharge Plan: Skilled Nursing Facility Barriers to Discharge: Continued Medical Work up   Patient Goals and CMS Choice            Expected Discharge Plan and Services In-house Referral: Clinical Social Work   Post Acute Care Choice: Skilled Nursing Facility Living arrangements for the past 2 months: Mobile Home                   DME Agency: [employer] Agency: Other - See comment [facility])        Prior Living Arrangements/Services Living arrangements for the past 2 months: Mobile Home Lives with:: Self Patient language and need for interpreter reviewed:: Yes        Need for Family Participation in Patient Care: Yes (Comment) Care giver support system in place?: Yes  (comment) Current home services: DME, Other (comment) (Home Hospice) Criminal Activity/Legal Involvement Pertinent to Current Situation/Hospitalization: No - Comment as needed  Activities of Daily Living   ADL Screening (condition at time of admission) Independently performs ADLs?: No Does the patient have a NEW difficulty with bathing/dressing/toileting/self-feeding that is expected to last >3 days?: Yes (Initiates electronic notice to provider for possible OT consult) Does the patient have a NEW difficulty with getting in/out of bed, walking, or climbing stairs that is expected to last >3 days?: Yes (Initiates electronic notice to provider for possible PT consult) Does the patient have a NEW difficulty with communication that is expected to last >3 days?: Yes (Initiates electronic notice to provider for possible SLP consult) Is the patient deaf or have difficulty hearing?: Yes Does the patient have difficulty seeing, even when wearing glasses/contacts?: No Does the patient have difficulty concentrating, remembering, or making decisions?: Yes  Permission Sought/Granted Permission sought to share information with : Other (comment), Facility Medical sales representative Permission granted to share information with : No (Contact information on chart)  Share Information with NAME: Rosetta Rolfe  Permission granted to share info w AGENCY: [facility]  Permission granted to share info w Relationship: Other (Former Radio broadcast assistant)  Permission granted to Civil engineer, contracting Information: [phone]  Emotional Assessment       Orientation: : Oriented to Self, Oriented to Place, Oriented to  Time, Oriented to Situation Alcohol / Substance Use: Not Applicable Psych Involvement: No (comment)  Admission diagnosis:  AKI (acute kidney injury) (HCC) [N17.9] Patient Active Problem List   Diagnosis Date Noted   Syncope 10/12/2022   Fall at home, initial encounter 10/12/2022   Rhabdomyolysis  10/12/2022   AKI (acute kidney injury) (HCC) 10/12/2022   Low back pain 10/12/2022   Hyperlipidemia 10/12/2022   Constipation 10/12/2022   Depression 10/02/2016   HTN (hypertension) 10/02/2016   History of depression    Controlled type 2 diabetes mellitus without complication, without long-term current use of insulin  (HCC)    Closed fracture of right proximal humerus    Proximal humerus fracture 09/28/2016   Type 2 diabetes mellitus (HCC) 01/25/2011   PCP:  Teresa Channel, MD [phone] , [place] - [address] AT [facility] [address] [place] [place] [address] Phone: [phone] Fax: [phone]     Social Drivers of Health (SDOH) Social History: SDOH Screenings   Food Insecurity: Food Insecurity Present (03/06/2024)  Housing: Unknown (03/06/2024)  Transportation Needs: Unmet Transportation Needs (03/06/2024)  Utilities: Not At Risk (03/06/2024)  Financial Resource Strain: Low Risk  (06/18/2020)   Received from [facility]  Social Connections: Moderately Isolated (03/06/2024)  Tobacco Use: Low Risk  (03/06/2024)   SDOH Interventions:     Readmission Risk Interventions     No data to display

## 2024-03-06 NOTE — ED Notes (Signed)
 EMT walked past room 25 and pt was standing at open door, with all clothes off, all cords off, and IV pulled out of arm. Pt was assisted by two EMTs back to chair while they cleaned and managed pts room. Pts room was cleaned and pt was put in new gown, taken to the restroom via wheelchair, and placed in new brief, pt was returned to room with no issue and call bell in reach. MD and nurse made aware of pt findings and that a sitter should be considered.

## 2024-03-07 DIAGNOSIS — F39 Unspecified mood [affective] disorder: Secondary | ICD-10-CM

## 2024-03-07 DIAGNOSIS — I10 Essential (primary) hypertension: Secondary | ICD-10-CM | POA: Diagnosis not present

## 2024-03-07 DIAGNOSIS — G9340 Encephalopathy, unspecified: Secondary | ICD-10-CM

## 2024-03-07 DIAGNOSIS — N179 Acute kidney failure, unspecified: Secondary | ICD-10-CM | POA: Diagnosis not present

## 2024-03-07 LAB — GLUCOSE, CAPILLARY
Glucose-Capillary: 218 mg/dL — ABNORMAL HIGH (ref 70–99)
Glucose-Capillary: 73 mg/dL (ref 70–99)
Glucose-Capillary: 123 mg/dL — ABNORMAL HIGH (ref 70–99)

## 2024-03-07 LAB — PROCALCITONIN: Procalcitonin: <0.1 ng/mL

## 2024-03-07 MED ORDER — INSULIN ASPART 100 UNIT/ML IJ SOLN: 0.0000 [IU] | Freq: Every day | INTRAMUSCULAR | Status: DC

## 2024-03-07 MED ORDER — ORAL CARE MOUTH RINSE: 15.0000 mL | OROMUCOSAL | Status: DC | PRN

## 2024-03-07 MED ORDER — INSULIN ASPART 100 UNIT/ML IJ SOLN
0.0000 [IU] | Freq: Three times a day (TID) | INTRAMUSCULAR | Status: DC
  Administered 2024-03-07: 5 [IU] via SUBCUTANEOUS
  Administered 2024-03-08 – 2024-03-09 (×5): 2 [IU] via SUBCUTANEOUS

## 2024-03-07 MED ORDER — ACETAMINOPHEN 325 MG PO TABS
650.0000 mg | ORAL_TABLET | Freq: Four times a day (QID) | ORAL | Status: DC | PRN
  Administered 2024-03-07: 650 mg via ORAL
  Filled 2024-03-07: qty 2

## 2024-03-07 NOTE — Care Management Obs Status (Signed)
 MEDICARE OBSERVATION STATUS NOTIFICATION   Patient Details  Name: Kirsten Kelly MRN: [id number] Date of Birth: [birth year]   Medicare Observation Status Notification Given:  Yes    Jon Cruel 03/07/2024, 10:45 AM

## 2024-03-07 NOTE — Progress Notes (Signed)
 Progress Note   Patient: Kirsten Kelly FMW:[id number] DOB: 02-Nov-1927 DOA: 03/06/2024     0 DOS: the patient was seen and examined on 03/07/2024   Brief hospital course: [name] is a 89 y.o. female with medical history significant of HTN and DM2 presented for evaluation of fall, confusion. Labs showed acute kidney dysfunction admitted to TRH service for further management and evaluation.  Assessment and Plan: Acute on CKD stage 3B In the setting of poor oral intake. Baseline creatinine around 1.3- 1.4. GFR 35-45. Gentle IV hydration. Encourage oral diet, supplements. Hold chlorthalidone, aldactone. Monitor urine output, renal function. Avoid nephrotoxic drugs.   Acute encephalopathy No obvious infectious etiology to cause delirium; possible dementia given advanced age Her mental status seems baseline, she is abel to answer me. PT/ OT evaluation for safe discharge planning.  TOC for safe dc planning as she claims to be living alone.   HTN Resumed home dose amlodipine  10mg  daily. Hold hygroton, aldactone.   Mood disorder continue Zoloft     Out of bed to chair. Incentive spirometry. Nursing supportive care. Fall, aspiration precautions. Diet:  Diet Orders (From admission, onward)     Start     Ordered   03/07/24 1010  Diet heart healthy/carb modified Room service appropriate? Yes; Fluid consistency: Thin  Diet effective now       Question Answer Comment  Diet-HS Snack? Nothing   Room service appropriate? Yes   Fluid consistency: Thin      03/07/24 1010           DVT prophylaxis: SCDs Start: 03/06/24 1008  Level of care: Med-Surg   Code Status: Full Code  Subjective: Patient is seen and examined today morning. She is able to tell her name, age, place. Denies any complaints. Did not get out of bed.  Physical Exam: Vitals:   03/06/24 1908 03/07/24 0015 03/07/24 0418 03/07/24 0725  BP: 119/81 136/69 (!) 143/70 133/61  Pulse: 64 70 70 65  Resp: 16 16 16     Temp: 98.6 F (37 C) 98.1 F (36.7 C) 98.2 F (36.8 C) 98.3 F (36.8 C)  TempSrc: Oral Oral Oral   SpO2: 99% 96% 94% 95%  Weight:      Height:        General - Elderly African American female, no apparent distress HEENT - PERRLA, EOMI, atraumatic head, non tender sinuses. Lung - Clear, no rales, rhonchi, wheezes. Heart - S1, S2 heard, no murmurs, rubs, trace pedal edema. Abdomen - Soft, non tender, bowel sounds good Neuro - Alert, awake and able to follow commands, non focal exam. Skin - Warm and dry.  Data Reviewed:      Latest Ref Rng & Units 03/06/2024    3:40 AM 02/22/2023    7:56 PM 10/13/2022   12:28 AM  CBC  WBC 4.0 - 10.5 K/uL 11.1  12.2  10.0   Hemoglobin 12.0 - 15.0 g/dL 86.8  87.2  87.8   Hematocrit 36.0 - 46.0 % 41.9  40.3  37.7   Platelets 150 - 400 K/uL 179  156  169       Latest Ref Rng & Units 03/06/2024    2:24 AM 02/22/2023    7:56 PM 10/15/2022   12:58 AM  BMP  Glucose 70 - 99 mg/dL 847  872  876   BUN 8 - 23 mg/dL 24  24  27    Creatinine 0.44 - 1.00 mg/dL 8.13  8.60  8.67   Sodium 135 -  145 mmol/L 140  138  136   Potassium 3.5 - 5.1 mmol/L 4.5  3.7  4.1   Chloride 98 - 111 mmol/L 100  101  105   CO2 22 - 32 mmol/L 23  23  24    Calcium 8.9 - 10.3 mg/dL 9.8  9.7  8.8    CT Head Wo Contrast Result Date: 03/06/2024 CLINICAL DATA:  Neck trauma (Age >= 65y); Mental status change, unknown cause. Fall EXAM: CT HEAD WITHOUT CONTRAST CT CERVICAL SPINE WITHOUT CONTRAST TECHNIQUE: Multidetector CT imaging of the head and cervical spine was performed following the standard protocol without intravenous contrast. Multiplanar CT image reconstructions of the cervical spine were also generated. RADIATION DOSE REDUCTION: This exam was performed according to the departmental dose-optimization program which includes automated exposure control, adjustment of the mA and/or kV according to patient size and/or use of iterative reconstruction technique. COMPARISON:  CT head  02/22/2023 FINDINGS: CT HEAD FINDINGS Brain: Cerebral ventricle sizes are concordant with the degree of cerebral volume loss. Patchy and confluent areas of decreased attenuation are noted throughout the deep and periventricular white matter of the cerebral hemispheres bilaterally, compatible with chronic microvascular ischemic disease. No evidence of large-territorial acute infarction. No parenchymal hemorrhage. No mass lesion. No extra-axial collection. No mass effect or midline shift. No hydrocephalus. Basilar cisterns are patent. Vascular: No hyperdense vessel. Atherosclerotic calcifications are present within the cavernous internal carotid arteries. Skull: No acute fracture or focal lesion. Sinuses/Orbits: Paranasal sinuses and mastoid air cells are clear. Bilateral lens replacement. The orbits are unremarkable. Other: None. CT CERVICAL SPINE FINDINGS Alignment: Grade 1 anterolisthesis of C3 on C4. Skull base and vertebrae: Multilevel severe degenerative changes of the spine. No acute fracture. No aggressive appearing focal osseous lesion or focal pathologic process. Soft tissues and spinal canal: No prevertebral fluid or swelling. No visible canal hematoma. Upper chest: Biapical pleural/pulmonary scarring. Emphysematous changes. Other: None. IMPRESSION: 1. No acute intracranial abnormality. 2. No acute displaced fracture or traumatic listhesis of the cervical spine. Electronically Signed   By: Morgane  Naveau M.D.   On: 03/06/2024 03:16   CT Cervical Spine Wo Contrast Result Date: 03/06/2024 CLINICAL DATA:  Neck trauma (Age >= 65y); Mental status change, unknown cause. Fall EXAM: CT HEAD WITHOUT CONTRAST CT CERVICAL SPINE WITHOUT CONTRAST TECHNIQUE: Multidetector CT imaging of the head and cervical spine was performed following the standard protocol without intravenous contrast. Multiplanar CT image reconstructions of the cervical spine were also generated. RADIATION DOSE REDUCTION: This exam was performed  according to the departmental dose-optimization program which includes automated exposure control, adjustment of the mA and/or kV according to patient size and/or use of iterative reconstruction technique. COMPARISON:  CT head 02/22/2023 FINDINGS: CT HEAD FINDINGS Brain: Cerebral ventricle sizes are concordant with the degree of cerebral volume loss. Patchy and confluent areas of decreased attenuation are noted throughout the deep and periventricular white matter of the cerebral hemispheres bilaterally, compatible with chronic microvascular ischemic disease. No evidence of large-territorial acute infarction. No parenchymal hemorrhage. No mass lesion. No extra-axial collection. No mass effect or midline shift. No hydrocephalus. Basilar cisterns are patent. Vascular: No hyperdense vessel. Atherosclerotic calcifications are present within the cavernous internal carotid arteries. Skull: No acute fracture or focal lesion. Sinuses/Orbits: Paranasal sinuses and mastoid air cells are clear. Bilateral lens replacement. The orbits are unremarkable. Other: None. CT CERVICAL SPINE FINDINGS Alignment: Grade 1 anterolisthesis of C3 on C4. Skull base and vertebrae: Multilevel severe degenerative changes of the spine. No acute fracture.  No aggressive appearing focal osseous lesion or focal pathologic process. Soft tissues and spinal canal: No prevertebral fluid or swelling. No visible canal hematoma. Upper chest: Biapical pleural/pulmonary scarring. Emphysematous changes. Other: None. IMPRESSION: 1. No acute intracranial abnormality. 2. No acute displaced fracture or traumatic listhesis of the cervical spine. Electronically Signed   By: Morgane  Naveau M.D.   On: 03/06/2024 03:16    Family Communication: Discussed with patient, she understand and agree. All questions answered.  Disposition: Status is: Observation The patient remains OBS appropriate and will d/c before 2 midnights.  Planned Discharge Destination:  Rehab     Time spent: 41 minutes  Author: Concepcion Riser, MD 03/07/2024 1:48 PM Secure chat 7am to 7pm For on call review [email or web address].

## 2024-03-07 NOTE — Progress Notes (Signed)
 8/9 The patient who is 89 years of age signed using her initials E.G.T.

## 2024-03-07 NOTE — Plan of Care (Signed)
   Problem: Education: Goal: Knowledge of General Education information will improve Description Including pain rating scale, medication(s)/side effects and non-pharmacologic comfort measures Outcome: Progressing

## 2024-03-07 NOTE — Plan of Care (Signed)

## 2024-03-07 NOTE — NC FL2 (Signed)
  [place]  MEDICAID FL2 LEVEL OF CARE FORM     IDENTIFICATION  Patient Name: Kirsten Kelly Birthdate: 1928/01/21 Sex: female Admission Date (Current Location): 03/06/2024  [facility] and [place] Number:  Producer, television/film/video and Address:  [facility] [place], [address], [place], [place] [address]      Provider Number: [id number]  Attending Physician Name and Address:  Darci Pore, MD  Relative Name and Phone Number:  Rolfe Laundry (Other)  [phone]    Current Level of Care: Hospital Recommended Level of Care: Skilled Nursing Facility Prior Approval Number:    Date Approved/Denied:   PASRR Number: [id number]  Discharge Plan: SNF    Current Diagnoses: Patient Active Problem List   Diagnosis Date Noted   Syncope 10/12/2022   Fall at home, initial encounter 10/12/2022   Rhabdomyolysis 10/12/2022   AKI (acute kidney injury) (HCC) 10/12/2022   Low back pain 10/12/2022   Hyperlipidemia 10/12/2022   Constipation 10/12/2022   Depression 10/02/2016   HTN (hypertension) 10/02/2016   History of depression    Controlled type 2 diabetes mellitus without complication, without long-term current use of insulin  (HCC)    Closed fracture of right proximal humerus    Proximal humerus fracture 09/28/2016   Type 2 diabetes mellitus (HCC) 01/25/2011    Orientation RESPIRATION BLADDER Height & Weight     Self, Time, Situation, Place  Normal Continent Weight: 155 lb 3.3 oz (70.4 kg) Height:  5' 6 (167.6 cm)  BEHAVIORAL SYMPTOMS/MOOD NEUROLOGICAL BOWEL NUTRITION STATUS      Continent Diet (See discharge summary)  AMBULATORY STATUS COMMUNICATION OF NEEDS Skin   Extensive Assist Verbally Normal                       Personal Care Assistance Level of Assistance  Bathing, Feeding, Dressing Bathing Assistance:  (See discharge summary) Feeding assistance:  (See discharge summary) Dressing Assistance:  (See discharge summary)     Functional  Limitations Info  Sight, Hearing, Speech Sight Info: Adequate Hearing Info: Impaired Speech Info: Adequate    SPECIAL CARE FACTORS FREQUENCY  PT (By licensed PT), OT (By licensed OT)     PT Frequency: 5x week OT Frequency: 5x week            Contractures Contractures Info: Not present    Additional Factors Info  Code Status, Allergies, Psychotropic Code Status Info: Full Allergies Info: NKA Psychotropic Info: Zoloft          Current Medications (03/07/2024):  This is the current hospital active medication list Current Facility-Administered Medications  Medication Dose Route Frequency Provider Last Rate Last Admin   0.9 %  sodium chloride  infusion   Intravenous Continuous Georgina Basket, MD 75 mL/hr at 03/07/24 0028 New Bag at 03/07/24 0028   amLODipine  (NORVASC ) tablet 10 mg  10 mg Oral Daily Georgina Basket, MD   10 mg at 03/06/24 1817   Oral care mouth rinse  15 mL Mouth Rinse PRN Sreeram, Narendranath, MD       sertraline  (ZOLOFT ) tablet 150 mg  150 mg Oral Daily Moore, Willie, MD   150 mg at 03/06/24 1817     Discharge Medications: Please see discharge summary for a list of discharge medications.  Relevant Imaging Results:  Relevant Lab Results:   Additional Information SS#: [id number]  Kewon Statler C Fontaine Kossman, LCSWA

## 2024-03-08 DIAGNOSIS — N179 Acute kidney failure, unspecified: Secondary | ICD-10-CM

## 2024-03-08 LAB — GLUCOSE, CAPILLARY
Glucose-Capillary: 112 mg/dL — ABNORMAL HIGH (ref 70–99)
Glucose-Capillary: 148 mg/dL — ABNORMAL HIGH (ref 70–99)
Glucose-Capillary: 113 mg/dL — ABNORMAL HIGH (ref 70–99)
Glucose-Capillary: 186 mg/dL — ABNORMAL HIGH (ref 70–99)

## 2024-03-08 LAB — BASIC METABOLIC PANEL WITH GFR
Anion gap: 11 (ref 5–15)
BUN: 24 mg/dL — ABNORMAL HIGH (ref 8–23)
CO2: 23 mmol/L (ref 22–32)
Calcium: 9.5 mg/dL (ref 8.9–10.3)
Chloride: 106 mmol/L (ref 98–111)
Creatinine, Ser: 1.63 mg/dL — ABNORMAL HIGH (ref 0.44–1.00)
GFR, Estimated: 29 mL/min — ABNORMAL LOW (ref >60)
Glucose, Bld: 110 mg/dL — ABNORMAL HIGH (ref 70–99)
Potassium: 4.1 mmol/L (ref 3.5–5.1)
Sodium: 140 mmol/L (ref 135–145)

## 2024-03-08 LAB — CBC
HCT: 40.8 % (ref 36.0–46.0)
Hemoglobin: 13.1 g/dL (ref 12.0–15.0)
MCH: 28.4 pg (ref 26.0–34.0)
MCHC: 32.1 g/dL (ref 30.0–36.0)
MCV: 88.3 fL (ref 80.0–100.0)
Platelets: 175 K/uL (ref 150–400)
RBC: 4.62 MIL/uL (ref 3.87–5.11)
RDW: 13.7 % (ref 11.5–15.5)
WBC: 9.7 K/uL (ref 4.0–10.5)
nRBC: 0 % (ref 0.0–0.2)

## 2024-03-08 MED ORDER — DONEPEZIL HCL 10 MG PO TABS
5.0000 mg | ORAL_TABLET | Freq: Every day | ORAL | Status: DC
  Administered 2024-03-09 (×4): 5 mg via ORAL
  Filled 2024-03-08 (×2): qty 1

## 2024-03-08 MED ORDER — SODIUM CHLORIDE 0.9 % IV SOLN: INTRAVENOUS | Status: AC

## 2024-03-08 NOTE — Progress Notes (Signed)
 Progress Note   Patient: Kirsten Kelly FMW:[id number] DOB: [birth year] DOA: 03/06/2024     0 DOS: the patient was seen and examined on 03/08/2024   Brief hospital course: Patient is a 89 year old female with past medical history significant for hypertension, type 2 diabetes mellitus, likely dementia and chronic kidney disease stage IIIb.  Patient was admitted with acute metabolic encephalopathy, fall and acute kidney injury on chronic kidney disease stage IIIb.  Renal function is improving.  Patient remains encephalopathic but improving.  03/08/2024: Patient seen.  Confusion remains, but improving.  Blood sugar has ranged from 112-148.  BMP done earlier today revealed BUN of 24, serum creatinine of 1.63, sodium of 140 with potassium of 4.1.  CBC revealed WBC of 9.7, hemoglobin of 13.1 and hematocrit of 40.8 and platelet count of 175.  Assessment and Plan: Acute on CKD stage 3B -AKI is likely related to volume depletion. - Patient has baseline chronic kidney disease stage IIIb. - History of diabetes mellitus and hypertension noted. - Gentle hydration. - Monitor renal function and electrolytes. - Avoid nephrotoxins. - Keep MAP greater than 65 mmHg.     Acute metabolic encephalopathy: -Likely baseline dementing illness. - Encephalopathy slowly improving improving. - Continue hydration. - PT OT is recommending SNF    HTN -Blood pressure is controlled.   Mood disorder continue Zoloft     Out of bed to chair. Incentive spirometry. Nursing supportive care. Fall, aspiration precautions. Diet:  Diet Orders (From admission, onward)     Start     Ordered   03/07/24 1010  Diet heart healthy/carb modified Room service appropriate? Yes; Fluid consistency: Thin  Diet effective now       Question Answer Comment  Diet-HS Snack? Nothing   Room service appropriate? Yes   Fluid consistency: Thin      03/07/24 1010           DVT prophylaxis: SCDs Start: 03/06/24 1008  Level of care:  Med-Surg   Code Status: Full Code  Subjective:  -Patient seen. - Patient remains confused.  Physical Exam: Vitals:   03/07/24 1528 03/07/24 1930 03/08/24 0447 03/08/24 0838  BP: (!) 112/56 112/61 127/63 131/72  Pulse: (!) 55 68 63 63  Resp:  16 16 18   Temp: 97.7 F (36.5 C) 98.2 F (36.8 C) 99.4 F (37.4 C) 98.1 F (36.7 C)  TempSrc: Oral Oral Oral Oral  SpO2: 96% 98% 97% 97%  Weight:      Height:        General -awake and alert.  Not in any distress.  Patient remains confused.   HEENT -mild pallor.  No jaundice.  Dry buccal mucosa.. Lung - Clear to auscultation. Heart - S1, S2 heard, increased intensity of S2 component of the heart sound.. Abdomen - Soft is soft and nontender. Neuro -awake and alert.  Data Reviewed:      Latest Ref Rng & Units 03/08/2024    4:54 AM 03/06/2024    3:40 AM 02/22/2023    7:56 PM  CBC  WBC 4.0 - 10.5 K/uL 9.7  11.1  12.2   Hemoglobin 12.0 - 15.0 g/dL 86.8  86.8  87.2   Hematocrit 36.0 - 46.0 % 40.8  41.9  40.3   Platelets 150 - 400 K/uL 175  179  156       Latest Ref Rng & Units 03/08/2024    4:54 AM 03/06/2024    2:24 AM 02/22/2023    7:56 PM  BMP  Glucose 70 -  99 mg/dL 889  847  872   BUN 8 - 23 mg/dL 24  24  24    Creatinine 0.44 - 1.00 mg/dL 8.36  8.13  8.60   Sodium 135 - 145 mmol/L 140  140  138   Potassium 3.5 - 5.1 mmol/L 4.1  4.5  3.7   Chloride 98 - 111 mmol/L 106  100  101   CO2 22 - 32 mmol/L 23  23  23    Calcium 8.9 - 10.3 mg/dL 9.5  9.8  9.7    No results found.   Family Communication: Discussed with patient, she understand and agree. All questions answered.  Disposition: Status is: Observation The patient remains OBS appropriate and will d/c before 2 midnights.  Planned Discharge Destination: Rehab     Time spent: 55 minutes  Author: Leatrice LILLETTE Chapel, MD 03/08/2024 12:53 PM Secure chat 7am to 7pm For on call review [email or web address].

## 2024-03-08 NOTE — Evaluation (Signed)
 Occupational Therapy Evaluation Patient Details Name: Kirsten Kelly MRN: [id number] DOB: 11-19-1927 Today's Date: 03/08/2024   History of Present Illness   89 y.o. female adm 03/06/24 after fall outside her house, found by neighbor.  PMH includes: DM, HTN, arthritis.     Clinical Impressions PTA patient reports independent with ADLs, light IADLs, and mobility.  She reports having a caregiver who assist with cleaning and bringing groceries, but is otherwise independent.  She is oriented today (self corrects year, and do not specifically ask month), follows commands with increased time (due to [facility]) but she appears to have decreased awareness of safety and deficits.  She currently requires min guard assist for transfers and mobility in room, up to min guard assist for ADLs.  Would recommend 24/7 supervision if she were to dc home.  Based on performance today, believe patient will best benefit from continued OT services acutely and after dc at inpatient setting with <3hrs/day to optimize independence, safety with ADLs and mobility.     If plan is discharge home, recommend the following:   A little help with walking and/or transfers;A little help with bathing/dressing/bathroom;Assistance with cooking/housework;Direct supervision/assist for medications management;Direct supervision/assist for financial management;Assist for transportation;Help with stairs or ramp for entrance;Supervision due to cognitive status     Functional Status Assessment   Patient has had a recent decline in their functional status and demonstrates the ability to make significant improvements in function in a reasonable and predictable amount of time.     Equipment Recommendations   None recommended by OT     Recommendations for Other Services         Precautions/Restrictions   Precautions Precautions: Fall Recall of Precautions/Restrictions: Impaired Restrictions Weight Bearing Restrictions Per Provider  Order: No     Mobility Bed Mobility               General bed mobility comments: OOB upon entry    Transfers Overall transfer level: Needs assistance Equipment used: None Transfers: Sit to/from Stand Sit to Stand: Contact guard assist           General transfer comment: increased effort to power up but only needing steadying assist.      Balance Overall balance assessment: Needs assistance Sitting-balance support: No upper extremity supported, Feet supported Sitting balance-Leahy Scale: Good     Standing balance support: No upper extremity supported, During functional activity, Single extremity supported Standing balance-Leahy Scale: Fair Standing balance comment: reaching out for UE support, preference to 1 hand held support if unable to furniture walk                           ADL either performed or assessed with clinical judgement   ADL Overall ADL's : Needs assistance/impaired     Grooming: Contact guard assist;Standing           Upper Body Dressing : Set up;Sitting   Lower Body Dressing: Contact guard assist;Sit to/from stand   Toilet Transfer: Contact guard assist;Ambulation           Functional mobility during ADLs: Contact guard assist       Vision   Vision Assessment?: No apparent visual deficits     Perception         Praxis         Pertinent Vitals/Pain Pain Assessment Pain Assessment: No/denies pain     Extremity/Trunk Assessment Upper Extremity Assessment Upper Extremity Assessment: Generalized weakness   Lower  Extremity Assessment Lower Extremity Assessment: Defer to PT evaluation   Cervical / Trunk Assessment Cervical / Trunk Assessment: Kyphotic   Communication Communication Communication: No apparent difficulties   Cognition Arousal: Alert Behavior During Therapy: WFL for tasks assessed/performed Cognition: History of cognitive impairments             OT - Cognition Comments: oriented to  self, year (self corrects), and place.  follows commands with increased time but poor awareness of safety and deficits.                 Following commands: Intact       Cueing  General Comments   Cueing Techniques: Verbal cues      Exercises     Shoulder Instructions      Home Living Family/patient expects to be discharged to:: Private residence Living Arrangements: Alone Available Help at Discharge: Personal care attendant Type of Home: Mobile home Home Access: Ramped entrance     Home Layout: One level     Bathroom Shower/Tub: Chief Strategy Officer: Standard     Home Equipment: Cane - single point;Shower seat   Additional Comments: pt is a poor historian, appears to be active with hospice and home services  Lives With: Alone    Prior Functioning/Environment Prior Level of Function : Independent/Modified Independent             Mobility Comments: reports independent, no AD ADLs Comments: pt reports ind ADLs and light IADLs (microwave), has a caregiver who brings her groceries    OT Problem List: Decreased strength;Decreased activity tolerance;Impaired balance (sitting and/or standing);Decreased safety awareness;Decreased cognition;Decreased knowledge of use of DME or AE;Decreased knowledge of precautions   OT Treatment/Interventions: Self-care/ADL training;Therapeutic exercise;DME and/or AE instruction;Therapeutic activities;Patient/family education;Balance training      OT Goals(Current goals can be found in the care plan section)   Acute Rehab OT Goals Patient Stated Goal: get better OT Goal Formulation: With patient Time For Goal Achievement: 03/22/24 Potential to Achieve Goals: Good   OT Frequency:  Min 2X/week    Co-evaluation              AM-PAC OT 6 Clicks Daily Activity     Outcome Measure Help from another person eating meals?: None Help from another person taking care of personal grooming?: A Little Help  from another person toileting, which includes using toliet, bedpan, or urinal?: A Little Help from another person bathing (including washing, rinsing, drying)?: A Little Help from another person to put on and taking off regular upper body clothing?: A Little Help from another person to put on and taking off regular lower body clothing?: A Little 6 Click Score: 19   End of Session Nurse Communication: Mobility status;Precautions  Activity Tolerance: Patient tolerated treatment well Patient left: in chair;with call bell/phone within reach;Other (comment) (legs elevated on recliner/tray in front of her and notified nursing she needs a chair alarm (was up in arm chair without one upon entry).)  OT Visit Diagnosis: Other abnormalities of gait and mobility (R26.89);Muscle weakness (generalized) (M62.81);History of falling (Z91.81)                Time: 8894-8883 OT Time Calculation (min): 11 min Charges:  OT General Charges $OT Visit: 1 Visit OT Evaluation $OT Eval Low Complexity: 1 Low  Kirsten Kelly, OT Acute Rehabilitation Services Office [phone] Secure Chat Preferred    Kirsten Kelly Hope 03/08/2024, 1:48 PM

## 2024-03-08 NOTE — Evaluation (Signed)
 Speech Language Pathology Evaluation Patient Details Name: Kirsten Kelly MRN: [id number] DOB: [birth year] Today's Date: 03/08/2024 Time: 9044-8985 SLP Time Calculation (min) (ACUTE ONLY): 19 min  Problem List:  Patient Active Problem List   Diagnosis Date Noted   Syncope 10/12/2022   Fall at home, initial encounter 10/12/2022   Rhabdomyolysis 10/12/2022   AKI (acute kidney injury) (HCC) 10/12/2022   Low back pain 10/12/2022   Hyperlipidemia 10/12/2022   Constipation 10/12/2022   Depression 10/02/2016   HTN (hypertension) 10/02/2016   History of depression    Controlled type 2 diabetes mellitus without complication, without long-term current use of insulin  (HCC)    Closed fracture of right proximal humerus    Proximal humerus fracture 09/28/2016   Type 2 diabetes mellitus (HCC) 01/25/2011   Past Medical History:  Past Medical History:  Diagnosis Date   Arthritis    Diabetes mellitus    Hypertension    Past Surgical History:  Past Surgical History:  Procedure Laterality Date   ABDOMINAL HYSTERECTOMY     ORIF HUMERUS FRACTURE Right 09/28/2016   Procedure: OPEN REDUCTION INTERNAL FIXATION (ORIF) PROXIMAL HUMERUS FRACTURE;  Surgeon: Oneil JAYSON Herald, MD;  Location: [facility] OR;  Service: Orthopedics;  Laterality: Right;   HPI:  89 y.o. female adm 03/06/24 after fall outside her house, found by neighbor. Pt reports she was locked out by her parents and has no recall of the events. DM, HTN, arthritis   Assessment / Plan / Recommendation Clinical Impression   Pt seen for skilled ST services for cognitive linguistic evaluation. The pt presents with a moderate cognitive impairment, suspected secondary to age related cognitive decline, and requires significantly increased care upon d/c. However, unable to determine if this is the pt's cog baseline. The pt was given the SLUMS, where she scored a 6/30 which is well below normal cognitive limits and classified as possible dementia range. She reports  handling her own medicine, finances but receives some help with housekeeping from her friends, the pt did not independently offer that she also has a caregiver, The pt was unable to recall the events of her fall but reports that people have told her that's why she's here. She was disoriented to the month and state and self corrected the year from 36 to 2025. She had poor immediate verbal recall and absent delayed verbal recall. Explicit difficulty with executive function skills, specifically numeric calculation and clock formation. She was unable to list any animals and appeared to have explicit difficulty with comprehending the task at hand. When asked functional safety/problem solving questions she had some adequate responses but appeared woefully unaware of her current deficits and potential risk for living alone. The pt is currently cognitively not able to safely take care of her needs at home and would benefit from either assisted living/memory care and or 24 hour HH caregiver assistance- pt to continue with executive function tx in ST to determine if the pt has made any progress since eval for d/c placement.     SLP Assessment  SLP Recommendation/Assessment: Patient needs continued Speech Language Pathology Services SLP Visit Diagnosis: Cognitive communication deficit (R41.841);Attention and concentration deficit     Assistance Recommended at Discharge  Frequent or constant Supervision/Assistance  Functional Status Assessment Patient has had a recent decline in their functional status and demonstrates the ability to make significant improvements in function in a reasonable and predictable amount of time.  Frequency and Duration min 2x/week  1 week      SLP  Evaluation Cognition  Overall Cognitive Status: No family/caregiver present to determine baseline cognitive functioning (Presumed cog deficits at baseline) Arousal/Alertness: Awake/alert Orientation Level: Oriented to person;Oriented to  situation;Disoriented to place;Disoriented to time Year: Other (Comment) (Pt originally said 58 then immediately self corrected to 2025) Month:  (Unable to determine, Oh man, I don't know) Day of Week: Correct Attention: Focused Focused Attention: Impaired Focused Attention Impairment: Functional basic;Functional complex Memory: Impaired Memory Impairment: Storage deficit;Retrieval deficit;Decreased short term memory Decreased Short Term Memory: Verbal basic;Functional basic;Functional complex;Verbal complex Awareness: Impaired Awareness Impairment: Intellectual impairment;Anticipatory impairment Problem Solving: Impaired Problem Solving Impairment: Functional complex Executive Function: Self Correcting;Organizing;Decision Making Organizing: Impaired Organizing Impairment: Functional basic Decision Making: Impaired Decision Making Impairment: Functional basic;Functional complex Self Correcting: Impaired Self Correcting Impairment: Functional complex Safety/Judgment: Impaired       Comprehension  Auditory Comprehension Overall Auditory Comprehension: Impaired Yes/No Questions: Within Functional Limits Commands: Impaired Multistep Basic Commands: 50-74% accurate Conversation: Simple Interfering Components: Attention;Processing speed;Working memory EffectiveTechniques: Increased volume;Pausing;Extra processing time Visual Recognition/Discrimination Discrimination: Exceptions to [facility] Black/White Line Drawings: Unable to identify Reading Comprehension Reading Status: Not tested    Expression Expression Primary Mode of Expression: Verbal Verbal Expression Overall Verbal Expression: Appears within functional limits for tasks assessed Black/White Line Drawings: Unable to identify   Oral / Motor  Motor Speech Overall Motor Speech: Appears within functional limits for tasks assessed            Manuelita Blew M.S. CCC-SLP

## 2024-03-08 NOTE — Plan of Care (Signed)
   Problem: Activity: Goal: Risk for activity intolerance will decrease Outcome: Not Progressing

## 2024-03-09 DIAGNOSIS — N179 Acute kidney failure, unspecified: Secondary | ICD-10-CM | POA: Diagnosis not present

## 2024-03-09 LAB — CBC
HCT: 39 % (ref 36.0–46.0)
Hemoglobin: 12.6 g/dL (ref 12.0–15.0)
MCH: 28.8 pg (ref 26.0–34.0)
MCHC: 32.3 g/dL (ref 30.0–36.0)
MCV: 89 fL (ref 80.0–100.0)
Platelets: 176 K/uL (ref 150–400)
RBC: 4.38 MIL/uL (ref 3.87–5.11)
RDW: 13.6 % (ref 11.5–15.5)
WBC: 9.3 K/uL (ref 4.0–10.5)
nRBC: 0 % (ref 0.0–0.2)

## 2024-03-09 LAB — GLUCOSE, CAPILLARY
Glucose-Capillary: 142 mg/dL — ABNORMAL HIGH (ref 70–99)
Glucose-Capillary: 218 mg/dL — ABNORMAL HIGH (ref 70–99)
Glucose-Capillary: 111 mg/dL — ABNORMAL HIGH (ref 70–99)
Glucose-Capillary: 134 mg/dL — ABNORMAL HIGH (ref 70–99)
Glucose-Capillary: 120 mg/dL — ABNORMAL HIGH (ref 70–99)

## 2024-03-09 LAB — BASIC METABOLIC PANEL WITH GFR
Anion gap: 12 (ref 5–15)
BUN: 32 mg/dL — ABNORMAL HIGH (ref 8–23)
CO2: 21 mmol/L — ABNORMAL LOW (ref 22–32)
Calcium: 9.1 mg/dL (ref 8.9–10.3)
Chloride: 105 mmol/L (ref 98–111)
Creatinine, Ser: 1.78 mg/dL — ABNORMAL HIGH (ref 0.44–1.00)
GFR, Estimated: 26 mL/min — ABNORMAL LOW (ref >60)
Glucose, Bld: 130 mg/dL — ABNORMAL HIGH (ref 70–99)
Potassium: 4.8 mmol/L (ref 3.5–5.1)
Sodium: 138 mmol/L (ref 135–145)

## 2024-03-09 LAB — HEMOGLOBIN A1C
Hgb A1c MFr Bld: 6.3 % — ABNORMAL HIGH (ref 4.8–5.6)
Mean Plasma Glucose: 134 mg/dL

## 2024-03-09 NOTE — Progress Notes (Signed)
 Mobility Specialist Progress Note:    03/09/24 1424  Mobility  Activity Ambulated with assistance (In hallway)  Level of Assistance Contact guard assist, steadying assist  Assistive Device Front wheel walker  Distance Ambulated (ft) 210 ft  Activity Response Tolerated well  Mobility Referral Yes  Mobility visit 1 Mobility  Mobility Specialist Start Time (ACUTE ONLY) 1340  Mobility Specialist Stop Time (ACUTE ONLY) 1350  Mobility Specialist Time Calculation (min) (ACUTE ONLY) 10 min   Received pt EOB eating lunch and agreeable to mobility. No physical assistance needed. No c/o. Returned to room without fault. Left EOB to finish lunch. Bed alarm on. Personal belongings and call light within reach. All needs met.  Lavanda Pollack Mobility Specialist  Please contact via [employer] or  Rehab Office [phone]

## 2024-03-09 NOTE — Progress Notes (Signed)
  Progress Note   Patient: Kirsten Kelly FMW:[id number] DOB: 07-23-1928 DOA: 03/06/2024     0 DOS: the patient was seen and examined on 03/09/2024   Brief hospital course: 89 year old woman with PMH of HTN, T2DM, cognitive impairment, CKD stage III who who presented with a fall and acute metabolic encephalopathy, AKI on CKD.  Assessment and Plan:  Acute metabolic encephalopathy With baseline cognitive impairment. UA was not suggestive of infection. Encephalopathy is improving. - SNF recommended.  Cognitive impairment - Continue home donepezil . - Seen by PT.  SNF recommended.  AKI on CKD Likely related to volume depletion. Patient presented with creatinine of 1.86 from baseline of approximately 1.4. Creatinine has trended down slightly. - Encourage p.o. fluids. - Home chlorthalidone and Aldactone discontinued.  Hypertension - Continue home Norvasc .  Mood disorder - Continue home Zoloft .      Subjective: Patient states she is feeling well.  She is alert and oriented to place and person.  She says she does not know why she needs to go to a skilled nursing facility.  She does live alone.  She admits to difficulties with her memory.  Physical Exam: Vitals:   03/08/24 1618 03/08/24 1922 03/09/24 0434 03/09/24 0822  BP: 116/63 122/62 (!) 122/59 117/67  Pulse: 64 70 71 69  Resp: 18 16 16 19   Temp: 98.4 F (36.9 C) 98.2 F (36.8 C) 98.2 F (36.8 C) 98.9 F (37.2 C)  TempSrc: Oral Oral Oral   SpO2: 99% 96% 96% 97%  Weight:      Height:       Physical Exam   General: Alert, oriented X3  Eyes: Pupils equal, reactive  Oral cavity: moist mucous membranes  Head: Atraumatic, normocephalic  Neck: supple  Chest: clear to auscultation. No crackles, no wheezes  CVS: S1,S2 RRR. No murmurs  Abd: No distention, soft, non-tender. No masses palpable  Extr: No edema   MSK: No joint deformities or swelling  Neurological: Grossly intact.     Data Reviewed:      Latest Ref  Rng & Units 03/09/2024    3:13 AM 03/08/2024    4:54 AM 03/06/2024    3:40 AM  CBC  WBC 4.0 - 10.5 K/uL 9.3  9.7  11.1   Hemoglobin 12.0 - 15.0 g/dL 87.3  86.8  86.8   Hematocrit 36.0 - 46.0 % 39.0  40.8  41.9   Platelets 150 - 400 K/uL 176  175  179       Latest Ref Rng & Units 03/09/2024    3:13 AM 03/08/2024    4:54 AM 03/06/2024    2:24 AM  BMP  Glucose 70 - 99 mg/dL 869  889  847   BUN 8 - 23 mg/dL 32  24  24   Creatinine 0.44 - 1.00 mg/dL 8.21  8.36  8.13   Sodium 135 - 145 mmol/L 138  140  140   Potassium 3.5 - 5.1 mmol/L 4.8  4.1  4.5   Chloride 98 - 111 mmol/L 105  106  100   CO2 22 - 32 mmol/L 21  23  23    Calcium 8.9 - 10.3 mg/dL 9.1  9.5  9.8      Family Communication: n/a  Disposition: Status is: Observation   Planned Discharge Destination: Skilled nursing facility    Time spent: 35 minutes  Author: MDALA-GAUSI, [name], MD 03/09/2024 1:09 PM  For on call review [email or web address].

## 2024-03-09 NOTE — NC FL2 (Signed)
 [place]  MEDICAID FL2 LEVEL OF CARE FORM     IDENTIFICATION  Patient Name: Kirsten Kelly Birthdate: [birth year] Sex: female Admission Date (Current Location): 03/06/2024  [facility] and [place] Number:  Producer, television/film/video and Address:  [facility], [address], [place], [place] [address]      Provider Number: [id number]  Attending Physician Name and Address:  Jearlean, Masiku Agat*  Relative Name and Phone Number:  Rolfe Laundry (Other)  [phone]    Current Level of Care: Hospital Recommended Level of Care: Skilled Nursing Facility Prior Approval Number:    Date Approved/Denied:   PASRR Number: [id number]  Discharge Plan: SNF    Current Diagnoses: Patient Active Problem List   Diagnosis Date Noted   Syncope 10/12/2022   Fall at home, initial encounter 10/12/2022   Rhabdomyolysis 10/12/2022   AKI (acute kidney injury) (HCC) 10/12/2022   Low back pain 10/12/2022   Hyperlipidemia 10/12/2022   Constipation 10/12/2022   Depression 10/02/2016   HTN (hypertension) 10/02/2016   History of depression    Controlled type 2 diabetes mellitus without complication, without long-term current use of insulin  (HCC)    Closed fracture of right proximal humerus    Proximal humerus fracture 09/28/2016   Type 2 diabetes mellitus (HCC) 01/25/2011    Orientation RESPIRATION BLADDER Height & Weight     Self, Time, Situation, Place  Normal (Room Air) Incontinent Weight: 155 lb 3.3 oz (70.4 kg) Height:  5' 6 (167.6 cm)  BEHAVIORAL SYMPTOMS/MOOD NEUROLOGICAL BOWEL NUTRITION STATUS      Continent Diet (See discharge summary)  AMBULATORY STATUS COMMUNICATION OF NEEDS Skin   Limited Assist Verbally Normal                       Personal Care Assistance Level of Assistance  Bathing, Feeding, Dressing Bathing Assistance: Limited assistance Feeding assistance: Limited assistance Dressing Assistance: Limited assistance     Functional  Limitations Info  Hearing Sight Info: Adequate Hearing Info: Impaired (R and L) Speech Info: Adequate    SPECIAL CARE FACTORS FREQUENCY  PT (By licensed PT), OT (By licensed OT), Speech therapy     PT Frequency: 5x OT Frequency: 5x     Speech Therapy Frequency: 3x      Contractures Contractures Info: Not present    Additional Factors Info  Code Status, Allergies, Psychotropic Code Status Info: Full Allergies Info: NKA Psychotropic Info: Zoloft          Current Medications (03/09/2024):  This is the current hospital active medication list Current Facility-Administered Medications  Medication Dose Route Frequency Provider Last Rate Last Admin   acetaminophen  (TYLENOL ) tablet 650 mg  650 mg Oral Q6H PRN Darci Pore, MD   650 mg at 03/07/24 1236   amLODipine  (NORVASC ) tablet 10 mg  10 mg Oral Daily Georgina Basket, MD   10 mg at 03/09/24 9251   donepezil  (ARICEPT ) tablet 5 mg  5 mg Oral QHS Ogbata, Sylvester I, MD   5 mg at 03/09/24 0149   insulin  aspart (novoLOG ) injection 0-15 Units  0-15 Units Subcutaneous TID WC Sreeram, Narendranath, MD   2 Units at 03/09/24 0748   insulin  aspart (novoLOG ) injection 0-5 Units  0-5 Units Subcutaneous QHS Sreeram, Narendranath, MD       Oral care mouth rinse  15 mL Mouth Rinse PRN Sreeram, Narendranath, MD       sertraline  (ZOLOFT ) tablet 150 mg  150 mg Oral Daily Georgina Basket, MD  150 mg at 03/09/24 0747     Discharge Medications: Please see discharge summary for a list of discharge medications.  Relevant Imaging Results:  Relevant Lab Results:   Additional Information SS#: [id number]  Lauraine FORBES Saa, LCSWA

## 2024-03-09 NOTE — Plan of Care (Signed)
  Problem: Health Behavior/Discharge Planning: Goal: Ability to manage health-related needs will improve Outcome: Progressing   Problem: Clinical Measurements: Goal: Will remain free from infection Outcome: Progressing   Problem: Activity: Goal: Risk for activity intolerance will decrease Outcome: Adequate for Discharge

## 2024-03-09 NOTE — Progress Notes (Signed)
 Patient alert to place, time, and situation. Became forgetful of time, place, and situation x2. She thought she was at work. Re-orients quickly to x3. Remained pleasant through out shift.

## 2024-03-09 NOTE — TOC Progression Note (Signed)
 Transition of Care [facility]) - Progression Note    Patient Details  Name: Kirsten Kelly MRN: [id number] Date of Birth: 02/06/1928  [facility] of [facility]) CM/SW Contact  Lauraine FORBES Saa, LCSWA Phone Number: 03/09/2024, 4:11 PM  Clinical Narrative:     4:11 PM CSW emailed patient's friend, Theme park manager (patient has diagnosis of moderate dementia) patient's current SNF options ([address], [address], [address], [place], [facility], [facility], [facility], [address] [place], [place], [place], [place]) and their [facility]. CSW called Rosetta to inform her of email with SNF options. Rosetta inquired about [facility]. CSW informed Rosetta that [facility] offered a bed. Rosetta expressed interest in [facility] [facility] due to location and declined CSW offer of reviewing additional SNFs via email. CSW verbalized [facility] SNF and [facility] SN[facility] Medicare ratings. Rosetta continued to express preference in [place]. CSW informed SNF of bed acceptance and submitted insurance authorization, which was approved for 03/10/2024-03/12/2024.  Expected Discharge Plan: Skilled Nursing Facility Barriers to Discharge: Continued Medical Work up               Expected Discharge Plan and Services In-house Referral: Clinical Social Work   Post Acute Care Choice: Skilled Nursing Facility Living arrangements for the past 2 months: Mobile Home                   DME Agency: [employer] Agency: Other - See comment [facility])         Social Drivers of Health (SDOH) Interventions SDOH Screenings   Food Insecurity: Food Insecurity Present (03/06/2024)  Housing: Unknown (03/06/2024)  Transportation Needs: Unmet Transportation Needs (03/06/2024)  Utilities: Not At Risk (03/06/2024)  Financial Resource Strain: Low Risk  (06/18/2020)   Received from [facility]  Social Connections: Moderately Isolated  (03/06/2024)  Tobacco Use: Low Risk  (03/06/2024)    Readmission Risk Interventions     No data to display

## 2024-03-09 NOTE — Care Management Obs Status (Signed)
 MEDICARE OBSERVATION STATUS NOTIFICATION   Patient Details  Name: Kirsten Kelly MRN: [id number] Date of Birth: 04-14-1928   Medicare Observation Status Notification Given:  Yes   I, Jon Cruel, verbally reviewed observation notice.  03/07/2024, 10:39 AM     Claretta Deed 03/09/2024, 12:52 PM

## 2024-03-09 NOTE — Progress Notes (Signed)
 Physical Therapy Treatment Patient Details Name: Kirsten Kelly MRN: [id number] DOB: 1927/11/30 Today's Date: 03/09/2024   History of Present Illness 89 y.o. female adm 03/06/24 after fall outside her house, found by neighbor.  PMH includes: DM, HTN, arthritis.    PT Comments  Patient is agreeable to PT session. She was seated in the chair and needed Min A to stand from the chair to achieve upright position. Patient ambulated in hallway with rolling walker with improved balance and gait pattern compared to without device. Recommend to use the rolling walker for safety and fall prevention. PT will continue to follow to maximize independence and decrease caregiver burden. She could benefit from rehabilitation < 3 hours/day after this hospital stay. She would need supervision/assistance for safe return home.    If plan is discharge home, recommend the following: A little help with walking and/or transfers;A little help with bathing/dressing/bathroom;Assistance with cooking/housework;Direct supervision/assist for medications management;Direct supervision/assist for financial management;Assist for transportation;Help with stairs or ramp for entrance;Supervision due to cognitive status   Can travel by private vehicle     Yes  Equipment Recommendations  Rolling walker (2 wheels)    Recommendations for Other Services       Precautions / Restrictions Precautions Precautions: Fall Recall of Precautions/Restrictions: Impaired Restrictions Weight Bearing Restrictions Per Provider Order: No     Mobility  Bed Mobility               General bed mobility comments: not assessed as patient sitting up on arrival and post session    Transfers Overall transfer level: Needs assistance Equipment used: Rolling walker (2 wheels) Transfers: Sit to/from Stand Sit to Stand: Min assist           General transfer comment: assistance for anterior weight shifting. cues for technique. decreased  eccentric control    Ambulation/Gait Ambulation/Gait assistance: Contact guard assist Gait Distance (Feet): 100 Feet Assistive device: Rolling walker (2 wheels) Gait Pattern/deviations: Step-through pattern Gait velocity: decreased     General Gait Details: encouraged rolling walker for safety with education on proper use. balance improved with rolling walker compared to prior visit without rolling walker. patient seems agreeable to use at home and she reports she has one already. no dysnpea with exertion   Stairs             Wheelchair Mobility     Tilt Bed    Modified Rankin (Stroke Patients Only)       Balance Overall balance assessment: Needs assistance Sitting-balance support: No upper extremity supported, Feet supported Sitting balance-Leahy Scale: Good     Standing balance support: Bilateral upper extremity supported Standing balance-Leahy Scale: Poor Standing balance comment: using rolling walker for support in standing                            Communication Communication Communication: No apparent difficulties  Cognition Arousal: Alert Behavior During Therapy: WFL for tasks assessed/performed   PT - Cognitive impairments: History of cognitive impairments                       PT - Cognition Comments: cooperative throughout Following commands: Intact      Cueing Cueing Techniques: Verbal cues  Exercises      General Comments        Pertinent Vitals/Pain Pain Assessment Pain Assessment: No/denies pain    Home Living  Prior Function            PT Goals (current goals can now be found in the care plan section) Acute Rehab PT Goals Patient Stated Goal: to return home PT Goal Formulation: With patient Time For Goal Achievement: 03/20/24 Potential to Achieve Goals: Good Progress towards PT goals: Progressing toward goals    Frequency    Min 1X/week      PT Plan       Co-evaluation              AM-PAC PT 6 Clicks Mobility   Outcome Measure  Help needed turning from your back to your side while in a flat bed without using bedrails?: A Little Help needed moving from lying on your back to sitting on the side of a flat bed without using bedrails?: A Little Help needed moving to and from a bed to a chair (including a wheelchair)?: A Little Help needed standing up from a chair using your arms (e.g., wheelchair or bedside chair)?: A Little Help needed to walk in hospital room?: A Little Help needed climbing 3-5 steps with a railing? : A Lot 6 Click Score: 17    End of Session   Activity Tolerance: Patient tolerated treatment well Patient left: in chair;with call bell/phone within reach;with chair alarm set Nurse Communication: Mobility status PT Visit Diagnosis: Muscle weakness (generalized) (M62.81);History of falling (Z91.81);Difficulty in walking, not elsewhere classified (R26.2)     Time: 8865-8853 PT Time Calculation (min) (ACUTE ONLY): 12 min  Charges:    $Therapeutic Activity: 8-22 mins PT General Charges $$ ACUTE PT VISIT: 1 Visit                     Kirsten Kelly, PT, MPT    Kirsten Kelly 03/09/2024, 11:53 AM

## 2024-03-10 DIAGNOSIS — N179 Acute kidney failure, unspecified: Secondary | ICD-10-CM | POA: Diagnosis not present

## 2024-03-10 LAB — GLUCOSE, CAPILLARY
Glucose-Capillary: 118 mg/dL — ABNORMAL HIGH (ref 70–99)
Glucose-Capillary: 163 mg/dL — ABNORMAL HIGH (ref 70–99)

## 2024-03-10 LAB — BASIC METABOLIC PANEL WITH GFR
Anion gap: 10 (ref 5–15)
BUN: 37 mg/dL — ABNORMAL HIGH (ref 8–23)
CO2: 23 mmol/L (ref 22–32)
Calcium: 9.5 mg/dL (ref 8.9–10.3)
Chloride: 105 mmol/L (ref 98–111)
Creatinine, Ser: 1.6 mg/dL — ABNORMAL HIGH (ref 0.44–1.00)
GFR, Estimated: 30 mL/min — ABNORMAL LOW (ref >60)
Glucose, Bld: 126 mg/dL — ABNORMAL HIGH (ref 70–99)
Potassium: 4.1 mmol/L (ref 3.5–5.1)
Sodium: 138 mmol/L (ref 135–145)

## 2024-03-10 NOTE — TOC Transition Note (Signed)
 Transition of Care [facility]) - Discharge Note   Patient Details  Name: Kirsten Kelly MRN: [id number] Date of Birth: 03/10/1928  Transition of Care [facility]) CM/SW Contact:  Lauraine FORBES Saa, LCSWA Phone Number: 03/10/2024, 11:29 AM   Clinical Narrative:     Patient will DC to: [facility] SNF LTC Anticipated DC date: 03/10/2024 Family notified: Darvin Geralds; Other/Friend; [phone] Transport by: [facility]   Per MD patient ready for DC to [facility]. RN to call report prior to discharge [phone]). RN, patient, patient's family, and facility notified of DC. Discharge Summary and FL2 sent to facility. DC packet on chart. Ambulance transport requested for patient at 14:00 per SNF request.   CSW will sign off for now as social work intervention is no longer needed. Please consult us  again if new needs arise.    Final next level of care: Skilled Nursing Facility Barriers to Discharge: Barriers Resolved   Patient Goals and CMS Choice   CMS Medicare.gov Compare Post Acute Care list provided to:: Patient Represenative (must comment) (Rosetta Geralds; Other/Friend; [phone]) Choice offered to / list presented to : NA (Rosetta Adak; Other/Friend; [phone])      Discharge Placement              Patient chooses bed at: [facility] Patient to be transferred to facility by: [facility] Name of family member notified: Darvin Geralds; Other/Friend; [phone] Patient and family notified of of transfer: 03/10/24  Discharge Plan and Services Additional resources added to the After Visit Summary for   In-house Referral: Clinical Social Work   Post Acute Care Choice: Skilled Nursing Facility            DME Agency: [employer]         HH Agency: Other - See comment [facility])        Social Drivers of Health (SDOH) Interventions SDOH Screenings   Food Insecurity: Food Insecurity Present (03/06/2024)  Housing: Unknown (03/06/2024)   Transportation Needs: Unmet Transportation Needs (03/06/2024)  Utilities: Not At Risk (03/06/2024)  Financial Resource Strain: Low Risk  (06/18/2020)   Received from [facility]  Social Connections: Moderately Isolated (03/06/2024)  Tobacco Use: Low Risk  (03/06/2024)     Readmission Risk Interventions     No data to display

## 2024-03-10 NOTE — Discharge Summary (Signed)
 Physician Discharge Summary   Patient: Kirsten Kelly MRN: [id number] DOB: [birth year]  Admit date:     03/06/2024  Discharge date: 03/10/24  Discharge Physician: MDALA-GAUSI, GOLDEN PILLOW   PCP: Teresa Channel, MD   Recommendations at discharge:   Follow-up with PCP  Discharge Diagnoses: Principal Problem:   AKI (acute kidney injury) (HCC)  Resolved Problems:   * No resolved hospital problems. *  Hospital Course: 89 year old woman with PMH of HTN, T2DM, cognitive impairment, CKD stage III who who presented with a fall and acute metabolic encephalopathy, AKI on CKD.   The hospital course is in problem-based format below:    Assessment and Plan:  Acute metabolic encephalopathy With baseline cognitive impairment. UA was not suggestive of infection. Possibly metabolic encephalopathy due to AKI. Encephalopathy improved with improvement in her creatinine. Mental status is back to baseline by the day of discharge.   Cognitive impairment Home donepezil  was continued. Patient was seen by PT and SNF was recommended.   AKI on CKD stage III Likely prerenal due to volume depletion. Patient presented with creatinine of 1.86 from baseline of approximately 1.4. Home chlorthalidone and Aldactone were discontinued and the patient was given IV fluids. Creatinine trended down and was 1.6 on the day of discharge.  Hypertension Home Norvasc  was continued.   Mood disorder Home Zoloft  was continued.      Consultants: n/a Procedures performed: n/a  Disposition: Skilled nursing facility Diet recommendation:  Discharge Diet Orders (From admission, onward)     Start     Ordered   03/10/24 0000  Diet Carb Modified        03/10/24 0928           Carb modified diet DISCHARGE MEDICATION: Allergies as of 03/10/2024   No Known Allergies      Medication List     STOP taking these medications    chlorthalidone 25 MG tablet Commonly known as: HYGROTON   guaiFENesin 600 MG  12 hr tablet Commonly known as: MUCINEX   spironolactone 50 MG tablet Commonly known as: ALDACTONE       TAKE these medications    acetaminophen  325 MG tablet Commonly known as: TYLENOL  Take 650 mg by mouth daily as needed for moderate pain, fever or headache.   amLODipine  10 MG tablet Commonly known as: NORVASC  Take 10 mg by mouth daily.   donepezil  5 MG tablet Commonly known as: ARICEPT  Take 5 mg by mouth at bedtime.   Ensure Take 237 mLs by mouth 2 (two) times daily as needed (meal replacement).   sertraline  100 MG tablet Commonly known as: ZOLOFT  Take 150 mg by mouth daily.        Discharge Exam: Filed Weights   03/06/24 1320  Weight: 70.4 kg   Physical Exam   General: Alert, oriented X 2 Eyes: Pupils equal, reactive  Oral cavity: moist mucous membranes  Head: Atraumatic, normocephalic  Neck: supple  Chest: clear to auscultation. No crackles, no wheezes  CVS: S1,S2 RRR. No murmurs  Abd: No distention, soft, non-tender. No masses palpable  Extr: No edema   MSK: No joint deformities or swelling  Neurological: Grossly intact.    Condition at discharge: stable  The results of significant diagnostics from this hospitalization (including imaging, microbiology, ancillary and laboratory) are listed below for reference.   Imaging Studies: CT Head Wo Contrast Result Date: 03/06/2024 CLINICAL DATA:  Neck trauma (Age >= 65y); Mental status change, unknown cause. Fall EXAM: CT HEAD WITHOUT CONTRAST CT CERVICAL  SPINE WITHOUT CONTRAST TECHNIQUE: Multidetector CT imaging of the head and cervical spine was performed following the standard protocol without intravenous contrast. Multiplanar CT image reconstructions of the cervical spine were also generated. RADIATION DOSE REDUCTION: This exam was performed according to the departmental dose-optimization program which includes automated exposure control, adjustment of the mA and/or kV according to patient size and/or use of  iterative reconstruction technique. COMPARISON:  CT head 02/22/2023 FINDINGS: CT HEAD FINDINGS Brain: Cerebral ventricle sizes are concordant with the degree of cerebral volume loss. Patchy and confluent areas of decreased attenuation are noted throughout the deep and periventricular white matter of the cerebral hemispheres bilaterally, compatible with chronic microvascular ischemic disease. No evidence of large-territorial acute infarction. No parenchymal hemorrhage. No mass lesion. No extra-axial collection. No mass effect or midline shift. No hydrocephalus. Basilar cisterns are patent. Vascular: No hyperdense vessel. Atherosclerotic calcifications are present within the cavernous internal carotid arteries. Skull: No acute fracture or focal lesion. Sinuses/Orbits: Paranasal sinuses and mastoid air cells are clear. Bilateral lens replacement. The orbits are unremarkable. Other: None. CT CERVICAL SPINE FINDINGS Alignment: Grade 1 anterolisthesis of C3 on C4. Skull base and vertebrae: Multilevel severe degenerative changes of the spine. No acute fracture. No aggressive appearing focal osseous lesion or focal pathologic process. Soft tissues and spinal canal: No prevertebral fluid or swelling. No visible canal hematoma. Upper chest: Biapical pleural/pulmonary scarring. Emphysematous changes. Other: None. IMPRESSION: 1. No acute intracranial abnormality. 2. No acute displaced fracture or traumatic listhesis of the cervical spine. Electronically Signed   By: Morgane  Naveau M.D.   On: 03/06/2024 03:16   CT Cervical Spine Wo Contrast Result Date: 03/06/2024 CLINICAL DATA:  Neck trauma (Age >= 65y); Mental status change, unknown cause. Fall EXAM: CT HEAD WITHOUT CONTRAST CT CERVICAL SPINE WITHOUT CONTRAST TECHNIQUE: Multidetector CT imaging of the head and cervical spine was performed following the standard protocol without intravenous contrast. Multiplanar CT image reconstructions of the cervical spine were also  generated. RADIATION DOSE REDUCTION: This exam was performed according to the departmental dose-optimization program which includes automated exposure control, adjustment of the mA and/or kV according to patient size and/or use of iterative reconstruction technique. COMPARISON:  CT head 02/22/2023 FINDINGS: CT HEAD FINDINGS Brain: Cerebral ventricle sizes are concordant with the degree of cerebral volume loss. Patchy and confluent areas of decreased attenuation are noted throughout the deep and periventricular white matter of the cerebral hemispheres bilaterally, compatible with chronic microvascular ischemic disease. No evidence of large-territorial acute infarction. No parenchymal hemorrhage. No mass lesion. No extra-axial collection. No mass effect or midline shift. No hydrocephalus. Basilar cisterns are patent. Vascular: No hyperdense vessel. Atherosclerotic calcifications are present within the cavernous internal carotid arteries. Skull: No acute fracture or focal lesion. Sinuses/Orbits: Paranasal sinuses and mastoid air cells are clear. Bilateral lens replacement. The orbits are unremarkable. Other: None. CT CERVICAL SPINE FINDINGS Alignment: Grade 1 anterolisthesis of C3 on C4. Skull base and vertebrae: Multilevel severe degenerative changes of the spine. No acute fracture. No aggressive appearing focal osseous lesion or focal pathologic process. Soft tissues and spinal canal: No prevertebral fluid or swelling. No visible canal hematoma. Upper chest: Biapical pleural/pulmonary scarring. Emphysematous changes. Other: None. IMPRESSION: 1. No acute intracranial abnormality. 2. No acute displaced fracture or traumatic listhesis of the cervical spine. Electronically Signed   By: Morgane  Naveau M.D.   On: 03/06/2024 03:16    Microbiology: Results for orders placed or performed during the hospital encounter of 09/21/16  Urine culture  Status: None   Collection Time: 09/21/16  2:31 PM   Specimen: Urine   Result Value Ref Range Status   Specimen Description Urine  Final   Special Requests NONE  Final   Culture   Final    NO GROWTH Performed at [facility], [address] [place], [place] [address]    Report Status 09/22/2016 FINAL  Final    Labs: CBC: Recent Labs  Lab 03/06/24 0340 03/08/24 0454 03/09/24 0313  WBC 11.1* 9.7 9.3  HGB 13.1 13.1 12.6  HCT 41.9 40.8 39.0  MCV 90.9 88.3 89.0  PLT 179 175 176   Basic Metabolic Panel: Recent Labs  Lab 03/06/24 0224 03/08/24 0454 03/09/24 0313 03/10/24 0616  NA 140 140 138 138  K 4.5 4.1 4.8 4.1  CL 100 106 105 105  CO2 23 23 21* 23  GLUCOSE 152* 110* 130* 126*  BUN 24* 24* 32* 37*  CREATININE 1.86* 1.63* 1.78* 1.60*  CALCIUM 9.8 9.5 9.1 9.5   Liver Function Tests: Recent Labs  Lab 03/06/24 0224  AST 37  ALT 18  ALKPHOS 59  BILITOT 1.0  PROT 8.2*  ALBUMIN 3.9   CBG: Recent Labs  Lab 03/09/24 0817 03/09/24 1148 03/09/24 1628 03/09/24 1955 03/10/24 0718  GLUCAP 218* 111* 134* 120* 118*    Discharge time spent: greater than 30 minutes.  Signed: MDALA-GAUSI, Avary Eichenberger AGATHA, MD [facility] 03/10/2024

## 2024-03-10 NOTE — Plan of Care (Signed)
  Problem: Skin Integrity: Goal: Risk for impaired skin integrity will decrease Outcome: Progressing   Problem: Coping: Goal: Ability to adjust to condition or change in health will improve Outcome: Progressing   Problem: Pain Managment: Goal: General experience of comfort will improve and/or be controlled Outcome: Progressing   Problem: Coping: Goal: Level of anxiety will decrease Outcome: Progressing

## 2024-03-10 NOTE — Progress Notes (Signed)
 Mobility Specialist Progress Note:    03/10/24 1005  Mobility  Activity Ambulated with assistance (In hallway)  Level of Assistance Contact guard assist, steadying assist  Assistive Device Front wheel walker  Distance Ambulated (ft) 250 ft  Activity Response Tolerated well  Mobility Referral Yes  Mobility visit 1 Mobility  Mobility Specialist Start Time (ACUTE ONLY) [id number]  Mobility Specialist Stop Time (ACUTE ONLY) [id number]  Mobility Specialist Time Calculation (min) (ACUTE ONLY) 14 min   Received pt in bed and agreeable to mobility. No physical assistance needed. No c/o. Returned to room without fault. Assisted pt in changing into personal clothes. Left pt in chair with alarm on. Personal belongings and call light within reach. All needs met.  Lavanda Pollack Mobility Specialist  Please contact via [employer] or  Rehab Office [phone]

## 2024-04-01 DIAGNOSIS — N1831 Chronic kidney disease, stage 3a: Secondary | ICD-10-CM | POA: Diagnosis not present

## 2024-04-01 DIAGNOSIS — M810 Age-related osteoporosis without current pathological fracture: Secondary | ICD-10-CM | POA: Diagnosis not present

## 2024-04-01 DIAGNOSIS — E1142 Type 2 diabetes mellitus with diabetic polyneuropathy: Secondary | ICD-10-CM | POA: Diagnosis not present

## 2024-04-01 DIAGNOSIS — F0390 Unspecified dementia without behavioral disturbance: Secondary | ICD-10-CM | POA: Diagnosis not present

## 2024-04-01 DIAGNOSIS — Z23 Encounter for immunization: Secondary | ICD-10-CM | POA: Diagnosis not present

## 2024-04-01 DIAGNOSIS — F325 Major depressive disorder, single episode, in full remission: Secondary | ICD-10-CM | POA: Diagnosis not present

## 2024-04-01 DIAGNOSIS — I129 Hypertensive chronic kidney disease with stage 1 through stage 4 chronic kidney disease, or unspecified chronic kidney disease: Secondary | ICD-10-CM | POA: Diagnosis not present

## 2024-04-01 DIAGNOSIS — Z8739 Personal history of other diseases of the musculoskeletal system and connective tissue: Secondary | ICD-10-CM | POA: Diagnosis not present

## 2024-04-14 ENCOUNTER — Ambulatory Visit (INDEPENDENT_AMBULATORY_CARE_PROVIDER_SITE_OTHER): Admitting: Podiatry

## 2024-04-14 ENCOUNTER — Ambulatory Visit: Admitting: Podiatry

## 2024-04-14 DIAGNOSIS — M79609 Pain in unspecified limb: Secondary | ICD-10-CM

## 2024-04-14 DIAGNOSIS — B351 Tinea unguium: Secondary | ICD-10-CM

## 2024-04-14 DIAGNOSIS — E119 Type 2 diabetes mellitus without complications: Secondary | ICD-10-CM

## 2024-04-14 NOTE — Progress Notes (Signed)
This patient returns to my office for at risk foot care.  This patient requires this care by a professional since this patient will be at risk due to having diabetes mellitus.    This patient is unable to cut nails herself since the patient cannot reach her nails.These nails are painful walking and wearing shoes.  This patient presents for at risk foot care today.   General Appearance  Alert, conversant and in no acute stress.  Vascular  Dorsalis pedis and posterior tibial  pulses are palpable  bilaterally.  Capillary return is within normal limits  bilaterally. Temperature is within normal limits  bilaterally.  Neurologic  Senn-Weinstein monofilament wire test diminished   bilaterally. Muscle power within normal limits bilaterally.  Nails Thick disfigured discolored nails with subungual debris  from hallux to fifth toes bilaterally. No evidence of bacterial infection or drainage bilaterally.  Orthopedic  No limitations of motion  feet .  No crepitus or effusions noted.  No bony pathology or digital deformities noted.  HAV  B/L.  Skin  normotropic skin with no porokeratosis noted bilaterally.  No signs of infections or ulcers noted.     Onychomycosis  Pain in right toes  Pain in left toes  Consent was obtained for treatment procedures.   Mechanical debridement of nails 1-5  bilaterally performed with a nail nipper.  Filed with dremel without incident.    Return office visit   3 months                 Told patient to return for periodic foot care and evaluation due to potential at risk complications.   Lauri Till DPM  

## 2024-07-14 ENCOUNTER — Ambulatory Visit: Admitting: Podiatry
